# Patient Record
Sex: Female | Born: 1935 | Race: White | Hispanic: No | Marital: Married | Smoking: Never smoker
Health system: Southern US, Community
[De-identification: ages and names within clinical notes are randomized; demographics above are authoritative.]

## PROBLEM LIST (undated history)

## (undated) DIAGNOSIS — N183 Chronic kidney disease, stage 3 (moderate): Secondary | ICD-10-CM

## (undated) DIAGNOSIS — J189 Pneumonia, unspecified organism: Secondary | ICD-10-CM

## (undated) DIAGNOSIS — I10 Essential (primary) hypertension: Secondary | ICD-10-CM

## (undated) DIAGNOSIS — E039 Hypothyroidism, unspecified: Secondary | ICD-10-CM

## (undated) DIAGNOSIS — E079 Disorder of thyroid, unspecified: Secondary | ICD-10-CM

## (undated) DIAGNOSIS — D509 Iron deficiency anemia, unspecified: Secondary | ICD-10-CM

## (undated) DIAGNOSIS — I519 Heart disease, unspecified: Secondary | ICD-10-CM

## (undated) DIAGNOSIS — B3781 Candidal esophagitis: Secondary | ICD-10-CM

## (undated) DIAGNOSIS — I5042 Chronic combined systolic (congestive) and diastolic (congestive) heart failure: Secondary | ICD-10-CM

## (undated) DIAGNOSIS — E785 Hyperlipidemia, unspecified: Secondary | ICD-10-CM

## (undated) HISTORY — PX: TONSILLECTOMY: SUR1361

## (undated) HISTORY — PX: WASH SINUS: SUR1443

## (undated) HISTORY — DX: Iron deficiency anemia, unspecified: D50.9

## (undated) HISTORY — DX: Chronic combined systolic (congestive) and diastolic (congestive) heart failure: I50.42

## (undated) HISTORY — DX: Hypothyroidism, unspecified: E03.9

## (undated) HISTORY — DX: Pneumonia, unspecified organism: J18.9

## (undated) HISTORY — DX: Heart disease, unspecified: I51.9

---

## 1999-02-10 ENCOUNTER — Other Ambulatory Visit: Admission: RE | Admit: 1999-02-10 | Discharge: 1999-02-10 | Payer: Self-pay | Admitting: Radiology

## 2002-08-27 ENCOUNTER — Ambulatory Visit (HOSPITAL_COMMUNITY): Admission: RE | Admit: 2002-08-27 | Discharge: 2002-08-27 | Payer: Self-pay | Admitting: Family Medicine

## 2002-08-27 ENCOUNTER — Encounter: Payer: Self-pay | Admitting: Family Medicine

## 2004-02-25 ENCOUNTER — Ambulatory Visit (HOSPITAL_COMMUNITY): Admission: RE | Admit: 2004-02-25 | Discharge: 2004-02-25 | Payer: Self-pay | Admitting: Family Medicine

## 2004-09-18 ENCOUNTER — Emergency Department (HOSPITAL_COMMUNITY): Admission: EM | Admit: 2004-09-18 | Discharge: 2004-09-18 | Payer: Self-pay | Admitting: *Deleted

## 2005-05-17 ENCOUNTER — Ambulatory Visit (HOSPITAL_COMMUNITY): Admission: RE | Admit: 2005-05-17 | Discharge: 2005-05-17 | Payer: Self-pay | Admitting: Family Medicine

## 2005-05-25 ENCOUNTER — Ambulatory Visit: Payer: Self-pay | Admitting: Orthopedic Surgery

## 2005-10-05 ENCOUNTER — Ambulatory Visit (HOSPITAL_COMMUNITY): Admission: RE | Admit: 2005-10-05 | Discharge: 2005-10-05 | Payer: Self-pay | Admitting: Family Medicine

## 2007-04-22 ENCOUNTER — Ambulatory Visit (HOSPITAL_COMMUNITY): Admission: RE | Admit: 2007-04-22 | Discharge: 2007-04-22 | Payer: Self-pay | Admitting: Family Medicine

## 2008-04-22 ENCOUNTER — Ambulatory Visit (HOSPITAL_COMMUNITY): Admission: RE | Admit: 2008-04-22 | Discharge: 2008-04-22 | Payer: Self-pay | Admitting: Family Medicine

## 2009-04-26 ENCOUNTER — Ambulatory Visit (HOSPITAL_COMMUNITY): Admission: RE | Admit: 2009-04-26 | Discharge: 2009-04-26 | Payer: Self-pay | Admitting: Family Medicine

## 2010-05-19 ENCOUNTER — Ambulatory Visit (HOSPITAL_COMMUNITY)
Admission: RE | Admit: 2010-05-19 | Discharge: 2010-05-19 | Payer: Self-pay | Source: Home / Self Care | Attending: Family Medicine | Admitting: Family Medicine

## 2011-06-05 ENCOUNTER — Other Ambulatory Visit (HOSPITAL_COMMUNITY): Payer: Self-pay | Admitting: Family Medicine

## 2011-06-05 DIAGNOSIS — Z139 Encounter for screening, unspecified: Secondary | ICD-10-CM

## 2011-06-07 ENCOUNTER — Ambulatory Visit (HOSPITAL_COMMUNITY)
Admission: RE | Admit: 2011-06-07 | Discharge: 2011-06-07 | Disposition: A | Discharge status: Home / Self Care | Payer: Medicare Other | Source: Ambulatory Visit | Attending: Family Medicine | Admitting: Family Medicine

## 2011-06-07 DIAGNOSIS — Z1231 Encounter for screening mammogram for malignant neoplasm of breast: Secondary | ICD-10-CM | POA: Insufficient documentation

## 2011-06-07 DIAGNOSIS — Z139 Encounter for screening, unspecified: Secondary | ICD-10-CM

## 2011-09-18 ENCOUNTER — Ambulatory Visit (HOSPITAL_COMMUNITY)
Admission: RE | Admit: 2011-09-18 | Discharge: 2011-09-18 | Disposition: A | Discharge status: Home / Self Care | Payer: Medicare Other | Source: Ambulatory Visit | Attending: Family Medicine | Admitting: Family Medicine

## 2011-09-18 ENCOUNTER — Other Ambulatory Visit (HOSPITAL_COMMUNITY): Payer: Self-pay | Admitting: Family Medicine

## 2011-09-18 DIAGNOSIS — IMO0002 Reserved for concepts with insufficient information to code with codable children: Secondary | ICD-10-CM | POA: Insufficient documentation

## 2011-09-18 DIAGNOSIS — M25569 Pain in unspecified knee: Secondary | ICD-10-CM | POA: Insufficient documentation

## 2011-09-18 DIAGNOSIS — M1710 Unilateral primary osteoarthritis, unspecified knee: Secondary | ICD-10-CM | POA: Insufficient documentation

## 2013-05-20 ENCOUNTER — Ambulatory Visit (HOSPITAL_COMMUNITY)
Admission: RE | Admit: 2013-05-20 | Discharge: 2013-05-20 | Disposition: A | Discharge status: Home / Self Care | Payer: Medicare Other | Source: Ambulatory Visit | Attending: Family Medicine | Admitting: Family Medicine

## 2013-05-20 ENCOUNTER — Other Ambulatory Visit (HOSPITAL_COMMUNITY): Payer: Self-pay | Admitting: Family Medicine

## 2013-05-20 DIAGNOSIS — M25569 Pain in unspecified knee: Secondary | ICD-10-CM | POA: Insufficient documentation

## 2013-05-20 DIAGNOSIS — M1990 Unspecified osteoarthritis, unspecified site: Secondary | ICD-10-CM

## 2013-05-20 DIAGNOSIS — M1710 Unilateral primary osteoarthritis, unspecified knee: Secondary | ICD-10-CM | POA: Insufficient documentation

## 2014-02-10 ENCOUNTER — Other Ambulatory Visit: Payer: Self-pay | Admitting: Family Medicine

## 2014-02-10 DIAGNOSIS — M545 Low back pain: Secondary | ICD-10-CM

## 2014-02-10 DIAGNOSIS — M79605 Pain in left leg: Principal | ICD-10-CM

## 2014-02-19 ENCOUNTER — Ambulatory Visit
Admission: RE | Admit: 2014-02-19 | Discharge: 2014-02-19 | Disposition: A | Discharge status: Home / Self Care | Payer: Medicare Other | Source: Ambulatory Visit | Attending: Family Medicine | Admitting: Family Medicine

## 2014-02-19 DIAGNOSIS — M545 Low back pain, unspecified: Secondary | ICD-10-CM

## 2014-02-19 DIAGNOSIS — M79605 Pain in left leg: Principal | ICD-10-CM

## 2015-05-07 ENCOUNTER — Other Ambulatory Visit (HOSPITAL_COMMUNITY): Payer: Self-pay | Admitting: Family Medicine

## 2015-05-07 DIAGNOSIS — M542 Cervicalgia: Secondary | ICD-10-CM

## 2015-05-11 ENCOUNTER — Ambulatory Visit (HOSPITAL_COMMUNITY)
Admission: RE | Admit: 2015-05-11 | Discharge: 2015-05-11 | Disposition: A | Discharge status: Home / Self Care | Payer: Medicare Other | Source: Ambulatory Visit | Attending: Family Medicine | Admitting: Family Medicine

## 2015-05-11 DIAGNOSIS — M542 Cervicalgia: Secondary | ICD-10-CM | POA: Insufficient documentation

## 2015-05-11 DIAGNOSIS — M4802 Spinal stenosis, cervical region: Secondary | ICD-10-CM | POA: Insufficient documentation

## 2015-05-11 DIAGNOSIS — M4322 Fusion of spine, cervical region: Secondary | ICD-10-CM | POA: Diagnosis not present

## 2015-05-19 ENCOUNTER — Ambulatory Visit (HOSPITAL_COMMUNITY): Payer: Medicare Other

## 2015-07-07 ENCOUNTER — Ambulatory Visit (HOSPITAL_COMMUNITY)
Admission: RE | Admit: 2015-07-07 | Discharge: 2015-07-07 | Disposition: A | Discharge status: Home / Self Care | Payer: Medicare Other | Source: Ambulatory Visit | Attending: Family Medicine | Admitting: Family Medicine

## 2015-07-07 ENCOUNTER — Other Ambulatory Visit (HOSPITAL_COMMUNITY): Payer: Self-pay | Admitting: Family Medicine

## 2015-07-07 DIAGNOSIS — M719 Bursopathy, unspecified: Secondary | ICD-10-CM | POA: Insufficient documentation

## 2015-07-07 DIAGNOSIS — M19011 Primary osteoarthritis, right shoulder: Secondary | ICD-10-CM

## 2016-05-18 ENCOUNTER — Other Ambulatory Visit (HOSPITAL_COMMUNITY): Payer: Self-pay | Admitting: Preventative Medicine

## 2016-05-18 DIAGNOSIS — R9389 Abnormal findings on diagnostic imaging of other specified body structures: Secondary | ICD-10-CM

## 2016-05-25 ENCOUNTER — Ambulatory Visit (HOSPITAL_COMMUNITY)
Admission: RE | Admit: 2016-05-25 | Discharge: 2016-05-25 | Disposition: A | Discharge status: Home / Self Care | Payer: Medicare Other | Source: Ambulatory Visit | Attending: Preventative Medicine | Admitting: Preventative Medicine

## 2016-05-25 DIAGNOSIS — C3431 Malignant neoplasm of lower lobe, right bronchus or lung: Secondary | ICD-10-CM | POA: Diagnosis not present

## 2016-05-25 DIAGNOSIS — J479 Bronchiectasis, uncomplicated: Secondary | ICD-10-CM | POA: Insufficient documentation

## 2016-05-25 DIAGNOSIS — R9389 Abnormal findings on diagnostic imaging of other specified body structures: Secondary | ICD-10-CM

## 2016-05-25 DIAGNOSIS — R938 Abnormal findings on diagnostic imaging of other specified body structures: Secondary | ICD-10-CM | POA: Insufficient documentation

## 2016-06-12 ENCOUNTER — Emergency Department (HOSPITAL_COMMUNITY): Payer: Medicare Other

## 2016-06-12 ENCOUNTER — Inpatient Hospital Stay (HOSPITAL_COMMUNITY)
Admission: EM | Admit: 2016-06-12 | Discharge: 2016-06-15 | DRG: 812 | Disposition: A | Discharge status: Home / Self Care | Payer: Medicare Other | Attending: Family Medicine | Admitting: Family Medicine

## 2016-06-12 ENCOUNTER — Encounter (HOSPITAL_COMMUNITY): Payer: Self-pay | Admitting: Emergency Medicine

## 2016-06-12 DIAGNOSIS — D649 Anemia, unspecified: Secondary | ICD-10-CM | POA: Diagnosis present

## 2016-06-12 DIAGNOSIS — N179 Acute kidney failure, unspecified: Secondary | ICD-10-CM | POA: Diagnosis present

## 2016-06-12 DIAGNOSIS — M6281 Muscle weakness (generalized): Secondary | ICD-10-CM

## 2016-06-12 DIAGNOSIS — R531 Weakness: Secondary | ICD-10-CM

## 2016-06-12 DIAGNOSIS — E860 Dehydration: Secondary | ICD-10-CM | POA: Diagnosis present

## 2016-06-12 DIAGNOSIS — D509 Iron deficiency anemia, unspecified: Principal | ICD-10-CM

## 2016-06-12 DIAGNOSIS — Z79899 Other long term (current) drug therapy: Secondary | ICD-10-CM

## 2016-06-12 DIAGNOSIS — J069 Acute upper respiratory infection, unspecified: Secondary | ICD-10-CM | POA: Diagnosis present

## 2016-06-12 DIAGNOSIS — K208 Other esophagitis: Secondary | ICD-10-CM | POA: Diagnosis present

## 2016-06-12 DIAGNOSIS — D75839 Thrombocytosis, unspecified: Secondary | ICD-10-CM | POA: Diagnosis present

## 2016-06-12 DIAGNOSIS — K648 Other hemorrhoids: Secondary | ICD-10-CM | POA: Diagnosis present

## 2016-06-12 DIAGNOSIS — B3781 Candidal esophagitis: Secondary | ICD-10-CM

## 2016-06-12 DIAGNOSIS — K2940 Chronic atrophic gastritis without bleeding: Secondary | ICD-10-CM | POA: Diagnosis present

## 2016-06-12 DIAGNOSIS — E538 Deficiency of other specified B group vitamins: Secondary | ICD-10-CM | POA: Diagnosis present

## 2016-06-12 DIAGNOSIS — E785 Hyperlipidemia, unspecified: Secondary | ICD-10-CM | POA: Diagnosis present

## 2016-06-12 DIAGNOSIS — K222 Esophageal obstruction: Secondary | ICD-10-CM | POA: Diagnosis present

## 2016-06-12 DIAGNOSIS — I10 Essential (primary) hypertension: Secondary | ICD-10-CM | POA: Diagnosis present

## 2016-06-12 DIAGNOSIS — Q438 Other specified congenital malformations of intestine: Secondary | ICD-10-CM

## 2016-06-12 DIAGNOSIS — D473 Essential (hemorrhagic) thrombocythemia: Secondary | ICD-10-CM | POA: Diagnosis present

## 2016-06-12 DIAGNOSIS — E039 Hypothyroidism, unspecified: Secondary | ICD-10-CM | POA: Diagnosis present

## 2016-06-12 HISTORY — DX: Essential (primary) hypertension: I10

## 2016-06-12 HISTORY — DX: Disorder of thyroid, unspecified: E07.9

## 2016-06-12 HISTORY — DX: Hyperlipidemia, unspecified: E78.5

## 2016-06-12 LAB — CBC
HCT: 25.5 % — ABNORMAL LOW (ref 36.0–46.0)
Hemoglobin: 7.9 g/dL — ABNORMAL LOW (ref 12.0–15.0)
MCH: 24.2 pg — ABNORMAL LOW (ref 26.0–34.0)
MCHC: 31 g/dL (ref 30.0–36.0)
MCV: 78 fL (ref 78.0–100.0)
PLATELETS: 519 10*3/uL — AB (ref 150–400)
RBC: 3.27 MIL/uL — ABNORMAL LOW (ref 3.87–5.11)
RDW: 16.7 % — AB (ref 11.5–15.5)
WBC: 7.2 10*3/uL (ref 4.0–10.5)

## 2016-06-12 LAB — URINALYSIS, ROUTINE W REFLEX MICROSCOPIC
BILIRUBIN URINE: NEGATIVE
Glucose, UA: NEGATIVE mg/dL
Hgb urine dipstick: NEGATIVE
Ketones, ur: NEGATIVE mg/dL
Nitrite: NEGATIVE
PH: 6 (ref 5.0–8.0)
Protein, ur: NEGATIVE mg/dL
SPECIFIC GRAVITY, URINE: 1.017 (ref 1.005–1.030)

## 2016-06-12 LAB — IRON AND TIBC
Iron: 7 ug/dL — ABNORMAL LOW (ref 28–170)
SATURATION RATIOS: 2 % — AB (ref 10.4–31.8)
TIBC: 420 ug/dL (ref 250–450)
UIBC: 413 ug/dL

## 2016-06-12 LAB — COMPREHENSIVE METABOLIC PANEL
ALBUMIN: 3.4 g/dL — AB (ref 3.5–5.0)
ALK PHOS: 56 U/L (ref 38–126)
ALT: 13 U/L — AB (ref 14–54)
AST: 18 U/L (ref 15–41)
Anion gap: 8 (ref 5–15)
BILIRUBIN TOTAL: 0.4 mg/dL (ref 0.3–1.2)
BUN: 26 mg/dL — AB (ref 6–20)
CALCIUM: 9.1 mg/dL (ref 8.9–10.3)
CO2: 25 mmol/L (ref 22–32)
CREATININE: 1.3 mg/dL — AB (ref 0.44–1.00)
Chloride: 103 mmol/L (ref 101–111)
GFR calc Af Amer: 41 mL/min — ABNORMAL LOW (ref >60)
GFR calc non Af Amer: 35 mL/min — ABNORMAL LOW (ref >60)
GLUCOSE: 117 mg/dL — AB (ref 65–99)
Potassium: 4.1 mmol/L (ref 3.5–5.1)
SODIUM: 136 mmol/L (ref 135–145)
TOTAL PROTEIN: 6.2 g/dL — AB (ref 6.5–8.1)

## 2016-06-12 LAB — VITAMIN B12: Vitamin B-12: 155 pg/mL — ABNORMAL LOW (ref 180–914)

## 2016-06-12 LAB — FERRITIN: Ferritin: 6 ng/mL — ABNORMAL LOW (ref 11–307)

## 2016-06-12 LAB — PREPARE RBC (CROSSMATCH)

## 2016-06-12 LAB — FOLATE: FOLATE: 25.2 ng/mL (ref >5.9)

## 2016-06-12 LAB — ABO/RH: ABO/RH(D): O POS

## 2016-06-12 LAB — RETICULOCYTES
RBC.: 3.05 MIL/uL — AB (ref 3.87–5.11)
RETIC COUNT ABSOLUTE: 51.9 10*3/uL (ref 19.0–186.0)
Retic Ct Pct: 1.7 % (ref 0.4–3.1)

## 2016-06-12 LAB — TSH: TSH: 3.339 u[IU]/mL (ref 0.350–4.500)

## 2016-06-12 MED ORDER — GABAPENTIN 300 MG PO CAPS
300.0000 mg | ORAL_CAPSULE | Freq: Every day | ORAL | Status: DC
  Administered 2016-06-12 – 2016-06-14 (×3): 300 mg via ORAL
  Filled 2016-06-12 (×3): qty 1

## 2016-06-12 MED ORDER — BISACODYL 5 MG PO TBEC
5.0000 mg | DELAYED_RELEASE_TABLET | Freq: Every day | ORAL | Status: DC | PRN
  Filled 2016-06-12 (×2): qty 1

## 2016-06-12 MED ORDER — ACETAMINOPHEN 650 MG RE SUPP: 650.0000 mg | Freq: Four times a day (QID) | RECTAL | Status: DC | PRN

## 2016-06-12 MED ORDER — HEPARIN SODIUM (PORCINE) 5000 UNIT/ML IJ SOLN
5000.0000 [IU] | Freq: Three times a day (TID) | INTRAMUSCULAR | Status: AC
  Administered 2016-06-12 – 2016-06-13 (×4): 5000 [IU] via SUBCUTANEOUS
  Filled 2016-06-12 (×4): qty 1

## 2016-06-12 MED ORDER — SODIUM CHLORIDE 0.9 % IV SOLN
10.0000 mL/h | Freq: Once | INTRAVENOUS | Status: AC
  Administered 2016-06-12: 10 mL/h via INTRAVENOUS

## 2016-06-12 MED ORDER — ACETAMINOPHEN 325 MG PO TABS: 650.0000 mg | ORAL_TABLET | Freq: Four times a day (QID) | ORAL | Status: DC | PRN

## 2016-06-12 MED ORDER — TRAZODONE HCL 50 MG PO TABS
25.0000 mg | ORAL_TABLET | Freq: Every evening | ORAL | Status: DC | PRN
  Administered 2016-06-12 – 2016-06-13 (×2): 25 mg via ORAL
  Filled 2016-06-12 (×2): qty 1

## 2016-06-12 MED ORDER — PHENOL 1.4 % MT LIQD
1.0000 | OROMUCOSAL | Status: DC | PRN
  Administered 2016-06-12 – 2016-06-14 (×2): 1 via OROMUCOSAL
  Filled 2016-06-12 (×2): qty 177

## 2016-06-12 MED ORDER — PRAVASTATIN SODIUM 40 MG PO TABS
40.0000 mg | ORAL_TABLET | Freq: Every day | ORAL | Status: DC
  Administered 2016-06-12 – 2016-06-15 (×4): 40 mg via ORAL
  Filled 2016-06-12 (×4): qty 1

## 2016-06-12 MED ORDER — ONDANSETRON HCL 4 MG/2ML IJ SOLN: 4.0000 mg | Freq: Four times a day (QID) | INTRAMUSCULAR | Status: DC | PRN

## 2016-06-12 MED ORDER — ONDANSETRON HCL 4 MG PO TABS: 4.0000 mg | ORAL_TABLET | Freq: Four times a day (QID) | ORAL | Status: DC | PRN

## 2016-06-12 MED ORDER — SODIUM CHLORIDE 0.9 % IV SOLN
INTRAVENOUS | Status: AC
  Administered 2016-06-12: 18:00:00 via INTRAVENOUS

## 2016-06-12 MED ORDER — LEVOTHYROXINE SODIUM 50 MCG PO TABS
50.0000 ug | ORAL_TABLET | Freq: Every day | ORAL | Status: DC
Start: 2016-06-13 — End: 2016-06-15
  Administered 2016-06-13 – 2016-06-15 (×3): 50 ug via ORAL
  Filled 2016-06-12 (×3): qty 1

## 2016-06-12 NOTE — ED Triage Notes (Signed)
PT states she was on recent antibiotics for pneumonia around 3 weeks ago and since then has had increased generalized weakness and sore throat as well. PT stated low hemoglobin at her PCP office last week and she was supposed to return back to office today for more blood work but the office is closed today d/t frozen water pipes.

## 2016-06-12 NOTE — H&P (Signed)
History and Physical  Katelyn Daniel:[id number] DOB: Jan 12, 1924 DOA: 06/12/2016  Referring physician: Stark Jock, MD PCP: [name], MD   Chief Complaint: weakness  HPI: Katelyn Daniel is a 83 y.o. female who has been having symptoms of progressive weakness for the past several weeks.  She has been followed for this by her PCP and reports that she was told that she had anemia. She denies black stools.  She was Hemoccult negative. She says that she also has been having sore throat and reports that sensation that she feels like she is smothering when lying recumbent. She reports shortness of breath with ambulation and weakness. She reports that her symptoms have progressed over the past week. She had been scheduled to see her PCP in the office today however the office is closed.  She denies having chest pain. She was evaluated in the emergency department and found to have a hemoglobin of 7.9. She also was noted to have an elevated BUN and creatinine. Given the progressive nature of her symptoms, she will be admitted for observation and blood transfusion.  Review of Systems: All systems reviewed and apart from history of presenting illness, are negative.  Past Medical History:  Diagnosis Date  . Hyperlipemia   . Hypertension   . Thyroid disease    Past Surgical History:  Procedure Laterality Date  . TONSILLECTOMY    . [facility] SINUS     Social History:  reports that she has never smoked. She has never used smokeless tobacco. She reports that she does not drink alcohol or use drugs.  No Known Allergies  History reviewed. No pertinent family history.   Current Meds  Medication Sig  . gabapentin (NEURONTIN) 300 MG capsule Take 300 mg by mouth at bedtime.  Marland Kitchen levothyroxine (SYNTHROID, LEVOTHROID) 50 MCG tablet Take 50 mcg by mouth daily.  Marland Kitchen lisinopril-hydrochlorothiazide (PRINZIDE,ZESTORETIC) 20-12.5 MG tablet Take 1 tablet by mouth daily.  . Multiple Vitamins-Minerals (PRESERVISION  AREDS PO) Take 1 capsule by mouth daily.  . pravastatin (PRAVACHOL) 40 MG tablet Take 40 mg by mouth daily.    Physical Exam: Vitals:   06/12/16 1036 06/12/16 1037 06/12/16 1322  BP: (!) 122/49  137/74  Pulse: 102  119  Resp: 20  20  Temp: 97.8 F (36.6 C)  97.9 F (36.6 C)  TempSrc: Oral  Oral  SpO2: 100%  96%  Weight:  65.3 kg (144 lb)   Height:  5' 7.5" (1.715 m)      General exam: Moderately built and nourished patient, lying comfortably supine on the gurney in no obvious distress.  Head, eyes and ENT: Nontraumatic and normocephalic. Pupils equally reacting to light and accommodation. Oral mucosa dry.  Neck: Supple. No JVD, carotid bruit or thyromegaly.  Lymphatics: No lymphadenopathy.  Respiratory system: Clear to auscultation. No increased work of breathing.  Cardiovascular system: S1 and S2 heard. No JVD, murmurs, gallops, clicks or pedal edema.  Gastrointestinal system: Abdomen is nondistended, soft and nontender. Normal bowel sounds heard. No organomegaly or masses appreciated.  Central nervous system: Alert and oriented. No focal neurological deficits.  Extremities: Symmetric 5 x 5 power. Peripheral pulses symmetrically felt.   Skin: No rashes or acute findings.  Musculoskeletal system: Negative exam.  Psychiatry: Pleasant and cooperative.   Labs on Admission:  Basic Metabolic Panel:  Recent Labs Lab 06/12/16 1111  NA 136  K 4.1  CL 103  CO2 25  GLUCOSE 117*  BUN 26*  CREATININE 1.30*  CALCIUM  9.1   Liver Function Tests:  Recent Labs Lab 06/12/16 1111  AST 18  ALT 13*  ALKPHOS 56  BILITOT 0.4  PROT 6.2*  ALBUMIN 3.4*   No results for input(s): LIPASE, AMYLASE in the last 168 hours. No results for input(s): AMMONIA in the last 168 hours. CBC:  Recent Labs Lab 06/12/16 1111  WBC 7.2  HGB 7.9*  HCT 25.5*  MCV 78.0  PLT 519*   Cardiac Enzymes: No results for input(s): CKTOTAL, CKMB, CKMBINDEX, TROPONINI in the last 168  hours.  BNP (last 3 results) No results for input(s): PROBNP in the last 8760 hours. CBG: No results for input(s): GLUCAP in the last 168 hours.  Radiological Exams on Admission: Dg Chest 2 View  Result Date: 06/12/2016 CLINICAL DATA:  Cough, weakness EXAM: CHEST  2 VIEW COMPARISON:  CT chest 05/25/2016 FINDINGS: There is no focal parenchymal opacity. There is no pleural effusion or pneumothorax. The heart and mediastinal contours are unremarkable. There is thoracic aortic atherosclerosis.The osseous structures are unremarkable. IMPRESSION: No active cardiopulmonary disease. Electronically Signed   By: Kathreen Devoid   On: 06/12/2016 11:09   EKG: Independently reviewed. Assessment/Plan Active Problems:   Generalized weakness   Symptomatic anemia   AKI (acute kidney injury) ([place])   Mild dehydration  1. Generalized weakness-symptoms have progressed over the past several weeks. She does have a treatable anemia. She will be treated with 2 units of packed red blood cells. We will check anemia panel. Check TSH, vitamin D, B12 level. In addition, check EKG. Also, given her sensation of smothering will get an echocardiogram. I'm concerned about heart failure. Consult PT for evaluation. 2. Acute kidney injury-I do not have any old labs to compare, patient reports that she has no history of renal insufficiency. Holding lisinopril at this time. Gently hydrating with IV fluids. Repeat BMP in the morning. 3. Mild upper respiratory infection-given her symptoms suspect this is a viral URI and symptomatic treatment ordered. 4. Essential hypertension-holding lisinopril at this time given acute renal insufficiency, monitor blood pressure and treat as needed. 5. Symptomatic anemia-transfuse 2 units packed red blood cells, repeat CBC in the morning.  Dr. [name] will assume care of patient in the morning.    DVT Prophylaxis: heparin Code Status: full  Family Communication: bedside  Disposition Plan: home     Time spent: 46 mins  Irwin Brakeman, MD [facility] Pager [phone]  If 7PM-7AM, please contact night-coverage [email or web address] Password [email or web address] 06/12/2016, 3:12 PM

## 2016-06-12 NOTE — ED Provider Notes (Signed)
[name] DEPT Provider Note   CSN: [id number] Arrival date & time: 06/12/16  1015  By signing my name below, I, Sonum Patel, attest that this documentation has been prepared under the direction and in the presence of Veryl Speak, MD. Electronically Signed: Sonum Patel, Education administrator. 06/12/16. 1:29 PM.  History   Chief Complaint Chief Complaint  Patient presents with  . Weakness    The history is provided by the patient. No language interpreter was used.  Weakness  Primary symptoms comment: generalized. This is a new problem. The current episode started more than 1 week ago. The problem has not changed since onset.There was no focality noted. There has been no fever.     HPI Comments: Katelyn Daniel is a 83 y.o. female who presents to the Emergency Department complaining of unchanged generalized weakness for the past few weeks with associated sore throat and throat congestion for the last few days. She states her weakness is alleviated with rest and worsened with ambulation which causes her to get shaky and dizzy. She was recently on antibiotics for PNA about 3 weeks ago. She was seen by PCP last week with low hemoglobin and was to return today for more testing. She denies history of anemia or GI bleeds. She denies fever, melena, hematochezia, CP.    Past Medical History:  Diagnosis Date  . Hyperlipemia   . Hypertension   . Thyroid disease     There are no active problems to display for this patient.   Past Surgical History:  Procedure Laterality Date  . TONSILLECTOMY    . [place] SINUS      OB History    Gravida Para Term Preterm AB Living             2   SAB TAB Ectopic Multiple Live Births                   Home Medications    Prior to Admission medications   Not on File    Family History History reviewed. No pertinent family history.  Social History Social History  Substance Use Topics  . Smoking status: Never Smoker  . Smokeless tobacco: Never Used  .  Alcohol use No     Allergies   Patient has no known allergies.   Review of Systems Review of Systems  A complete 10 system review of systems was obtained and all systems are negative except as noted in the HPI and PMH.    Physical Exam Updated Vital Signs BP (!) 122/49 (BP Location: Right Arm)   Pulse 102   Temp 97.8 F (36.6 C) (Oral)   Resp 20   Ht 5' 7.5" (1.715 m)   Wt 144 lb (65.3 kg)   SpO2 100%   BMI 22.22 kg/m   Physical Exam  Constitutional: She is oriented to person, place, and time. She appears well-developed and well-nourished. No distress.  HENT:  Head: Normocephalic and atraumatic.  Eyes: EOM are normal.  Neck: Normal range of motion.  Cardiovascular: Normal rate, regular rhythm and normal heart sounds.   Pulmonary/Chest: Effort normal and breath sounds normal.  Abdominal: Soft. She exhibits no distension. There is no tenderness.  Genitourinary: Rectal exam shows no external hemorrhoid and no mass.  Genitourinary Comments: Brown stool. No masses, no hemorrhoids.   Musculoskeletal: Normal range of motion.  Neurological: She is alert and oriented to person, place, and time.  Skin: Skin is warm and dry.  Psychiatric: She has a normal  mood and affect. Judgment normal.  Nursing note and vitals reviewed.    ED Treatments / Results  DIAGNOSTIC STUDIES: Oxygen Saturation is 100% on RA, normal by my interpretation.    COORDINATION OF CARE: 1:31 PM Discussed treatment plan with pt at bedside and pt agreed to plan.   Labs (all labs ordered are listed, but only abnormal results are displayed) Labs Reviewed  CBC - Abnormal; Notable for the following:       Result Value   RBC 3.27 (*)    Hemoglobin 7.9 (*)    HCT 25.5 (*)    MCH 24.2 (*)    RDW 16.7 (*)    Platelets 519 (*)    All other components within normal limits  COMPREHENSIVE METABOLIC PANEL - Abnormal; Notable for the following:    Glucose, Bld 117 (*)    BUN 26 (*)    Creatinine, Ser 1.30  (*)    Total Protein 6.2 (*)    Albumin 3.4 (*)    ALT 13 (*)    GFR calc non Af Amer 35 (*)    GFR calc Af Amer 41 (*)    All other components within normal limits  URINALYSIS, ROUTINE W REFLEX MICROSCOPIC    EKG  EKG Interpretation None       Radiology Dg Chest 2 View  Result Date: 06/12/2016 CLINICAL DATA:  Cough, weakness EXAM: CHEST  2 VIEW COMPARISON:  CT chest 05/25/2016 FINDINGS: There is no focal parenchymal opacity. There is no pleural effusion or pneumothorax. The heart and mediastinal contours are unremarkable. There is thoracic aortic atherosclerosis.The osseous structures are unremarkable. IMPRESSION: No active cardiopulmonary disease. Electronically Signed   By: Kathreen Devoid   On: 06/12/2016 11:09    Procedures Procedures (including critical care time)  Medications Ordered in ED Medications - No data to display   Initial Impression / Assessment and Plan / ED Course  I have reviewed the triage vital signs and the nursing notes.  Pertinent labs & imaging results that were available during my care of the patient were reviewed by me and considered in my medical decision making (see chart for details).  Clinical Course     Patient presents with complaints of weakness that has worsened over the past 1-2 weeks. She reports that she was recently treated for pneumonia with antibiotics, and has felt weak since. Her workup today reveals a hemoglobin of 7.9 with heme-negative stools. Her indices do not favor iron deficiency or B12 deficiency. The etiology of the anemia I am uncertain, however she is symptomatic with this and states she cannot walk across the room without becoming lightheaded and short of breath.  I've discussed this case with Dr. Wynetta Emery from the hospitalist service who agrees to admit. She will be transfused and observed.  Final Clinical Impressions(s) / ED Diagnoses   Final diagnoses:  None    New Prescriptions New Prescriptions   No medications  on file   I personally performed the services described in this documentation, which was scribed in my presence. The recorded information has been reviewed and is accurate.        Veryl Speak, MD 06/12/16 [phone]

## 2016-06-13 ENCOUNTER — Encounter (HOSPITAL_COMMUNITY): Payer: Self-pay | Admitting: Gastroenterology

## 2016-06-13 ENCOUNTER — Observation Stay (HOSPITAL_BASED_OUTPATIENT_CLINIC_OR_DEPARTMENT_OTHER): Payer: Medicare Other

## 2016-06-13 DIAGNOSIS — R0600 Dyspnea, unspecified: Secondary | ICD-10-CM

## 2016-06-13 LAB — ECHOCARDIOGRAM COMPLETE
HEIGHTINCHES: 67.5 in
WEIGHTICAEL: 2304 [oz_av]

## 2016-06-13 LAB — BASIC METABOLIC PANEL
Anion gap: 6 (ref 5–15)
BUN: 21 mg/dL — AB (ref 6–20)
CALCIUM: 8.5 mg/dL — AB (ref 8.9–10.3)
CO2: 24 mmol/L (ref 22–32)
CREATININE: 1.1 mg/dL — AB (ref 0.44–1.00)
Chloride: 106 mmol/L (ref 101–111)
GFR calc Af Amer: 50 mL/min — ABNORMAL LOW (ref >60)
GFR, EST NON AFRICAN AMERICAN: 43 mL/min — AB (ref >60)
GLUCOSE: 91 mg/dL (ref 65–99)
Potassium: 3.7 mmol/L (ref 3.5–5.1)
Sodium: 136 mmol/L (ref 135–145)

## 2016-06-13 LAB — CBC
HCT: 28.6 % — ABNORMAL LOW (ref 36.0–46.0)
HEMOGLOBIN: 9.3 g/dL — AB (ref 12.0–15.0)
MCH: 25.5 pg — AB (ref 26.0–34.0)
MCHC: 32.5 g/dL (ref 30.0–36.0)
MCV: 78.4 fL (ref 78.0–100.0)
Platelets: 428 10*3/uL — ABNORMAL HIGH (ref 150–400)
RBC: 3.65 MIL/uL — ABNORMAL LOW (ref 3.87–5.11)
RDW: 16.3 % — AB (ref 11.5–15.5)
WBC: 6.4 10*3/uL (ref 4.0–10.5)

## 2016-06-13 LAB — TYPE AND SCREEN
ABO/RH(D): O POS
Antibody Screen: NEGATIVE
Unit division: 0
Unit division: 0

## 2016-06-13 LAB — VITAMIN D 25 HYDROXY (VIT D DEFICIENCY, FRACTURES): Vit D, 25-Hydroxy: 77.1 ng/mL (ref 30.0–100.0)

## 2016-06-13 MED ORDER — BISACODYL 5 MG PO TBEC: 10.0000 mg | DELAYED_RELEASE_TABLET | Freq: Once | ORAL | Status: DC

## 2016-06-13 MED ORDER — CYANOCOBALAMIN 1000 MCG/ML IJ SOLN
1000.0000 ug | Freq: Every day | INTRAMUSCULAR | Status: AC
  Administered 2016-06-13 – 2016-06-15 (×3): 1000 ug via INTRAMUSCULAR
  Filled 2016-06-13 (×3): qty 1

## 2016-06-13 MED ORDER — POLYETHYLENE GLYCOL 3350 17 G PO PACK
17.0000 g | PACK | ORAL | Status: AC
  Administered 2016-06-13 (×5): 17 g via ORAL
  Filled 2016-06-13 (×5): qty 1

## 2016-06-13 MED ORDER — SODIUM CHLORIDE 0.9 % IV SOLN: INTRAVENOUS | Status: DC

## 2016-06-13 MED ORDER — SODIUM CHLORIDE 0.9 % IV SOLN
INTRAVENOUS | Status: DC
  Administered 2016-06-13 – 2016-06-14 (×2): via INTRAVENOUS

## 2016-06-13 MED ORDER — BISACODYL 5 MG PO TBEC
10.0000 mg | DELAYED_RELEASE_TABLET | Freq: Once | ORAL | Status: AC
  Administered 2016-06-14: 10 mg via ORAL
  Filled 2016-06-13: qty 2

## 2016-06-13 MED ORDER — BISACODYL 5 MG PO TBEC
10.0000 mg | DELAYED_RELEASE_TABLET | Freq: Once | ORAL | Status: AC
  Administered 2016-06-13: 10 mg via ORAL
  Filled 2016-06-13: qty 2

## 2016-06-13 NOTE — Care Management Note (Signed)
Case Management Note  Patient Details  Name: Katelyn Daniel MRN: [id number] Date of Birth: [birth year]  Subjective/Objective:                  Pt is from home, lives with her husband and is ind with ADL's. She has PCP, transportation to appointments and no difficulty affording or managing medications. She uses to DME with ambulation. She plans to return home with self care at DC. She states she has a long term care policy if she needs any help at home.   Action/Plan: No CM needs anticipated.   Expected Discharge Date:    06/17/2015              Expected Discharge Plan:  Home/Self Care  In-House Referral:  NA  Discharge planning Services  CM Consult  Post Acute Care Choice:  NA Choice offered to:  NA  Status of Service:  Completed, signed off  Sherald Barge, RN 06/13/2016, 8:49 AM

## 2016-06-13 NOTE — Consult Note (Signed)
Referring Provider: Dr. [name] Primary Care Physician:  [name], MD Primary Gastroenterologist:  Dr. Oneida Alar   Date of Admission: 06/12/16 Date of Consultation: 06/13/16  Reason for Consultation:   IDA  HPI:  Katelyn Daniel is a very pleasant 83 y.o. year old female who presented with worsening fatigue, shortness of breath, and admitted due to symptomatic anemia.   States she had pneumonia over the Christmas holidays and felt like she was slow to recover. Went to her PCP three days last week and was told last Friday that she was anemic. Unknown Hgb from Friday. Yesterday morning felt weak, fatigued. Didn't feel like she had the energy to put her dishes away in the dishwasher. States the worst thing about it was feeling like she was "smothering" and had no breath. Would get very shaky when walking from her head to her toes. Admitting Hgb 7.9.  Received 2 units PRBCs, with Hgb today 9.3. Heme negative per ED.   No abdominal pain, melena, hematochezia.  No constipation, diarrhea, changes in bowel habits. She does note that she has solid food and liquid dysphagia that gets "right here" and points to mid chest. Present a "good while". No weight loss. Has a wonderful appetite. Has been taking naproxen approximately once a day (but this is not on the med list). No anticoagulation.   Last colonoscopy by Dr. Gala Romney per patient at least 20 years ago. States she was told to have another one in 10 years.   Past Medical History:  Diagnosis Date  . Hyperlipemia   . Hypertension   . Thyroid disease     Past Surgical History:  Procedure Laterality Date  . TONSILLECTOMY    . [facility] SINUS      Prior to Admission medications   Medication Sig Start Date End Date Taking? Authorizing Provider  gabapentin (NEURONTIN) 300 MG capsule Take 300 mg by mouth at bedtime. 06/06/16  Yes Historical Provider, MD  levothyroxine (SYNTHROID, LEVOTHROID) 50 MCG tablet Take 50 mcg by mouth daily. 06/02/16  Yes  Historical Provider, MD  lisinopril-hydrochlorothiazide (PRINZIDE,ZESTORETIC) 20-12.5 MG tablet Take 1 tablet by mouth daily. 04/02/16  Yes Historical Provider, MD  Multiple Vitamins-Minerals (PRESERVISION AREDS PO) Take 1 capsule by mouth daily.   Yes Historical Provider, MD  pravastatin (PRAVACHOL) 40 MG tablet Take 40 mg by mouth daily. 03/16/16  Yes Historical Provider, MD    Current Facility-Administered Medications  Medication Dose Route Frequency Provider Last Rate Last Dose  . acetaminophen (TYLENOL) tablet 650 mg  650 mg Oral Q6H PRN Clanford Marisa Hua, MD       Or  . acetaminophen (TYLENOL) suppository 650 mg  650 mg Rectal Q6H PRN Clanford Marisa Hua, MD      . bisacodyl (DULCOLAX) EC tablet 5 mg  5 mg Oral Daily PRN Clanford Marisa Hua, MD      . cyanocobalamin ((VITAMIN B-12)) injection 1,000 mcg  1,000 mcg Intramuscular Daily Lucia Gaskins, MD   1,000 mcg at 06/13/16 1401  . gabapentin (NEURONTIN) capsule 300 mg  300 mg Oral QHS Clanford Marisa Hua, MD   300 mg at 06/12/16 2208  . heparin injection 5,000 Units  5,000 Units Subcutaneous Q8H Clanford Marisa Hua, MD   5,000 Units at 06/13/16 1401  . levothyroxine (SYNTHROID, LEVOTHROID) tablet 50 mcg  50 mcg Oral QAC breakfast Clanford Marisa Hua, MD   50 mcg at 06/13/16 0915  . ondansetron (ZOFRAN) tablet 4 mg  4 mg Oral Q6H PRN Clanford Marisa Hua, MD  Or  . ondansetron (ZOFRAN) injection 4 mg  4 mg Intravenous Q6H PRN Clanford L Johnson, MD      . phenol (CHLORASEPTIC) mouth spray 1 spray  1 spray Mouth/Throat PRN Clanford Marisa Hua, MD   1 spray at 06/12/16 2208  . pravastatin (PRAVACHOL) tablet 40 mg  40 mg Oral Daily Clanford Marisa Hua, MD   40 mg at 06/13/16 0915  . traZODone (DESYREL) tablet 25 mg  25 mg Oral QHS PRN Clanford Marisa Hua, MD   25 mg at 06/12/16 2208    Allergies as of 06/12/2016  . (No Known Allergies)    Family History  Problem Relation Age of Onset  . Colon cancer Neg Hx     Social History    Social History  . Marital status: Married    Spouse name: N/A  . Number of children: N/A  . Years of education: N/A   Occupational History  . Not on file.   Social History Main Topics  . Smoking status: Never Smoker  . Smokeless tobacco: Never Used  . Alcohol use No  . Drug use: No  . Sexual activity: Not on file   Other Topics Concern  . Not on file   Social History Narrative  . No narrative on file    Review of Systems: As mentioned in HPI   Physical Exam: Vital signs in last 24 hours: Temp:  [97.7 F (36.5 C)-99.2 F (37.3 C)] 98.3 F (36.8 C) (01/09 1409) Pulse Rate:  [76-87] 79 (01/09 1409) Resp:  [16-24] 18 (01/09 1409) BP: (128-158)/(71-85) 128/73 (01/09 1409) SpO2:  [95 %-100 %] 97 % (01/09 1409) Last BM Date: 06/12/16 General:   Alert,  Well-developed, well-nourished, pleasant and cooperative in NAD Head:  Normocephalic and atraumatic. Eyes:  Sclera clear, no icterus.   Conjunctiva pink. Ears:  Normal auditory acuity. Nose:  No deformity, discharge,  or lesions. Mouth:  No deformity or lesions, dentition normal. Lungs:  Clear throughout to auscultation.   No wheezes, crackles, or rhonchi. No acute distress. Heart:  Regular rate and rhythm; no murmurs, clicks, rubs,  or gallops. Abdomen:  Soft, nontender and nondistended. Normal bowel sounds. Liver margin palpable below right costal margin, smooth.  Rectal:  Deferred until time of colonoscopy.   Msk:  Symmetrical without gross deformities. Normal posture. Extremities:  Without  edema. Neurologic:  Alert and  oriented x4;  grossly normal neurologically. Psych:  Alert and cooperative. Normal mood and affect.  Intake/Output from previous day: 01/08 0701 - 01/09 0700 In: 901 [I.V.:100; Blood:801] Out: -  Intake/Output this shift: Total I/O In: 480 [P.O.:480] Out: -   Lab Results:  Recent Labs  06/12/16 1111 06/13/16 0606  WBC 7.2 6.4  HGB 7.9* 9.3*  HCT 25.5* 28.6*  PLT 519* 428*    BMET  Recent Labs  06/12/16 1111 06/13/16 0606  NA 136 136  K 4.1 3.7  CL 103 106  CO2 25 24  GLUCOSE 117* 91  BUN 26* 21*  CREATININE 1.30* 1.10*  CALCIUM 9.1 8.5*   LFT  Recent Labs  06/12/16 1111  PROT 6.2*  ALBUMIN 3.4*  AST 18  ALT 13*  ALKPHOS 56  BILITOT 0.4   Lab Results  Component Value Date   IRON 7 (L) 06/12/2016   TIBC 420 06/12/2016   FERRITIN 6 (L) 06/12/2016      Studies/Results: Dg Chest 2 View  Result Date: 06/12/2016 CLINICAL DATA:  Cough, weakness EXAM: CHEST  2 VIEW COMPARISON:  CT chest 05/25/2016 FINDINGS: There is no focal parenchymal opacity. There is no pleural effusion or pneumothorax. The heart and mediastinal contours are unremarkable. There is thoracic aortic atherosclerosis.The osseous structures are unremarkable. IMPRESSION: No active cardiopulmonary disease. Electronically Signed   By: Kathreen Devoid   On: 06/12/2016 11:09    Impression: 83 year old female admitted with symptomatic anemia, evidence of significant iron deficiency (ferritin 6, iron 7), heme negative, and without overt GI bleeding. 2 units PRBCs completed yesterday with improvement in Hgb to the 9 range. Symptomatically feels much better. Last colonoscopy at least 20 years ago by Dr. Gala Romney. Only reports vague solid food and liquid dysphagia that has been present for quite some time. Also reports taking Naproxen once daily. Discussed diagnostic evaluation to include colonoscopy/EGD/and dilation as appropriate. She is agreeable to this while inpatient and desires to proceed.   Plan: Colonoscopy/EGD/dilation with Dr. Oneida Alar on 06/14/16 NPO after midnight Hold heparin dosing tomorrow after this evening's dose Miralax prep along with dulcolax ordered Tap water enema X 2 tomorrow Nursing staff aware of colon prep   Annitta Needs, ANP-BC [facility]      LOS: 0 days    06/13/2016, 2:50 PM

## 2016-06-13 NOTE — Evaluation (Signed)
Occupational Therapy Evaluation Patient Details Name: Katelyn Daniel MRN: [id number] DOB: Jul 11, 1923 Today's Date: 06/13/2016    History of Present Illness Katelyn Daniel is a 83 y.o. female who has been having symptoms of progressive weakness for the past several weeks.  She has been followed for this by her PCP and reports that she was told that she had anemia. She denies black stools.  She was Hemoccult negative. She says that she also has been having sore throat and reports that sensation that she feels like she is smothering when lying recumbent. She reports shortness of breath with ambulation and weakness. She reports that her symptoms have progressed over the past week. She had been scheduled to see her PCP in the office today however the office is closed.  She denies having chest pain. She was evaluated in the emergency department and found to have a hemoglobin of 7.9. She also was noted to have an elevated BUN and creatinine. Blood transfusion 06/13/15.    Clinical Impression   Pt awake, alert, oriented x4 this am, husband and son present for evaluation. Pt reports she is feeling much improved after blood transfusion, noting she feels well enough to go home if MD will allow it. Pt demonstrates mod independence with ADL completion this am, supervision/min guard during functional mobility for managing IV pole and occasional unsteadiness. At this time pt is functioning at baseline with ADL completion, no further OT services required on discharge.     Follow Up Recommendations  No OT follow up    Equipment Recommendations  None recommended by OT       Precautions / Restrictions Precautions Precautions: None Restrictions Weight Bearing Restrictions: No      Mobility Bed Mobility Overal bed mobility: Modified Independent                Transfers Overall transfer level: Modified independent                         ADL Overall ADL's : Modified  independent Eating/Feeding: Modified independent   Grooming: Wash/dry hands;Modified independent;Standing                   Toilet Transfer: Modified Building services engineer and Hygiene: Modified independent;Sit to/from stand       Functional mobility during ADLs: Min guard       Vision Vision Assessment?: No apparent visual deficits          Pertinent Vitals/Pain Pain Assessment: No/denies pain     Hand Dominance Right   Extremity/Trunk Assessment Upper Extremity Assessment Upper Extremity Assessment: Overall WFL for tasks assessed   Lower Extremity Assessment Lower Extremity Assessment: Defer to PT evaluation   Cervical / Trunk Assessment Cervical / Trunk Assessment: Normal   Communication Communication Communication: No difficulties   Cognition Arousal/Alertness: Awake/alert Behavior During Therapy: WFL for tasks assessed/performed Overall Cognitive Status: Within Functional Limits for tasks assessed                                Home Living Family/patient expects to be discharged to:: Private residence Living Arrangements: Spouse/significant other Available Help at Discharge: Family;Available 24 hours/day Type of Home: House Home Access: Stairs to enter [employer] of Steps: 1 Entrance Stairs-Rails: None Home Layout: Two level;Able to live on main level with bedroom/bathroom (pt prefers to use shower in basement)  Bathroom Shower/Tub: Tub/shower unit;Walk-in shower   Bathroom Toilet: Standard     Home Equipment: Cane - single point;Shower seat (rollator)   Additional Comments: Pt reports they have all necessities on main level, however she does prefer to use the shower located in the basement      Prior Functioning/Environment Level of Independence: Independent                 OT Problem List: Decreased activity tolerance    End of Session Equipment Utilized  During Treatment: Gait belt  Activity Tolerance: Patient tolerated treatment well Patient left: in bed;with call bell/phone within reach;with nursing/sitter in room;with family/visitor present   Time: 4712-5271 OT Time Calculation (min): 16 min Charges:  OT General Charges $OT Visit: 1 Procedure OT Evaluation $OT Eval Low Complexity: 1 Procedure G-Codes: OT G-codes **NOT FOR INPATIENT CLASS** Functional Assessment Tool Used: clinical judgement Functional Limitation: Self care Self Care Current Status (S9290): At least 1 percent but less than 20 percent impaired, limited or restricted Self Care Goal Status (R0301): At least 1 percent but less than 20 percent impaired, limited or restricted Self Care Discharge Status [phone]): At least 1 percent but less than 20 percent impaired, limited or restricted   Guadelupe Sabin, OTR/L  [phone] 06/13/2016, 9:26 AM

## 2016-06-13 NOTE — Evaluation (Signed)
Physical Therapy Evaluation Patient Details Name: Katelyn Daniel MRN: [id number] DOB: 1924/03/25 Today's Date: 06/13/2016   History of Present Illness  Katelyn Daniel is a 83 y.o. female who has been having symptoms of progressive weakness for the past several weeks.  She has been followed for this by her PCP and reports that she was told that she had anemia. She denies black stools.  She was Hemoccult negative. She says that she also has been having sore throat and reports that sensation that she feels like she is smothering when lying recumbent. She reports shortness of breath with ambulation and weakness. She reports that her symptoms have progressed over the past week. She had been scheduled to see her PCP in the office today however the office is closed.  She denies having chest pain. She was evaluated in the emergency department and found to have a hemoglobin of 7.9. She also was noted to have an elevated BUN and creatinine. Given the progressive nature of her symptoms, she will be admitted for observation and blood transfusion.  Clinical Impression  Pt seen for evaluation she is in no need of skilled therapy at this time. Pt will benefit from walking with nursing staff while she is in the hospital.     Follow Up Recommendations No PT follow up    Equipment Recommendations  None recommended by PT    Recommendations for Other Services       Precautions / Restrictions Precautions Precautions: None Restrictions Weight Bearing Restrictions: No      Mobility  Bed Mobility Overal bed mobility: Modified Independent                Transfers Overall transfer level: Modified independent                  Ambulation/Gait Ambulation/Gait assistance: Modified independent (Device/Increase time) Ambulation Distance (Feet): 180 Feet Assistive device: None Gait Pattern/deviations: WFL(Within Functional Limits)   Gait velocity interpretation: Below normal speed for  age/gender    Stairs            Wheelchair Mobility    Modified Rankin (Stroke Patients Only)       Balance                                             Pertinent Vitals/Pain      Home Living Family/patient expects to be discharged to:: Private residence Living Arrangements: Spouse/significant other Available Help at Discharge: Family;Available 24 hours/day Type of Home: House Home Access: Stairs to enter Entrance Stairs-Rails: None Entrance Stairs-Number of Steps: 1 Home Layout: Two level;Able to live on main level with bedroom/bathroom (pt prefers to use shower in basement) Home Equipment: Cane - single point;Shower seat (rollator) Additional Comments: Pt reports they have all necessities on main level, however she does prefer to use the shower located in the basement    Prior Function Level of Independence: Independent               Hand Dominance   Dominant Hand: Right    Extremity/Trunk Assessment   Upper Extremity Assessment Upper Extremity Assessment: Defer to OT evaluation    Lower Extremity Assessment Lower Extremity Assessment: Overall WFL for tasks assessed    Cervical / Trunk Assessment Cervical / Trunk Assessment: Normal  Communication   Communication: No difficulties  Cognition  General Comments      Exercises     Assessment/Plan    PT Assessment Patent does not need any further PT services  PT Problem List            PT Treatment Interventions      PT Goals (Current goals can be found in the Care Plan section)  Acute Rehab PT Goals Patient Stated Goal: to go home PT Goal Formulation: With patient Time For Goal Achievement: 06/15/16 Potential to Achieve Goals: Good    Frequency     Barriers to discharge        Co-evaluation               End of Session Equipment Utilized During Treatment: Gait belt Activity Tolerance: Patient tolerated treatment  well Patient left: in bed Nurse Communication: Mobility status    Functional Limitation: Mobility: Walking and moving around Mobility: Walking and Moving Around Current Status (I0165): At least 1 percent but less than 20 percent impaired, limited or restricted Mobility: Walking and Moving Around Goal Status [phone]): At least 1 percent but less than 20 percent impaired, limited or restricted Mobility: Walking and Moving Around Discharge Status [phone]): At least 1 percent but less than 20 percent impaired, limited or restricted    Time: 6754-4920 PT Time Calculation (min) (ACUTE ONLY): 14 min   Charges:         PT G Codes:   PT G-Codes **NOT FOR INPATIENT CLASS** Functional Limitation: Mobility: Walking and moving around Mobility: Walking and Moving Around Current Status (F0071): At least 1 percent but less than 20 percent impaired, limited or restricted Mobility: Walking and Moving Around Goal Status [phone]): At least 1 percent but less than 20 percent impaired, limited or restricted Mobility: Walking and Moving Around Discharge Status [phone]): At least 1 percent but less than 20 percent impaired, limited or restricted   Rayetta Humphrey, PT CLT [phone] 06/13/2016, 1:05 PM

## 2016-06-13 NOTE — Progress Notes (Signed)
*  PRELIMINARY RESULTS* Echocardiogram 2D Echocardiogram has been performed.  Leavy Cella 06/13/2016, 2:06 PM

## 2016-06-13 NOTE — Progress Notes (Signed)
Patient has new anemia which was being worked up as an outpatient with stools and anemia panel she became symptomatic was admitted to receive 2 units of PRBCs anemia panel shows very low iron and ferritin and mildly diminished B-12 levels patient has not had a hysterectomy but denies vaginal bleeding she had a colonoscopy 20 years ago said it was okay I'm calling a gastroenterology consult stool samples are pending at present Katelyn Daniel DJT:[id number] DOB: Feb 12, 1924 DOA: 06/12/2016 PCP: [name], MD   Physical Exam: Blood pressure 134/85, pulse 87, temperature 98 F (36.7 C), temperature source Oral, resp. rate 18, height 5' 7.5" (1.715 m), weight 65.3 kg (144 lb), SpO2 98 %. Lungs clear to A&P no rales wheeze rhonchi heart regular rhythm no S3-S4 no heaves thrills rubs abdomen soft nontender bowel sounds normoactive   Investigations:  No results found for this or any previous visit (from the past 240 hour(s)).   Basic Metabolic Panel:  Recent Labs  06/12/16 1111 06/13/16 0606  NA 136 136  K 4.1 3.7  CL 103 106  CO2 25 24  GLUCOSE 117* 91  BUN 26* 21*  CREATININE 1.30* 1.10*  CALCIUM 9.1 8.5*   Liver Function Tests:  Recent Labs  06/12/16 1111  AST 18  ALT 13*  ALKPHOS 56  BILITOT 0.4  PROT 6.2*  ALBUMIN 3.4*     CBC:  Recent Labs  06/12/16 1111 06/13/16 0606  WBC 7.2 6.4  HGB 7.9* 9.3*  HCT 25.5* 28.6*  MCV 78.0 78.4  PLT 519* 428*    Dg Chest 2 View  Result Date: 06/12/2016 CLINICAL DATA:  Cough, weakness EXAM: CHEST  2 VIEW COMPARISON:  CT chest 05/25/2016 FINDINGS: There is no focal parenchymal opacity. There is no pleural effusion or pneumothorax. The heart and mediastinal contours are unremarkable. There is thoracic aortic atherosclerosis.The osseous structures are unremarkable. IMPRESSION: No active cardiopulmonary disease. Electronically Signed   By: Kathreen Devoid   On: 06/12/2016 11:09      Medications:   Impression:  Active  Problems:   Generalized weakness   Symptomatic anemia   AKI (acute kidney injury) ([place])   Mild dehydration   Thrombocytosis ([facility])   Hypothyroidism   Essential hypertension   Hyperlipidemia     Plan: B-12 injection 1000 g daily 3. Gastroenterology consult regarding presumed GI blood loss? Stool samples pending.   Consultants: Gastroenterology requested   Procedures   Antibiotics:           Time spent: 30 minutes   LOS: 0 days   Zaki Gertsch M   06/13/2016, 12:16 PM

## 2016-06-13 NOTE — Care Management Obs Status (Signed)
[place] NOTIFICATION   Patient Details  Name: Katelyn Daniel MRN: [id number] Date of Birth: 23-Jun-1923   Medicare Observation Status Notification Given:  Yes    Sherald Barge, RN 06/13/2016, 8:48 AM

## 2016-06-14 ENCOUNTER — Encounter (HOSPITAL_COMMUNITY)
Admission: EM | Disposition: A | Discharge status: Home / Self Care | Payer: Self-pay | Source: Home / Self Care | Attending: Family Medicine

## 2016-06-14 ENCOUNTER — Encounter (HOSPITAL_COMMUNITY): Payer: Self-pay | Admitting: *Deleted

## 2016-06-14 DIAGNOSIS — D649 Anemia, unspecified: Secondary | ICD-10-CM | POA: Diagnosis present

## 2016-06-14 DIAGNOSIS — J069 Acute upper respiratory infection, unspecified: Secondary | ICD-10-CM | POA: Diagnosis present

## 2016-06-14 DIAGNOSIS — I10 Essential (primary) hypertension: Secondary | ICD-10-CM | POA: Diagnosis present

## 2016-06-14 DIAGNOSIS — K2940 Chronic atrophic gastritis without bleeding: Secondary | ICD-10-CM | POA: Diagnosis present

## 2016-06-14 DIAGNOSIS — E860 Dehydration: Secondary | ICD-10-CM | POA: Diagnosis present

## 2016-06-14 DIAGNOSIS — K648 Other hemorrhoids: Secondary | ICD-10-CM | POA: Diagnosis present

## 2016-06-14 DIAGNOSIS — D509 Iron deficiency anemia, unspecified: Principal | ICD-10-CM

## 2016-06-14 DIAGNOSIS — K208 Other esophagitis: Secondary | ICD-10-CM | POA: Diagnosis present

## 2016-06-14 DIAGNOSIS — Q438 Other specified congenital malformations of intestine: Secondary | ICD-10-CM | POA: Diagnosis not present

## 2016-06-14 DIAGNOSIS — N179 Acute kidney failure, unspecified: Secondary | ICD-10-CM | POA: Diagnosis present

## 2016-06-14 DIAGNOSIS — R1310 Dysphagia, unspecified: Secondary | ICD-10-CM

## 2016-06-14 DIAGNOSIS — K222 Esophageal obstruction: Secondary | ICD-10-CM

## 2016-06-14 DIAGNOSIS — K209 Esophagitis, unspecified: Secondary | ICD-10-CM | POA: Diagnosis not present

## 2016-06-14 DIAGNOSIS — Z79899 Other long term (current) drug therapy: Secondary | ICD-10-CM | POA: Diagnosis not present

## 2016-06-14 DIAGNOSIS — B3781 Candidal esophagitis: Secondary | ICD-10-CM | POA: Diagnosis present

## 2016-06-14 DIAGNOSIS — E039 Hypothyroidism, unspecified: Secondary | ICD-10-CM | POA: Diagnosis present

## 2016-06-14 DIAGNOSIS — E538 Deficiency of other specified B group vitamins: Secondary | ICD-10-CM | POA: Diagnosis present

## 2016-06-14 DIAGNOSIS — E785 Hyperlipidemia, unspecified: Secondary | ICD-10-CM | POA: Diagnosis present

## 2016-06-14 DIAGNOSIS — D473 Essential (hemorrhagic) thrombocythemia: Secondary | ICD-10-CM | POA: Diagnosis present

## 2016-06-14 HISTORY — PX: COLONOSCOPY: SHX5424

## 2016-06-14 HISTORY — PX: ESOPHAGOGASTRODUODENOSCOPY: SHX5428

## 2016-06-14 LAB — MAGNESIUM: MAGNESIUM: 2.1 mg/dL (ref 1.7–2.4)

## 2016-06-14 LAB — BASIC METABOLIC PANEL
ANION GAP: 6 (ref 5–15)
BUN: 19 mg/dL (ref 6–20)
CALCIUM: 8.5 mg/dL — AB (ref 8.9–10.3)
CO2: 25 mmol/L (ref 22–32)
Chloride: 105 mmol/L (ref 101–111)
Creatinine, Ser: 1.08 mg/dL — ABNORMAL HIGH (ref 0.44–1.00)
GFR calc Af Amer: 51 mL/min — ABNORMAL LOW (ref >60)
GFR, EST NON AFRICAN AMERICAN: 44 mL/min — AB (ref >60)
GLUCOSE: 95 mg/dL (ref 65–99)
POTASSIUM: 3.2 mmol/L — AB (ref 3.5–5.1)
SODIUM: 136 mmol/L (ref 135–145)

## 2016-06-14 LAB — KOH PREP

## 2016-06-14 SURGERY — COLONOSCOPY
Anesthesia: Moderate Sedation

## 2016-06-14 MED ORDER — MIDAZOLAM HCL 5 MG/5ML IJ SOLN
INTRAMUSCULAR | Status: DC | PRN
  Administered 2016-06-14 (×3): 1 mg via INTRAVENOUS
  Administered 2016-06-14: 2 mg via INTRAVENOUS
  Administered 2016-06-14: 1 mg via INTRAVENOUS

## 2016-06-14 MED ORDER — LIDOCAINE VISCOUS 2 % MT SOLN
OROMUCOSAL | Status: DC | PRN
  Administered 2016-06-14: 1 via OROMUCOSAL

## 2016-06-14 MED ORDER — LIDOCAINE VISCOUS 2 % MT SOLN
OROMUCOSAL | Status: AC
  Filled 2016-06-14: qty 15

## 2016-06-14 MED ORDER — MEPERIDINE HCL 100 MG/ML IJ SOLN
INTRAMUSCULAR | Status: AC
  Filled 2016-06-14: qty 2

## 2016-06-14 MED ORDER — FENTANYL CITRATE (PF) 100 MCG/2ML IJ SOLN
INTRAMUSCULAR | Status: AC
  Filled 2016-06-14: qty 2

## 2016-06-14 MED ORDER — PANTOPRAZOLE SODIUM 40 MG PO TBEC
40.0000 mg | DELAYED_RELEASE_TABLET | Freq: Two times a day (BID) | ORAL | Status: DC
  Administered 2016-06-14 – 2016-06-15 (×2): 40 mg via ORAL
  Filled 2016-06-14 (×2): qty 1

## 2016-06-14 MED ORDER — POTASSIUM CHLORIDE 20 MEQ PO PACK
40.0000 meq | PACK | Freq: Two times a day (BID) | ORAL | Status: AC
  Administered 2016-06-14 (×2): 40 meq via ORAL
  Filled 2016-06-14 (×2): qty 2

## 2016-06-14 MED ORDER — STERILE WATER FOR IRRIGATION IR SOLN
Status: DC | PRN
  Administered 2016-06-14: 200 mL

## 2016-06-14 MED ORDER — FENTANYL CITRATE (PF) 100 MCG/2ML IJ SOLN
INTRAMUSCULAR | Status: DC | PRN
  Administered 2016-06-14 (×3): 25 ug via INTRAVENOUS

## 2016-06-14 MED ORDER — MINERAL OIL PO OIL
TOPICAL_OIL | ORAL | Status: AC
Start: 2016-06-14 — End: 2016-06-14
  Filled 2016-06-14: qty 30

## 2016-06-14 MED ORDER — MIDAZOLAM HCL 5 MG/5ML IJ SOLN
INTRAMUSCULAR | Status: AC
  Filled 2016-06-14: qty 10

## 2016-06-14 NOTE — Progress Notes (Signed)
Appreciate GI expertise for endoscopy today. NESTA SCATURRO BVQ:[id number] DOB: 1923-10-28 DOA: 06/12/2016 PCP: [name], MD   Physical Exam: Blood pressure 138/64, pulse 77, temperature 98.4 F (36.9 C), temperature source Oral, resp. rate 18, height 5' 7.5" (1.715 m), weight 65.3 kg (144 lb), SpO2 98 %. Lungs clear to A&P no rales wheeze rhonchi heart regular rhythm no S3 or S4 no heaves feels rubs abdomen soft nontender bowel sounds normoactive   Investigations:  No results found for this or any previous visit (from the past 240 hour(s)).   Basic Metabolic Panel:  Recent Labs  06/12/16 1111 06/13/16 0606  NA 136 136  K 4.1 3.7  CL 103 106  CO2 25 24  GLUCOSE 117* 91  BUN 26* 21*  CREATININE 1.30* 1.10*  CALCIUM 9.1 8.5*   Liver Function Tests:  Recent Labs  06/12/16 1111  AST 18  ALT 13*  ALKPHOS 56  BILITOT 0.4  PROT 6.2*  ALBUMIN 3.4*     CBC:  Recent Labs  06/12/16 1111 06/13/16 0606  WBC 7.2 6.4  HGB 7.9* 9.3*  HCT 25.5* 28.6*  MCV 78.0 78.4  PLT 519* 428*    Dg Chest 2 View  Result Date: 06/12/2016 CLINICAL DATA:  Cough, weakness EXAM: CHEST  2 VIEW COMPARISON:  CT chest 05/25/2016 FINDINGS: There is no focal parenchymal opacity. There is no pleural effusion or pneumothorax. The heart and mediastinal contours are unremarkable. There is thoracic aortic atherosclerosis.The osseous structures are unremarkable. IMPRESSION: No active cardiopulmonary disease. Electronically Signed   By: Kathreen Devoid   On: 06/12/2016 11:09      Medications:  Impression:  Active Problems:   Generalized weakness   Symptomatic anemia   AKI (acute kidney injury) ([place])   Mild dehydration   Thrombocytosis ([place])   Hypothyroidism   Essential hypertension   Hyperlipidemia     Plan: Endoscopy today  Consultants: Gastroenterology   Procedures EGD and colonoscopy today   Antibiotics:          Time spent: 30 minutes   LOS: 0 days    Chonte Ricke M   06/14/2016, 6:36 AM

## 2016-06-14 NOTE — H&P (Signed)
  Primary Care Physician:  [name], MD Primary Gastroenterologist:  Dr. Oneida Alar  Pre-Procedure History & Physical: HPI:  Katelyn Daniel is a 83 y.o. female here for Anemia/heme pos stools.  Past Medical History:  Diagnosis Date  . Hyperlipemia   . Hypertension   . Thyroid disease     Past Surgical History:  Procedure Laterality Date  . TONSILLECTOMY    . [facility] SINUS      Prior to Admission medications   Medication Sig Start Date End Date Taking? Authorizing Provider  gabapentin (NEURONTIN) 300 MG capsule Take 300 mg by mouth at bedtime. 06/06/16  Yes Historical Provider, MD  levothyroxine (SYNTHROID, LEVOTHROID) 50 MCG tablet Take 50 mcg by mouth daily. 06/02/16  Yes Historical Provider, MD  lisinopril-hydrochlorothiazide (PRINZIDE,ZESTORETIC) 20-12.5 MG tablet Take 1 tablet by mouth daily. 04/02/16  Yes Historical Provider, MD  Multiple Vitamins-Minerals (PRESERVISION AREDS PO) Take 1 capsule by mouth daily.   Yes Historical Provider, MD  pravastatin (PRAVACHOL) 40 MG tablet Take 40 mg by mouth daily. 03/16/16  Yes Historical Provider, MD    Allergies as of 06/12/2016  . (No Known Allergies)    Family History  Problem Relation Age of Onset  . Colon cancer Neg Hx     Social History   Social History  . Marital status: Married    Spouse name: N/A  . Number of children: N/A  . Years of education: N/A   Occupational History  . Not on file.   Social History Main Topics  . Smoking status: Never Smoker  . Smokeless tobacco: Never Used  . Alcohol use No  . Drug use: No  . Sexual activity: Not on file   Other Topics Concern  . Not on file   Social History Narrative  . No narrative on file    Review of Systems: See HPI, otherwise negative ROS   Physical Exam: BP (!) 147/96   Pulse 74   Temp 97.5 F (36.4 C) (Oral)   Resp 20   Ht 5' 7.5" (1.715 m)   Wt 144 lb (65.3 kg)   SpO2 100%   BMI 22.22 kg/m  General:   Alert,  pleasant and cooperative  in NAD Head:  Normocephalic and atraumatic. Neck:  Supple; Lungs:  Clear throughout to auscultation.    Heart:  Regular rate and rhythm. Abdomen:  Soft, nontender and nondistended. Normal bowel sounds, without guarding, and without rebound.   Neurologic:  Alert and  oriented x4;  grossly normal neurologically.  Impression/Plan:     Anemia/heme pos stools  PLAN:  1. TCS/EGD TODAY. DISCUSSED PROCEDURE, BENEFITS, & RISKS: < 1% chance of medication reaction, bleeding, perforation, or rupture of spleen/liver.

## 2016-06-14 NOTE — Op Note (Signed)
[facility] Patient Name: Katelyn Daniel Procedure Date: 06/14/2016 9:55 AM MRN: [id number] Date of Birth: 1924-01-23 Attending MD: Barney Drain , MD CSN: [id number] Age: 83 Admit Type: Inpatient Procedure:                Colonoscopy, DIAGNOSTIC Indications:              Iron deficiency anemia-Hb 7.9, FERRITIN 7 Providers:                Barney Drain, MD, Lurline Del, RN, Aram Candela,                            Randa Spike, Technician Referring MD:             Ralene Bathe. Dondiego, MD Medicines:                Fentanyl 50 micrograms IV, Midazolam 3 mg IV Complications:            No immediate complications. Estimated Blood Loss:     Estimated blood loss: none. Procedure:                Pre-Anesthesia Assessment:                           - Prior to the procedure, a History and Physical                            was performed, and patient medications and                            allergies were reviewed. The patient's tolerance of                            previous anesthesia was also reviewed. The risks                            and benefits of the procedure and the sedation                            options and risks were discussed with the patient.                            All questions were answered, and informed consent                            was obtained. Prior Anticoagulants: The patient has                            taken no previous anticoagulant or antiplatelet                            agents. ASA Grade Assessment: II - A patient with                            mild systemic disease. After reviewing the risks  and benefits, the patient was deemed in                            satisfactory condition to undergo the procedure.                            After obtaining informed consent, the colonoscope                            was passed under direct vision. Throughout the                            procedure, the patient's blood  pressure, pulse, and                            oxygen saturations were monitored continuously. The                            EC-[id number] ([id number]) scope was introduced through                            the anus and advanced to the 5 cm into the ileum.                            The terminal ileum, ileocecal valve, appendiceal                            orifice, and rectum were photographed. The                            colonoscopy was somewhat difficult due to a                            tortuous colon. Successful completion of the                            procedure was aided by increasing the dose of                            sedation medication and COLOWRAP. The patient                            tolerated the procedure well. The quality of the                            bowel preparation was good. Scope In: 10:25:17 AM Scope Out: 10:51:17 AM Scope Withdrawal Time: 0 hours 18 minutes 49 seconds  Total Procedure Duration: 0 hours 26 minutes 0 seconds  Findings:      The recto-sigmoid colon, sigmoid colon and descending colon were       moderately redundant.      The terminal ileum appeared normal.      Internal hemorrhoids were found. The hemorrhoids were small. Impression:               -  Redundant LEFT colon.                           - The examined portion of the ileum was normal.                           - Internal hemorrhoids.                           - NO SOURCE FOR IRON DEFICIENCY ANEMIA IDENTIFIED Moderate Sedation:      Moderate (conscious) sedation was administered by the endoscopy nurse       and supervised by the endoscopist. The following parameters were       monitored: oxygen saturation, heart rate, blood pressure, and response       to care. Total physician intraservice time was 62 minutes. Recommendation:           - Cardiac diet.                           - Continue present medications.                           - Await pathology results.                            - Return patient to hospital ward for ongoing care.                           - No repeat colonoscopy due to age. Procedure Code(s):        --- Professional ---                           [phone], Colonoscopy, flexible; diagnostic, including                            collection of specimen(s) by brushing or washing,                            when performed (separate procedure)                           99152, Moderate sedation services provided by the                            same physician or other qualified health care                            professional performing the diagnostic or                            therapeutic service that the sedation supports,                            requiring the presence of an independent trained  observer to assist in the monitoring of the                            patient's level of consciousness and physiological                            status; initial 15 minutes of intraservice time,                            patient age 50 years or older                           [phone], Moderate sedation services; each additional                            15 minutes intraservice time                           [phone], Moderate sedation services; each additional                            15 minutes intraservice time                           99153, Moderate sedation services; each additional                            15 minutes intraservice time Diagnosis Code(s):        --- Professional ---                           K64.8, Other hemorrhoids                           D50.9, Iron deficiency anemia, unspecified                           Q43.8, Other specified congenital malformations of                            intestine CPT copyright 2016 [employer]. All rights reserved. The codes documented in this report are preliminary and upon coder review may  be revised to meet current compliance  requirements. Barney Drain, MD Barney Drain, MD 06/14/2016 11:31:28 AM This report has been signed electronically. Number of Addenda: 0

## 2016-06-14 NOTE — Op Note (Signed)
[facility] Patient Name: Katelyn Daniel Procedure Date: 06/14/2016 10:53 AM MRN: [id number] Date of Birth: Aug 03, 1923 Attending MD: Barney Drain , MD CSN: [id number] Age: 83 Admit Type: Inpatient Procedure:                Upper GI endoscopy WITH COLD FORCEPS                            BIOPSY/ESOPHAGEAL DILATION Indications:              Iron deficiency anemia, Dysphagia Providers:                Barney Drain, MD, Lurline Del, RN, Aram Candela,                            Randa Spike, Technician Referring MD:             Ralene Bathe. Dondiego, MD Medicines:                TCS + Fentanyl 25 micrograms IV, Midazolam 3 mg IV Complications:            No immediate complications. Estimated Blood Loss:     Estimated blood loss was minimal. Procedure:                Pre-Anesthesia Assessment:                           - Prior to the procedure, a History and Physical                            was performed, and patient medications and                            allergies were reviewed. The patient's tolerance of                            previous anesthesia was also reviewed. The risks                            and benefits of the procedure and the sedation                            options and risks were discussed with the patient.                            All questions were answered, and informed consent                            was obtained. Prior Anticoagulants: The patient has                            taken no previous anticoagulant or antiplatelet                            agents. ASA Grade Assessment: II - A patient with  mild systemic disease. After reviewing the risks                            and benefits, the patient was deemed in                            satisfactory condition to undergo the procedure.                            After obtaining informed consent, the endoscope was                            passed under direct vision.  Throughout the                            procedure, the patient's blood pressure, pulse, and                            oxygen saturations were monitored continuously. The                            [id number] ([id number]) scope was introduced through the                            mouth, and advanced to the third part of duodenum.                            The upper GI endoscopy was accomplished without                            difficulty. The patient tolerated the procedure                            well. Scope In: 10:58:21 AM Scope Out: 11:14:24 AM Total Procedure Duration: 0 hours 16 minutes 3 seconds  Findings:      Esophagitis with no bleeding was found. This was biopsied with a BRUSH       for histology.      One moderate (circumferential scarring or stenosis; an endoscope may       pass) benign-appearing, intrinsic stenosis was found. And was traversed.       A guidewire was placed and the scope was withdrawn. Dilation was       performed with a Savary dilator with no resistance at 14 mm, mild       resistance at 12.8 mm and moderate resistance at 15 mm and 16 mm.      Diffuse moderate inflammation characterized by congestion (edema),       erosions, erythema and shallow ulcerations was found in the entire       examined stomach. Biopsies were taken with a cold forceps for       Helicobacter pylori testing.      The examined duodenum was normal. Biopsies for histology were taken with       a cold forceps for evaluation of celiac disease. Impression:               -  PROBABLE Candidiasis esophagitis.                           - Benign-appearing esophageal stenosis.                           - Atrophic gastritis. Moderate Sedation:      Moderate (conscious) sedation was administered by the endoscopy nurse       and supervised by the endoscopist. The following parameters were       monitored: oxygen saturation, heart rate, blood pressure, and response       to care. Total  physician intraservice time was 62 minutes. Recommendation:           - Await pathology results.                           - Resume previous diet.                           - Continue present medications.                           - Return to my office in 4 months.                           - Return patient to hospital ward. Procedure Code(s):        --- Professional ---                           [phone], Esophagogastroduodenoscopy, flexible,                            transoral; with insertion of guide wire followed by                            passage of dilator(s) through esophagus over guide                            wire                           43239, Esophagogastroduodenoscopy, flexible,                            transoral; with biopsy, single or multiple                           99152, Moderate sedation services provided by the                            same physician or other qualified health care                            professional performing the diagnostic or                            therapeutic service that the sedation supports,  requiring the presence of an independent trained                            observer to assist in the monitoring of the                            patient's level of consciousness and physiological                            status; initial 15 minutes of intraservice time,                            patient age 26 years or older                           [phone], Moderate sedation services; each additional                            15 minutes intraservice time                           [phone], Moderate sedation services; each additional                            15 minutes intraservice time                           99153, Moderate sedation services; each additional                            15 minutes intraservice time Diagnosis Code(s):        --- Professional ---                           B37.81, Candidal  esophagitis                           K22.2, Esophageal obstruction                           K29.40, Chronic atrophic gastritis without bleeding                           D50.9, Iron deficiency anemia, unspecified                           R13.10, Dysphagia, unspecified CPT copyright 2016 American Medical Association. All rights reserved. The codes documented in this report are preliminary and upon coder review may  be revised to meet current compliance requirements. Barney Drain, MD Barney Drain, MD 06/14/2016 11:37:10 AM This report has been signed electronically. Number of Addenda: 0

## 2016-06-15 DIAGNOSIS — B3781 Candidal esophagitis: Secondary | ICD-10-CM

## 2016-06-15 DIAGNOSIS — D509 Iron deficiency anemia, unspecified: Secondary | ICD-10-CM

## 2016-06-15 MED ORDER — FLUCONAZOLE 100 MG PO TABS: 100.0000 mg | ORAL_TABLET | Freq: Every day | ORAL | Status: DC

## 2016-06-15 MED ORDER — TRAZODONE HCL 50 MG PO TABS: 50.0000 mg | ORAL_TABLET | Freq: Every evening | ORAL | 3 refills | Status: AC | PRN

## 2016-06-15 MED ORDER — PANTOPRAZOLE SODIUM 40 MG PO TBEC: 40.0000 mg | DELAYED_RELEASE_TABLET | Freq: Two times a day (BID) | ORAL | 3 refills | Status: DC

## 2016-06-15 MED ORDER — FLUCONAZOLE 100 MG PO TABS
200.0000 mg | ORAL_TABLET | Freq: Once | ORAL | Status: AC
  Administered 2016-06-15: 200 mg via ORAL
  Filled 2016-06-15: qty 2

## 2016-06-15 MED ORDER — FLUCONAZOLE 100 MG PO TABS: 100.0000 mg | ORAL_TABLET | Freq: Every day | ORAL | 0 refills | Status: DC

## 2016-06-15 MED ORDER — FERROUS SULFATE 325 (65 FE) MG PO TABS: 325.0000 mg | ORAL_TABLET | Freq: Two times a day (BID) | ORAL | 3 refills | Status: DC

## 2016-06-15 NOTE — Discharge Summary (Signed)
Physician Discharge Summary  Katelyn Daniel JYN:[id number] DOB: 1924/06/01 DOA: 06/12/2016  PCP: [name], MD  Admit date: 06/12/2016 Discharge date: 06/15/2016   Recommendations for Outpatient Follow-up:  Patient is advised to take Protonix 40 mg by mouth twice a day indefinitely as well as ferrous sulfate 325 mg by mouth twice a day indefinitely until terminated by her physician she likewise is asked to take all preadmission hospital medications that she was on prior to the hospital with the exception of lisinopril which she is to stop for a two-week period while taking Diflucan 100 mg by mouth daily for 13 additional days Discharge Diagnoses:  Active Problems:   Generalized weakness   Symptomatic anemia   AKI (acute kidney injury) ([place])   Mild dehydration   Thrombocytosis (HCC)   Hypothyroidism   Essential hypertension   Hyperlipidemia   Iron deficiency anemia   Candida esophagitis (HCC)   Discharge Condition: Good  Filed Weights   06/12/16 1037  Weight: 65.3 kg (144 lb)    History of present illness:  Patient is a 83-year-old white female who presents with new-onset anemia she required 2 units transfusion as she was symptomatic analysis of anemia workup revealed mild B-12 deficiency as well as severe iron deficiency with a ferritin of 6 and an iron of 7 she had endoscopy of the EGD as well as colonoscopy colonoscopy was essentially unrevealing and EGD revealed some esophageal strictures some candidal candidiasis erosive esophagitis with no other significant findings she was placed additionally on Protonix 40 by mouth twice a day given Feosol 325 by mouth twice a day as well as Diflucan 100 mg per day for an additional 13 days post discharge started to follow-up with her primary care physician within one to 2 weeks to assess CBC and general clinical status  Hospital Course:  See history of present illness above  Procedures:  EGD and  colonoscopy  Consultations:  Gastroenterology  Discharge Instructions   Allergies as of 06/15/2016   No Known Allergies     Medication List    STOP taking these medications   lisinopril-hydrochlorothiazide 20-12.5 MG tablet Commonly known as:  PRINZIDE,ZESTORETIC     TAKE these medications   ferrous sulfate 325 (65 FE) MG tablet Commonly known as:  FERROUSUL Take 1 tablet (325 mg total) by mouth 2 (two) times daily with a meal.   fluconazole 100 MG tablet Commonly known as:  DIFLUCAN Take 1 tablet (100 mg total) by mouth daily. Start taking on:  06/16/2016   gabapentin 300 MG capsule Commonly known as:  NEURONTIN Take 300 mg by mouth at bedtime.   levothyroxine 50 MCG tablet Commonly known as:  SYNTHROID, LEVOTHROID Take 50 mcg by mouth daily.   pantoprazole 40 MG tablet Commonly known as:  PROTONIX Take 1 tablet (40 mg total) by mouth 2 (two) times daily before a meal.   pravastatin 40 MG tablet Commonly known as:  PRAVACHOL Take 40 mg by mouth daily.   PRESERVISION AREDS PO Take 1 capsule by mouth daily.   traZODone 50 MG tablet Commonly known as:  DESYREL Take 1 tablet (50 mg total) by mouth at bedtime as needed for sleep.      No Known Allergies    The results of significant diagnostics from this hospitalization (including imaging, microbiology, ancillary and laboratory) are listed below for reference.    Significant Diagnostic Studies: Dg Chest 2 View  Result Date: 06/12/2016 CLINICAL DATA:  Cough, weakness EXAM: CHEST  2  VIEW COMPARISON:  CT chest 05/25/2016 FINDINGS: There is no focal parenchymal opacity. There is no pleural effusion or pneumothorax. The heart and mediastinal contours are unremarkable. There is thoracic aortic atherosclerosis.The osseous structures are unremarkable. IMPRESSION: No active cardiopulmonary disease. Electronically Signed   By: Kathreen Devoid   On: 06/12/2016 11:09   Ct Chest Wo Contrast  Result Date:  05/25/2016 CLINICAL DATA:  Weakness, shortness of breath for 1 week EXAM: CT CHEST WITHOUT CONTRAST TECHNIQUE: Multidetector CT imaging of the chest was performed following the standard protocol without IV contrast. COMPARISON:  None. FINDINGS: Cardiovascular: Atherosclerotic calcifications of thoracic aorta. Mild atherosclerotic calcifications of coronary arteries. Heart size within normal limits. No pericardial effusion. Mediastinum/Nodes: No mediastinal hematoma or adenopathy. No hilar adenopathy. Central airways are patent. Lungs/Pleura: Images of the lung parenchyma shows no segmental infiltrate or pulmonary edema. Bilateral apical pleuroparenchymal scarring is noted. Mild bronchiectasis is noted bilaterally. Axial image 83 there is 6 mm nodule in right lower lobe laterally. Second nodule in right lower lobe medially measures 5 mm. Some linear scarring or atelectasis noted right base posteriorly and left base anteromedially. No pleural plaques or pleural thickening. Upper Abdomen: The visualized unenhanced upper abdomen shows no adrenal gland mass. Visualized unenhanced liver and kidneys are unremarkable. Unenhanced spleen is unremarkable. Musculoskeletal: No destructive bony lesions are noted. Sagittal images of the spine shows mild degenerative changes mid thoracic spine. Mild dextroscoliosis mid thoracic spine. IMPRESSION: 1. No segmental infiltrate or pulmonary edema. Bilateral apical pleuroparenchymal scarring. 2. There are 2 nodules are noted in right lower lobe the largest measures 6 mm. Non-contrast chest CT at 3-6 months is recommended. If the nodules are stable at time of repeat CT, then future CT at 18-24 months (from today's scan) is considered optional for low-risk patients, but is recommended for high-risk patients. This recommendation follows the consensus statement: Guidelines for Management of Incidental Pulmonary Nodules Detected on CT Images: From the Fleischner Society 2017; Radiology 2017;  284:228-243. 3. Mild bronchiectasis bilaterally. No pleural thickening or pleural plaques. 4. Mild dextroscoliosis and degenerative changes mid thoracic spine. Electronically Signed   By: Lahoma Crocker M.D.   On: 05/25/2016 15:13    Microbiology: Recent Results (from the past 240 hour(s))  KOH prep     Status: None   Collection Time: 06/14/16 11:06 AM  Result Value Ref Range Status   Specimen Description ESOPHAGUS  Final   Special Requests NONE  Final   KOH Prep MODERATE HYPHAE YEAST  Final   Report Status 06/14/2016 FINAL  Final     Labs: Basic Metabolic Panel:  Recent Labs Lab 06/12/16 1111 06/13/16 0606 06/14/16 0933  NA 136 136 136  K 4.1 3.7 3.2*  CL 103 106 105  CO2 '25 24 25  '$ GLUCOSE 117* 91 95  BUN 26* 21* 19  CREATININE 1.30* 1.10* 1.08*  CALCIUM 9.1 8.5* 8.5*  MG  --   --  2.1   Liver Function Tests:  Recent Labs Lab 06/12/16 1111  AST 18  ALT 13*  ALKPHOS 56  BILITOT 0.4  PROT 6.2*  ALBUMIN 3.4*   No results for input(s): LIPASE, AMYLASE in the last 168 hours. No results for input(s): AMMONIA in the last 168 hours. CBC:  Recent Labs Lab 06/12/16 1111 06/13/16 0606  WBC 7.2 6.4  HGB 7.9* 9.3*  HCT 25.5* 28.6*  MCV 78.0 78.4  PLT 519* 428*   Cardiac Enzymes: No results for input(s): CKTOTAL, CKMB, CKMBINDEX, TROPONINI in the last 168  hours. BNP: BNP (last 3 results) No results for input(s): BNP in the last 8760 hours.  ProBNP (last 3 results) No results for input(s): PROBNP in the last 8760 hours.  CBG: No results for input(s): GLUCAP in the last 168 hours.     Signed:  Danetta Prom Jerilynn Mages  [facility] Pager: [phone] 06/15/2016, 12:07 PM

## 2016-06-15 NOTE — Progress Notes (Signed)
REVIEWED-NO ADDITIONAL RECOMMENDATIONS.-SLF  Subjective:  Wants to go home. No complaints. No overt GI bleeding.   Objective: Vital signs in last 24 hours: Temp:  [97.5 F (36.4 C)-99 F (37.2 C)] 99 F (37.2 C) (01/11 0532) Pulse Rate:  [65-116] 86 (01/11 0532) Resp:  [14-22] 20 (01/11 0532) BP: (124-167)/(71-119) 149/85 (01/11 0532) SpO2:  [86 %-100 %] 98 % (01/11 0532) Last BM Date: 06/14/16 General:   Alert,  Well-developed, well-nourished, pleasant and cooperative in NAD Head:  Normocephalic and atraumatic. Eyes:  Sclera clear, no icterus.  Abdomen:  Soft, nontender and nondistended.  No HSM.  Extremities:  Without clubbing, deformity or edema. Neurologic:  Alert and  oriented x4;  grossly normal neurologically. Skin:  Intact without significant lesions or rashes. Psych:  Alert and cooperative. Normal mood and affect.  Intake/Output from previous day: 01/10 0701 - 01/11 0700 In: 481.3 [P.O.:120; I.V.:361.3] Out: -  Intake/Output this shift: No intake/output data recorded.  Lab Results: CBC  Recent Labs  06/12/16 1111 06/13/16 0606  WBC 7.2 6.4  HGB 7.9* 9.3*  HCT 25.5* 28.6*  MCV 78.0 78.4  PLT 519* 428*   BMET  Recent Labs  06/12/16 1111 06/13/16 0606 06/14/16 0933  NA 136 136 136  K 4.1 3.7 3.2*  CL 103 106 105  CO2 '25 24 25  '$ GLUCOSE 117* 91 95  BUN 26* 21* 19  CREATININE 1.30* 1.10* 1.08*  CALCIUM 9.1 8.5* 8.5*   LFTs  Recent Labs  06/12/16 1111  BILITOT 0.4  ALKPHOS 56  AST 18  ALT 13*  PROT 6.2*  ALBUMIN 3.4*   No results for input(s): LIPASE in the last 72 hours. PT/INR No results for input(s): LABPROT, INR in the last 72 hours.  Lab Results  Component Value Date   VITAMINB12 155 (L) 06/12/2016     Lab Results  Component Value Date   IRON 7 (L) 06/12/2016   TIBC 420 06/12/2016   FERRITIN 6 (L) 06/12/2016     Imaging Studies: Dg Chest 2 View  Result Date: 06/12/2016 CLINICAL DATA:  Cough, weakness EXAM: CHEST  2  VIEW COMPARISON:  CT chest 05/25/2016 FINDINGS: There is no focal parenchymal opacity. There is no pleural effusion or pneumothorax. The heart and mediastinal contours are unremarkable. There is thoracic aortic atherosclerosis.The osseous structures are unremarkable. IMPRESSION: No active cardiopulmonary disease. Electronically Signed   By: Kathreen Devoid   On: 06/12/2016 11:09   Ct Chest Wo Contrast  Result Date: 05/25/2016 CLINICAL DATA:  Weakness, shortness of breath for 1 week EXAM: CT CHEST WITHOUT CONTRAST TECHNIQUE: Multidetector CT imaging of the chest was performed following the standard protocol without IV contrast. COMPARISON:  None. FINDINGS: Cardiovascular: Atherosclerotic calcifications of thoracic aorta. Mild atherosclerotic calcifications of coronary arteries. Heart size within normal limits. No pericardial effusion. Mediastinum/Nodes: No mediastinal hematoma or adenopathy. No hilar adenopathy. Central airways are patent. Lungs/Pleura: Images of the lung parenchyma shows no segmental infiltrate or pulmonary edema. Bilateral apical pleuroparenchymal scarring is noted. Mild bronchiectasis is noted bilaterally. Axial image 83 there is 6 mm nodule in right lower lobe laterally. Second nodule in right lower lobe medially measures 5 mm. Some linear scarring or atelectasis noted right base posteriorly and left base anteromedially. No pleural plaques or pleural thickening. Upper Abdomen: The visualized unenhanced upper abdomen shows no adrenal gland mass. Visualized unenhanced liver and kidneys are unremarkable. Unenhanced spleen is unremarkable. Musculoskeletal: No destructive bony lesions are noted. Sagittal images of the spine shows mild degenerative  changes mid thoracic spine. Mild dextroscoliosis mid thoracic spine. IMPRESSION: 1. No segmental infiltrate or pulmonary edema. Bilateral apical pleuroparenchymal scarring. 2. There are 2 nodules are noted in right lower lobe the largest measures 6 mm.  Non-contrast chest CT at 3-6 months is recommended. If the nodules are stable at time of repeat CT, then future CT at 18-24 months (from today's scan) is considered optional for low-risk patients, but is recommended for high-risk patients. This recommendation follows the consensus statement: Guidelines for Management of Incidental Pulmonary Nodules Detected on CT Images: From the Fleischner Society 2017; Radiology 2017; 284:228-243. 3. Mild bronchiectasis bilaterally. No pleural thickening or pleural plaques. 4. Mild dextroscoliosis and degenerative changes mid thoracic spine. Electronically Signed   By: Lahoma Crocker M.D.   On: 05/25/2016 15:13  [2 weeks]   Assessment: 83 year old female admitted with symptomatic anemia, evidence of significant iron deficiency (ferritin 6, iron 7) and B12 deficiency, heme negative, and without overt GI bleeding. 2 units PRBCs completed yesterday with improvement in Hgb to the 9 range. Symptomatically feels much better. Only reports vague solid food and liquid dysphagia that has been present for quite some time. Also reports taking Naproxen once daily.    EGD/TCS yesterday with no significant findings on colonoscopy. She had candidiasis esophagitis (confirmed with +KOH), benign-appearing esophageal stenosis a/p dilation, atrophic gastritis s/p bx.    Plan: 1. Diflucan '200mg'$  today, then '100mg'$  daily for 13 days (50% usual dose given GFR<50, discussed with pharmacist, Fulton Reek). Would hold hydrochlorothiazide while on treatment due to risk of QT prolongation, electrolyte abnormalities, as outpatient patient was on lisinopril/HCTZ combo medication. Discussed with patient, spouse, son.  2. F/U pending path. 3. Consider oral iron therapy as outpatient. Continue B12 treatments per attending. 4. Would monitor H/H closely as outpatient, if unable to maintain hemoglobin would consider small bowel capsule study.   Laureen Ochs. Bernarda Caffey [facility] [phone] 1/11/20189:18 AM     LOS: 1 day

## 2016-06-15 NOTE — Progress Notes (Signed)
Pt discharging today going home, vitals stable, no c/o pain, nurse went over discharge paperwork, no questions for the nurse.

## 2016-06-16 ENCOUNTER — Encounter (HOSPITAL_COMMUNITY): Payer: Self-pay | Admitting: Gastroenterology

## 2016-06-28 ENCOUNTER — Other Ambulatory Visit: Payer: Self-pay

## 2016-06-28 ENCOUNTER — Telehealth: Payer: Self-pay | Admitting: Gastroenterology

## 2016-06-28 DIAGNOSIS — D509 Iron deficiency anemia, unspecified: Secondary | ICD-10-CM

## 2016-06-28 DIAGNOSIS — K2940 Chronic atrophic gastritis without bleeding: Secondary | ICD-10-CM

## 2016-06-28 NOTE — Telephone Encounter (Signed)
Pt is aware. OK to refer to Hematology.

## 2016-06-28 NOTE — Telephone Encounter (Signed)
PLEASE CALL PT. Please call pt. HER stomach Bx shows ATROPHIC gastritis. ATROPHIC GASTRITIS MEANS THE ACID CELLS IN HER STOMACH ARE NOT PRODUCING ENOUGH ACID. CONTINUE PROTONIX ONCE DAILY IF NEEDED FOR HEARTBURN OR INDIGESTION. SHE SHOULD SEE HEMATOLOGY TO DISCUSS THE NEED FOR PERIODIC IV IRON INFUSIONS, Dx-ATROPHIC GASTRITIS/FEDA. OPV W/ SLF IN MAY 2018 E30 FEDA/VITAMIN B12 DEFICIENCY/ATROPHIC GASTRITIS.

## 2016-06-28 NOTE — Telephone Encounter (Signed)
ON RECALL  °

## 2016-06-28 NOTE — Telephone Encounter (Signed)
Referral sent to Hematology via Epic.

## 2016-07-12 ENCOUNTER — Ambulatory Visit (HOSPITAL_COMMUNITY): Payer: Medicare Other | Admitting: Oncology

## 2016-07-26 ENCOUNTER — Encounter (HOSPITAL_COMMUNITY): Payer: Self-pay | Admitting: Oncology

## 2016-07-26 ENCOUNTER — Encounter (HOSPITAL_COMMUNITY): Payer: Medicare Other | Attending: Oncology | Admitting: Oncology

## 2016-07-26 ENCOUNTER — Encounter (HOSPITAL_COMMUNITY): Payer: Medicare Other

## 2016-07-26 VITALS — BP 155/94 | HR 102 | Temp 97.6°F | Resp 18 | Ht 67.5 in | Wt 142.0 lb

## 2016-07-26 DIAGNOSIS — D649 Anemia, unspecified: Secondary | ICD-10-CM | POA: Diagnosis present

## 2016-07-26 DIAGNOSIS — B3781 Candidal esophagitis: Secondary | ICD-10-CM | POA: Diagnosis not present

## 2016-07-26 DIAGNOSIS — D500 Iron deficiency anemia secondary to blood loss (chronic): Secondary | ICD-10-CM

## 2016-07-26 DIAGNOSIS — K259 Gastric ulcer, unspecified as acute or chronic, without hemorrhage or perforation: Secondary | ICD-10-CM | POA: Insufficient documentation

## 2016-07-26 LAB — CBC WITH DIFFERENTIAL/PLATELET
BASOS ABS: 0.1 10*3/uL (ref 0.0–0.1)
Basophils Relative: 1 %
Eosinophils Absolute: 0.1 10*3/uL (ref 0.0–0.7)
Eosinophils Relative: 2 %
HEMATOCRIT: 36.4 % (ref 36.0–46.0)
HEMOGLOBIN: 12.1 g/dL (ref 12.0–15.0)
LYMPHS ABS: 1.6 10*3/uL (ref 0.7–4.0)
Lymphocytes Relative: 26 %
MCH: 29.4 pg (ref 26.0–34.0)
MCHC: 33.2 g/dL (ref 30.0–36.0)
MCV: 88.3 fL (ref 78.0–100.0)
MONOS PCT: 9 %
Monocytes Absolute: 0.5 10*3/uL (ref 0.1–1.0)
NEUTROS ABS: 3.8 10*3/uL (ref 1.7–7.7)
Neutrophils Relative %: 62 %
Platelets: 328 10*3/uL (ref 150–400)
RBC: 4.12 MIL/uL (ref 3.87–5.11)
RDW: 23.4 % — ABNORMAL HIGH (ref 11.5–15.5)
WBC: 6.1 10*3/uL (ref 4.0–10.5)

## 2016-07-26 LAB — BASIC METABOLIC PANEL
Anion gap: 7 (ref 5–15)
BUN: 26 mg/dL — AB (ref 6–20)
CHLORIDE: 102 mmol/L (ref 101–111)
CO2: 28 mmol/L (ref 22–32)
Calcium: 9.2 mg/dL (ref 8.9–10.3)
Creatinine, Ser: 1.22 mg/dL — ABNORMAL HIGH (ref 0.44–1.00)
GFR calc Af Amer: 44 mL/min — ABNORMAL LOW (ref >60)
GFR calc non Af Amer: 38 mL/min — ABNORMAL LOW (ref >60)
GLUCOSE: 91 mg/dL (ref 65–99)
Potassium: 4.2 mmol/L (ref 3.5–5.1)
SODIUM: 137 mmol/L (ref 135–145)

## 2016-07-26 LAB — LACTATE DEHYDROGENASE: LDH: 170 U/L (ref 98–192)

## 2016-07-26 LAB — IRON AND TIBC
Iron: 73 ug/dL (ref 28–170)
SATURATION RATIOS: 20 % (ref 10.4–31.8)
TIBC: 365 ug/dL (ref 250–450)
UIBC: 292 ug/dL

## 2016-07-26 LAB — RETICULOCYTES
RBC.: 4.12 MIL/uL (ref 3.87–5.11)
Retic Count, Absolute: 45.3 10*3/uL (ref 19.0–186.0)
Retic Ct Pct: 1.1 % (ref 0.4–3.1)

## 2016-07-26 LAB — FERRITIN: FERRITIN: 27 ng/mL (ref 11–307)

## 2016-07-26 LAB — C-REACTIVE PROTEIN: CRP: <0.8 mg/dL (ref <1.0)

## 2016-07-26 LAB — FOLATE: Folate: 22.8 ng/mL (ref >5.9)

## 2016-07-26 LAB — SEDIMENTATION RATE: Sed Rate: 23 mm/hr — ABNORMAL HIGH (ref 0–22)

## 2016-07-26 NOTE — Patient Instructions (Signed)
[place] at [facility] Discharge Instructions  RECOMMENDATIONS MADE BY THE CONSULTANT AND ANY TEST RESULTS WILL BE SENT TO YOUR REFERRING PHYSICIAN.  You were seen today by Kirby Crigler PA-C and Dr. Talbert Cage. Labs today, we will call you with results. Return in 6 weeks for labs and follow up.   Thank you for choosing [place] at [facility] to provide your oncology and hematology care.  To afford each patient quality time with our provider, please arrive at least 15 minutes before your scheduled appointment time.    If you have a lab appointment with the [facility] please come in thru the  Main Entrance and check in at the main information desk  You need to re-schedule your appointment should you arrive 10 or more minutes late.  We strive to give you quality time with our providers, and arriving late affects you and other patients whose appointments are after yours.  Also, if you no show three or more times for appointments you may be dismissed from the clinic at the providers discretion.     Again, thank you for choosing [facility].  Our hope is that these requests will decrease the amount of time that you wait before being seen by our physicians.       _____________________________________________________________  Should you have questions after your visit to [facility] please contact our office at [phone] between the hours of 8:30 a.m. and 4:30 p.m.  Voicemails left after 4:30 p.m. will not be returned until the following business day.  For prescription refill requests, have your pharmacy contact our office.       Resources For Cancer Patients and their Caregivers ? American Cancer Society: Can assist with transportation, wigs, general needs, runs Look Good Feel Better.        509-303-8354 ? Cancer Care: Provides financial assistance, online support groups, medication/co-pay assistance.   1-800-813-HOPE 585-057-7285) ? [facility] Assists [place] cancer patients and their families through emotional , educational and financial support.  470-196-4130 ? [place] DSS Where to apply for food stamps, Medicaid and utility assistance. 782-385-2725 ? [facility]: Transportation to medical appointments. 548 199 1963 ? Social Security Administration: May apply for disability if have a Stage IV cancer. 636-065-6311 714 132 8829 ? [employer], Disability and Transit Services: Assists with nutrition, care and transit needs. [place] Support Programs: '@10RELATIVEDAYS'$ @ > Cancer Support Group  2nd Tuesday of the month 1pm-2pm, Journey Room  > Creative Journey  3rd Tuesday of the month 1130am-1pm, Journey Room  > Look Good Feel Better  1st Wednesday of the month 10am-12 noon, Journey Room (Call [place] to register [phone])

## 2016-07-26 NOTE — Progress Notes (Signed)
[facility] Hematology/Oncology Consultation   Name: Katelyn Daniel      MRN: [id number]    Location: Room/bed info not found  Date: 07/26/2016 Time:5:40 PM   REFERRING PHYSICIAN:  Barney Drain, MD (GI)  REASON FOR CONSULT:  Anemia   DIAGNOSIS:  Normocytic (low-normal), normochromic (low-normal) anemia with low ferritin with low serum iron and low saturation and normal TIBC (high-normal) suspicious for iron deficiency anemia.  EDG/Colonoscopy in Jan 2018 demonstrates esophageal candidiasis and gastric erosions.  HISTORY OF PRESENT ILLNESS:   Katelyn Daniel is a 83 y.o. female with a medical history significant for AKI, candida esophagitis, HTN, hyperlipidemia, hypothyroidism who is referred to the [facility] for anemia.  The patient reports feeling fatigued and tired since Christmas of 2017.  In January 2018, the patient was admitted to the hospital on 06/12/2016 and discharged on 06/15/2016 for symptomatic anemia.  During her hospitalization, she underwent EGD/colonoscopy by Dr. Oneida Alar.  EGD demonstrated esophageal candidiasis and gastric erosions without any indication of recent bleeding and colonoscopy was unimpressive.  She was subsequently referred to hematology for management and evaluation of her anemia.  Other than fatigue and tiredness, the patient really denies any complaints.  She does report dark stools secondary to oral iron replacement therapy.  She is tolerating oral iron replacement without any difficulties at this point in time.  She denies any in the more common side effects of ferrous sulfate including nausea, vomiting, constipation, and abdominal pain.  She will therefore continue this medication at this time.  She denies any blood in her stools.  She denies any hemoptysis, hematuria.  She denies any pica and pagophagia.  She provided education regarding anemia and more specifically vitamin deficiency anemia.  Review of Systems    Constitutional: Positive for malaise/fatigue (fatigue). Negative for chills, fever and weight loss.  HENT: Negative.  Negative for nosebleeds.   Eyes: Negative.   Respiratory: Negative.  Negative for cough and hemoptysis.   Cardiovascular: Negative.  Negative for chest pain.  Gastrointestinal: Negative.  Negative for blood in stool, constipation, diarrhea, melena, nausea and vomiting.  Genitourinary: Negative.  Negative for hematuria.  Musculoskeletal: Negative.  Negative for falls.  Skin: Negative.   Neurological: Negative.  Negative for weakness.  Endo/Heme/Allergies: Negative.  Does not bruise/bleed easily.  Psychiatric/Behavioral: Negative.      PAST MEDICAL HISTORY:   Past Medical History:  Diagnosis Date  . Hyperlipemia   . Hypertension   . Iron deficiency anemia   . Thyroid disease     ALLERGIES: No Known Allergies    MEDICATIONS: I have reviewed the patient's current medications.    Current Outpatient Prescriptions on File Prior to Visit  Medication Sig Dispense Refill  . ferrous sulfate (FERROUSUL) 325 (65 FE) MG tablet Take 1 tablet (325 mg total) by mouth 2 (two) times daily with a meal. 60 tablet 3  . gabapentin (NEURONTIN) 300 MG capsule Take 300 mg by mouth at bedtime.    Marland Kitchen levothyroxine (SYNTHROID, LEVOTHROID) 50 MCG tablet Take 50 mcg by mouth daily.    . Multiple Vitamins-Minerals (PRESERVISION AREDS PO) Take 1 capsule by mouth daily.    . pantoprazole (PROTONIX) 40 MG tablet Take 1 tablet (40 mg total) by mouth 2 (two) times daily before a meal. 60 tablet 3  . pravastatin (PRAVACHOL) 40 MG tablet Take 40 mg by mouth daily.  3  . traZODone (DESYREL) 50 MG tablet Take 1 tablet (50  mg total) by mouth at bedtime as needed for sleep. 30 tablet 3   No current facility-administered medications on file prior to visit.      PAST SURGICAL HISTORY Past Surgical History:  Procedure Laterality Date  . COLONOSCOPY N/A 06/14/2016   Procedure: COLONOSCOPY;  Surgeon:  [name], MD;  Location: [facility];  Service: Endoscopy;  Laterality: N/A;  . ESOPHAGOGASTRODUODENOSCOPY N/A 06/14/2016   Procedure: ESOPHAGOGASTRODUODENOSCOPY (EGD);  Surgeon: [name], MD;  Location: [facility];  Service: Endoscopy;  Laterality: N/A;  WITH DILATION   . TONSILLECTOMY    . [facility] SINUS      FAMILY HISTORY: Family History  Problem Relation Age of Onset  . Asthma Mother   . Congestive Heart Failure Mother   . Emphysema Father   . Heart Problems Brother   . Colon cancer Neg Hx    She has two children, both sons, ages 80 and 70.  Her 53 year old son has many medical issues, but her 4 year old son is healthy.  SOCIAL HISTORY:  reports that she has never smoked. She has never used smokeless tobacco. She reports that she does not drink alcohol or use drugs.  She is retired from [employer].  She is Psychologist, forensic in religion.  She is married x 35 years following a divorce from her previous marriage.  Social History   Social History  . Marital status: Married    Spouse name: N/A  . Number of children: N/A  . Years of education: N/A   Social History Main Topics  . Smoking status: Never Smoker  . Smokeless tobacco: Never Used  . Alcohol use No  . Drug use: No  . Sexual activity: Not Currently   Other Topics Concern  . None   Social History Narrative  . None    PERFORMANCE STATUS: The patient's performance status is 1 - Symptomatic but completely ambulatory  PHYSICAL EXAM: Most Recent Vital Signs: Blood pressure (!) 155/94, pulse (!) 102, temperature 97.6 F (36.4 C), temperature source Oral, resp. rate 18, height 5' 7.5" (1.715 m), weight 142 lb (64.4 kg), SpO2 98 %. General appearance: alert, cooperative, appears stated age, no distress and Unaccompanied in exam room, but husband did come with her to appointment and remained in waiting room Head: Normocephalic, without obvious abnormality, atraumatic Eyes: negative findings: lids and lashes  normal, conjunctivae and sclerae normal and pupils equal, round, reactive to light and accomodation Throat: normal findings: lips normal without lesions and oropharynx pink & moist without lesions or evidence of thrush Neck: no adenopathy, supple, symmetrical, trachea midline and thyroid not enlarged, symmetric, no tenderness/mass/nodules Lungs: clear to auscultation bilaterally and normal percussion bilaterally Heart: regular rate and rhythm, S1, S2 normal, no murmur, click, rub or gallop Abdomen: soft, non-tender; bowel sounds normal; no masses,  no organomegaly Extremities: extremities normal, atraumatic, no cyanosis or edema and Valgus malformation of knees bilaterally. Skin: Skin color, texture, turgor normal. No rashes or lesions Lymph nodes: Cervical, supraclavicular, and axillary nodes normal. Neurologic: Grossly normal  LABORATORY DATA:  Results for orders placed or performed in visit on 07/26/16 (from the past 48 hour(s))  CBC with Differential     Status: Abnormal   Collection Time: 07/26/16 12:18 PM  Result Value Ref Range   WBC 6.1 4.0 - 10.5 K/uL   RBC 4.12 3.87 - 5.11 MIL/uL   Hemoglobin 12.1 12.0 - 15.0 g/dL   HCT 36.4 36.0 - 46.0 %  MCV 88.3 78.0 - 100.0 fL   MCH 29.4 26.0 - 34.0 pg   MCHC 33.2 30.0 - 36.0 g/dL   RDW 23.4 (H) 11.5 - 15.5 %   Platelets 328 150 - 400 K/uL   Neutrophils Relative % 62 %   Lymphocytes Relative 26 %   Monocytes Relative 9 %   Eosinophils Relative 2 %   Basophils Relative 1 %   Neutro Abs 3.8 1.7 - 7.7 K/uL   Lymphs Abs 1.6 0.7 - 4.0 K/uL   Monocytes Absolute 0.5 0.1 - 1.0 K/uL   Eosinophils Absolute 0.1 0.0 - 0.7 K/uL   Basophils Absolute 0.1 0.0 - 0.1 K/uL   RBC Morphology ANISOCYTES    Smear Review SPECIMEN CHECKED FOR CLOTS     Comment: PLATELET COUNT CONFIRMED BY SMEAR LARGE PLATELETS PRESENT GIANT PLATELETS SEEN   Basic metabolic panel     Status: Abnormal   Collection Time: 07/26/16 12:18 PM  Result Value Ref Range    Sodium 137 135 - 145 mmol/L   Potassium 4.2 3.5 - 5.1 mmol/L   Chloride 102 101 - 111 mmol/L   CO2 28 22 - 32 mmol/L   Glucose, Bld 91 65 - 99 mg/dL   BUN 26 (H) 6 - 20 mg/dL   Creatinine, Ser 1.22 (H) 0.44 - 1.00 mg/dL   Calcium 9.2 8.9 - 10.3 mg/dL   GFR calc non Af Amer 38 (L) >60 mL/min   GFR calc Af Amer 44 (L) >60 mL/min    Comment: (NOTE) The eGFR has been calculated using the CKD EPI equation. This calculation has not been validated in all clinical situations. eGFR's persistently <60 mL/min signify possible Chronic Kidney Disease.    Anion gap 7 5 - 15  Lactate dehydrogenase     Status: None   Collection Time: 07/26/16 12:18 PM  Result Value Ref Range   LDH 170 98 - 192 U/L  Sedimentation rate     Status: Abnormal   Collection Time: 07/26/16 12:18 PM  Result Value Ref Range   Sed Rate 23 (H) 0 - 22 mm/hr  Reticulocytes     Status: None   Collection Time: 07/26/16 12:18 PM  Result Value Ref Range   Retic Ct Pct 1.1 0.4 - 3.1 %   RBC. 4.12 3.87 - 5.11 MIL/uL   Retic Count, Manual 45.3 19.0 - 186.0 K/uL      RADIOGRAPHY: No results found.     PATHOLOGY:  N/A   ASSESSMENT/PLAN:   Iron deficiency anemia Normocytic (low-normal), normochromic (low-normal) anemia with low ferritin with low serum iron and low saturation and normal TIBC (high-normal) suspicious for iron deficiency anemia.  EDG/Colonoscopy in Jan 2018 demonstrates esophageal candidiasis and gastric erosions.  Labs today: CBC diff, BMET, anemia panel, retic count, LDH, ESR, CRP, haptoglobin, pathology smear review, SPEP + IFE, light chain assay.    Suspect iron deficiency anemia and therefore, based upon labs today, iron deficit calculation will guide dose of IV iron therapy in the near future.  Once replaced, we can discuss maintenance oral iron therapy.  I have discussed the risks, benefits, alternatives, and side effects of IV iron replacement therapy including, but not limited to, reaction at  venipuncture site, pruritis, rash, anaphylaxis, back pain, and death.  She is advised that risk of reaction to IV iron is very low.  She is provided patient education regarding iron deficiency anemia.  Iron deficiency anemia is the most common anemia.  Beside playing a critical role  as an oxygen carrier in the heme group of hemoglobin, iron is found in many key proteins in the cells, such as cytochromes and myoglobin, so it is not unexpected that a lack of iron has effects other than anemia.  Three studies have focused on nonanemic iron deficiency leading to fatigue.  Two studies showed that oral iron supplementation reduces fatigue, with no significant change in hemoglobin levels, in women with a ferritin level of less than 50 ng/mL, and a third study showed a lessening of fatigue with parental iron administration in women with a ferritin level of 15 ng/mL or less or an iron saturation of 20% or less.   Owing to obligate iron loss through menses, women are at greater risk for iron deficiency than men.  Iron loss in all women averages 1-3 ng per day, and dietary intake is often inadequate to maintain a positive iron balance.  A 1967 study showed that 25% of healthy, college-age women had no bone marrow iron stores and that another 33% had low stores.  Pregnancy adds to demands for iron, with requirements increasing to 6 ng per day by the end of pregnancy.  Athletes are another group at risk for iron deficiency.  Gastrointestinal tract blood is the source of iron loss, and exercise-induced hemolysis leads to urinary iron losses.  Decreased absorption of iron has also been implicated as a cause of iron deficiency, because of levels of hepcidin are often elevated in athletes owing to training-induced inflammation.    Obesity and its surgical treatment are also at risk factors for iron deficiency.  Obese patients are often iron-deficient, with increased hepcidin level being implicated in decreased absorption.   After bariatric surgery, the incidence of iron deficiency can be as high as 50%.  Because the main site of iron absorption is the duodenum, surgeries that involve bypassing this part of the bowel are associated with an increased incidence of iron deficiency.  However, iron deficiency is seen as a sequela of most types of bariatric surgery.    -NEJM Volume 371, No 14, pg 1325-1326   Return in 6 weeks for follow-up with repeat labs: CBC diff, BMET, iron/TIBC, ferritin.   ORDERS PLACED FOR THIS ENCOUNTER: Orders Placed This Encounter  Procedures  . CBC with Differential  . Basic metabolic panel  . Lactate dehydrogenase  . Sedimentation rate  . Pathologist smear review  . Kappa/lambda light chains  . IgG, IgA, IgM  . Immunofixation electrophoresis  . Protein electrophoresis, serum  . Vitamin B12  . Folate  . Iron and TIBC  . Ferritin  . Haptoglobin  . Reticulocytes  . C-reactive protein  . CBC with Differential  . Basic metabolic panel  . Iron and TIBC  . Ferritin  . Vitamin D 25 hydroxy    MEDICATIONS PRESCRIBED THIS ENCOUNTER: Meds ordered this encounter  Medications  . naproxen (NAPROSYN) 500 MG tablet  . Calcium Citrate-Vitamin D (CALCIUM CITRATE + D PO)    Sig: Take 1 tablet by mouth daily.    All questions were answered. The patient knows to call the clinic with any problems, questions or concerns. We can certainly see the patient much sooner if necessary.  Patient discussed with Dr. Talbert Cage and together we ascertained an up-to-date interval history, and examined the patient.  Dr. Talbert Cage developed the patient's assessment and plan.  This was a shared visit-consultation.  Her attestation will follow below.  This note is electronically signed by: Doy Mince 07/26/2016 5:40 PM

## 2016-07-26 NOTE — Assessment & Plan Note (Addendum)
Normocytic (low-normal), normochromic (low-normal) anemia with low ferritin with low serum iron and low saturation and normal TIBC (high-normal) suspicious for iron deficiency anemia.  EDG/Colonoscopy in Jan 2018 demonstrates esophageal candidiasis and gastric erosions.  Labs today: CBC diff, BMET, anemia panel, retic count, LDH, ESR, CRP, haptoglobin, pathology smear review, SPEP + IFE, light chain assay.    Suspect iron deficiency anemia and therefore, based upon labs today, iron deficit calculation will guide dose of IV iron therapy in the near future.  Once replaced, we can discuss maintenance oral iron therapy.  I have discussed the risks, benefits, alternatives, and side effects of IV iron replacement therapy including, but not limited to, reaction at venipuncture site, pruritis, rash, anaphylaxis, back pain, and death.  She is advised that risk of reaction to IV iron is very low.  She is provided patient education regarding iron deficiency anemia.  Iron deficiency anemia is the most common anemia.  Beside playing a critical role as an oxygen carrier in the heme group of hemoglobin, iron is found in many key proteins in the cells, such as cytochromes and myoglobin, so it is not unexpected that a lack of iron has effects other than anemia.  Three studies have focused on nonanemic iron deficiency leading to fatigue.  Two studies showed that oral iron supplementation reduces fatigue, with no significant change in hemoglobin levels, in women with a ferritin level of less than 50 ng/mL, and a third study showed a lessening of fatigue with parental iron administration in women with a ferritin level of 15 ng/mL or less or an iron saturation of 20% or less.   Owing to obligate iron loss through menses, women are at greater risk for iron deficiency than men.  Iron loss in all women averages 1-3 ng per day, and dietary intake is often inadequate to maintain a positive iron balance.  A 1967 study showed that  25% of healthy, college-age women had no bone marrow iron stores and that another 33% had low stores.  Pregnancy adds to demands for iron, with requirements increasing to 6 ng per day by the end of pregnancy.  Athletes are another group at risk for iron deficiency.  Gastrointestinal tract blood is the source of iron loss, and exercise-induced hemolysis leads to urinary iron losses.  Decreased absorption of iron has also been implicated as a cause of iron deficiency, because of levels of hepcidin are often elevated in athletes owing to training-induced inflammation.    Obesity and its surgical treatment are also at risk factors for iron deficiency.  Obese patients are often iron-deficient, with increased hepcidin level being implicated in decreased absorption.  After bariatric surgery, the incidence of iron deficiency can be as high as 50%.  Because the main site of iron absorption is the duodenum, surgeries that involve bypassing this part of the bowel are associated with an increased incidence of iron deficiency.  However, iron deficiency is seen as a sequela of most types of bariatric surgery.    -NEJM Volume 371, No 14, pg 1325-1326   Return in 6 weeks for follow-up with repeat labs: CBC diff, BMET, iron/TIBC, ferritin.

## 2016-07-27 ENCOUNTER — Other Ambulatory Visit (HOSPITAL_COMMUNITY): Payer: Self-pay | Admitting: Oncology

## 2016-07-27 LAB — HAPTOGLOBIN: HAPTOGLOBIN: 157 mg/dL (ref 34–200)

## 2016-07-27 LAB — PROTEIN ELECTROPHORESIS, SERUM
A/G RATIO SPE: 1.5 (ref 0.7–1.7)
Albumin ELP: 3.7 g/dL (ref 2.9–4.4)
Alpha-1-Globulin: 0.2 g/dL (ref 0.0–0.4)
Alpha-2-Globulin: 0.7 g/dL (ref 0.4–1.0)
Beta Globulin: 0.8 g/dL (ref 0.7–1.3)
GLOBULIN, TOTAL: 2.5 (ref 2.2–3.9)
Gamma Globulin: 0.7 g/dL (ref 0.4–1.8)
TOTAL PROTEIN ELP: 6.2 g/dL (ref 6.0–8.5)

## 2016-07-27 LAB — IGG, IGA, IGM
IGA: 106 mg/dL (ref 64–422)
IGM, SERUM: 45 mg/dL (ref 26–217)
IgG (Immunoglobin G), Serum: 763 mg/dL (ref 700–1600)

## 2016-07-27 LAB — VITAMIN B12: Vitamin B-12: >7500 pg/mL — ABNORMAL HIGH (ref 180–914)

## 2016-07-27 LAB — VITAMIN D 25 HYDROXY (VIT D DEFICIENCY, FRACTURES): VIT D 25 HYDROXY: 69.5 ng/mL (ref 30.0–100.0)

## 2016-07-27 LAB — KAPPA/LAMBDA LIGHT CHAINS
Kappa free light chain: 19.8 mg/L — ABNORMAL HIGH (ref 3.3–19.4)
Kappa, lambda light chain ratio: 0.86 (ref 0.26–1.65)
Lambda free light chains: 22.9 mg/L (ref 5.7–26.3)

## 2016-07-28 LAB — IMMUNOFIXATION ELECTROPHORESIS
IGM, SERUM: 42 mg/dL (ref 26–217)
IgA: 104 mg/dL (ref 64–422)
IgG (Immunoglobin G), Serum: 740 mg/dL (ref 700–1600)
Total Protein ELP: 6.5 g/dL (ref 6.0–8.5)

## 2016-07-31 ENCOUNTER — Encounter (HOSPITAL_BASED_OUTPATIENT_CLINIC_OR_DEPARTMENT_OTHER): Payer: Medicare Other

## 2016-07-31 ENCOUNTER — Encounter (HOSPITAL_COMMUNITY): Payer: Self-pay

## 2016-07-31 VITALS — BP 155/68 | HR 66 | Resp 16

## 2016-07-31 DIAGNOSIS — D649 Anemia, unspecified: Secondary | ICD-10-CM | POA: Diagnosis present

## 2016-07-31 DIAGNOSIS — D500 Iron deficiency anemia secondary to blood loss (chronic): Secondary | ICD-10-CM

## 2016-07-31 MED ORDER — SODIUM CHLORIDE 0.9 % IV SOLN
Freq: Once | INTRAVENOUS | Status: AC
  Administered 2016-07-31: 14:00:00 via INTRAVENOUS

## 2016-07-31 MED ORDER — FERUMOXYTOL INJECTION 510 MG/17 ML
510.0000 mg | Freq: Once | INTRAVENOUS | Status: AC
  Administered 2016-07-31: 510 mg via INTRAVENOUS
  Filled 2016-07-31: qty 17

## 2016-07-31 NOTE — Progress Notes (Signed)
feraheme given per orders. Patient tolerated it well, no issues. Vitals stable and discharged home ambulatory with husband.follow up as scheduled.

## 2016-07-31 NOTE — Patient Instructions (Signed)
[place] at [facility] Discharge Instructions  RECOMMENDATIONS MADE BY THE CONSULTANT AND ANY TEST RESULTS WILL BE SENT TO YOUR REFERRING PHYSICIAN.  Feraheme given  Follow up as scheduled.  Thank you for choosing [place] at [facility] to provide your oncology and hematology care.  To afford each patient quality time with our provider, please arrive at least 15 minutes before your scheduled appointment time.    If you have a lab appointment with the [facility] please come in thru the  Main Entrance and check in at the main information desk  You need to re-schedule your appointment should you arrive 10 or more minutes late.  We strive to give you quality time with our providers, and arriving late affects you and other patients whose appointments are after yours.  Also, if you no show three or more times for appointments you may be dismissed from the clinic at the providers discretion.     Again, thank you for choosing [facility].  Our hope is that these requests will decrease the amount of time that you wait before being seen by our physicians.       _____________________________________________________________  Should you have questions after your visit to [facility], please contact our office at [phone] between the hours of 8:30 a.m. and 4:30 p.m.  Voicemails left after 4:30 p.m. will not be returned until the following business day.  For prescription refill requests, have your pharmacy contact our office.       Resources For Cancer Patients and their Caregivers ? American Cancer Society: Can assist with transportation, wigs, general needs, runs Look Good Feel Better.        770-163-7018 ? Cancer Care: Provides financial assistance, online support groups, medication/co-pay assistance.  1-800-813-HOPE 806-043-9773) ? [place] Assists [place] cancer patients and their  families through emotional , educational and financial support.  (347)613-4816 ? [place] DSS Where to apply for food stamps, Medicaid and utility assistance. 903 635 0094 ? [facility]: Transportation to medical appointments. (725) 127-6572 ? Social Security Administration: May apply for disability if have a Stage IV cancer. 212-212-8438 6234584949 ? [employer], Disability and Transit Services: Assists with nutrition, care and transit needs. [place] Support Programs: '@10RELATIVEDAYS'$ @ > Cancer Support Group  2nd Tuesday of the month 1pm-2pm, Journey Room  > Creative Journey  3rd Tuesday of the month 1130am-1pm, Journey Room  > Look Good Feel Better  1st Wednesday of the month 10am-12 noon, Journey Room (Call [place] to register [phone])

## 2016-08-14 ENCOUNTER — Telehealth (HOSPITAL_COMMUNITY): Payer: Self-pay

## 2016-08-14 ENCOUNTER — Encounter (HOSPITAL_COMMUNITY): Payer: Medicare Other | Attending: Oncology

## 2016-08-14 ENCOUNTER — Other Ambulatory Visit (HOSPITAL_COMMUNITY): Payer: Self-pay

## 2016-08-14 DIAGNOSIS — D500 Iron deficiency anemia secondary to blood loss (chronic): Secondary | ICD-10-CM | POA: Diagnosis present

## 2016-08-14 DIAGNOSIS — D649 Anemia, unspecified: Secondary | ICD-10-CM | POA: Diagnosis not present

## 2016-08-14 DIAGNOSIS — R531 Weakness: Secondary | ICD-10-CM | POA: Insufficient documentation

## 2016-08-14 LAB — BASIC METABOLIC PANEL
ANION GAP: 8 (ref 5–15)
BUN: 21 mg/dL — ABNORMAL HIGH (ref 6–20)
CHLORIDE: 103 mmol/L (ref 101–111)
CO2: 27 mmol/L (ref 22–32)
Calcium: 9.2 mg/dL (ref 8.9–10.3)
Creatinine, Ser: 1.19 mg/dL — ABNORMAL HIGH (ref 0.44–1.00)
GFR calc non Af Amer: 39 mL/min — ABNORMAL LOW (ref >60)
GFR, EST AFRICAN AMERICAN: 45 mL/min — AB (ref >60)
Glucose, Bld: 94 mg/dL (ref 65–99)
Potassium: 3.7 mmol/L (ref 3.5–5.1)
Sodium: 138 mmol/L (ref 135–145)

## 2016-08-14 LAB — CBC WITH DIFFERENTIAL/PLATELET
BASOS ABS: 0 10*3/uL (ref 0.0–0.1)
Basophils Relative: 0 %
EOS PCT: 1 %
Eosinophils Absolute: 0.1 10*3/uL (ref 0.0–0.7)
HCT: 37.5 % (ref 36.0–46.0)
HEMOGLOBIN: 13.1 g/dL (ref 12.0–15.0)
LYMPHS PCT: 21 %
Lymphs Abs: 1.4 10*3/uL (ref 0.7–4.0)
MCH: 31.8 pg (ref 26.0–34.0)
MCHC: 34.9 g/dL (ref 30.0–36.0)
MCV: 91 fL (ref 78.0–100.0)
MONOS PCT: 10 %
Monocytes Absolute: 0.7 10*3/uL (ref 0.1–1.0)
NEUTROS ABS: 4.7 10*3/uL (ref 1.7–7.7)
Neutrophils Relative %: 68 %
Platelets: 319 10*3/uL (ref 150–400)
RBC: 4.12 MIL/uL (ref 3.87–5.11)
RDW: 21.7 % — ABNORMAL HIGH (ref 11.5–15.5)
WBC: 6.9 10*3/uL (ref 4.0–10.5)

## 2016-08-14 LAB — IRON AND TIBC
Iron: 89 ug/dL (ref 28–170)
SATURATION RATIOS: 30 % (ref 10.4–31.8)
TIBC: 294 ug/dL (ref 250–450)
UIBC: 205 ug/dL

## 2016-08-14 LAB — FERRITIN: FERRITIN: 182 ng/mL (ref 11–307)

## 2016-08-14 NOTE — Telephone Encounter (Signed)
Patient called stating she was SOB, had swollen legs, having trouble getting around, and fatigued. She recently had 2 units PRBC's, Iron infusion and takes oral Iron daily. Reviewed with PA-C, who ordered labs. If labs are okay , she needs to follow up with her PCP. Labs appt made for later this am. Patient and daughter -in-law verbalized understanding.

## 2016-08-22 ENCOUNTER — Ambulatory Visit (HOSPITAL_COMMUNITY)
Admission: RE | Admit: 2016-08-22 | Discharge: 2016-08-22 | Disposition: A | Discharge status: Home / Self Care | Payer: Medicare Other | Source: Ambulatory Visit | Attending: Family Medicine | Admitting: Family Medicine

## 2016-08-22 ENCOUNTER — Other Ambulatory Visit (HOSPITAL_COMMUNITY): Payer: Self-pay | Admitting: Family Medicine

## 2016-08-22 DIAGNOSIS — I083 Combined rheumatic disorders of mitral, aortic and tricuspid valves: Secondary | ICD-10-CM | POA: Diagnosis not present

## 2016-08-22 DIAGNOSIS — I700 Atherosclerosis of aorta: Secondary | ICD-10-CM | POA: Insufficient documentation

## 2016-08-22 DIAGNOSIS — I351 Nonrheumatic aortic (valve) insufficiency: Secondary | ICD-10-CM

## 2016-08-22 DIAGNOSIS — I313 Pericardial effusion (noninflammatory): Secondary | ICD-10-CM | POA: Insufficient documentation

## 2016-08-22 LAB — ECHOCARDIOGRAM COMPLETE
AO mean calculated velocity dopler: 100 cm/s
AOPV: 0.53 m/s
AOVTI: 32.1 cm
AV Area mean vel: 1.46 cm2
AV Mean grad: 5 mmHg
AV Peak grad: 9 mmHg
AV VEL mean LVOT/AV: 0.52
AV area mean vel ind: 0.83 cm2/m2
AV peak Index: 0.85
AV pk vel: 149 cm/s
AVAREAVTI: 1.49 cm2
AVAREAVTIIND: 0.86 cm2/m2
AVLVOTPG: 2 mmHg
AVPHT: 478 ms
CHL CUP AV VEL: 1.5
CHL CUP MV DEC (S): 211
CHL CUP STROKE VOLUME: 51 mL
E decel time: 211 msec
E/e' ratio: 13.34
FS: 24 % — AB (ref 28–44)
IVS/LV PW RATIO, ED: 0.88
LA ID, A-P, ES: 31 mm
LA diam end sys: 31 mm
LA diam index: 1.77 cm/m2
LA vol index: 35.1 mL/m2
LAVOL: 61.5 mL
LAVOLA4C: 51 mL
LV E/e'average: 13.34
LV SIMPSON'S DISK: 52
LV TDI E'LATERAL: 6.2
LV TDI E'MEDIAL: 5.11
LV dias vol: 98 mL (ref 46–106)
LVDIAVOLIN: 56 mL/m2
LVEEMED: 13.34
LVELAT: 6.2 cm/s
LVOT VTI: 17 cm
LVOT area: 2.84 cm2
LVOT diameter: 19 mm
LVOT peak VTI: 0.53 cm
LVOT peak vel: 78.3 cm/s
LVOTSV: 48 mL
LVSYSVOL: 47 mL — AB (ref 14–42)
LVSYSVOLIN: 27 mL/m2
Lateral S' vel: 9.46 cm/s
MRPISAEROA: 0.06 cm2
MV VTI: 189 cm
MV pk E vel: 82.7 m/s
MVPG: 3 mmHg
MVPKAVEL: 109 m/s
PW: 13.6 mm — AB (ref 0.6–1.1)
RV sys press: 34 mmHg
Reg peak vel: 277 cm/s
TAPSE: 18.8 mm
TR max vel: 277 cm/s
Valve area index: 0.86
Valve area: 1.5 cm2

## 2016-08-22 NOTE — Progress Notes (Signed)
*  PRELIMINARY RESULTS* Echocardiogram 2D Echocardiogram has been performed.  Samuel Germany 08/22/2016, 2:45 PM

## 2016-09-04 NOTE — Progress Notes (Signed)
Cardiology Office Note   Date:  09/05/2016   ID:  Katelyn Daniel, DOB 11-20-1923, MRN [id number]  PCP:  [name], MD  Cardiologist:   [name], MD   No chief complaint on file.     History of Present Illness: Katelyn Daniel is a 83 y.o. female who presents for evaluation/consultation for cardiac valve disease Referred by Dr [name] Reviewed her echo from 08/22/16 done for aortic insufficiency. Interestingly EF reported as normal on echo 06/13/16 with AV sclerosis and mild AR then   EF 45-50%  Mild AR and mean gradient only 5 mmHg Mild MR Estimated PA pressure 34 mmHg   CRF;s age , elevated lipids and HTN  She has had an anemia that contributes to dyspnea Improved with iron and to see Dr Whitney Muse this week Chronic LE edema not on diuretic. No PND/orhtopnea. Denies chest pain  She has support stockings To wear for edema but they are hard for her to get on  Two sons with her in office today and seem attentive   Past Medical History:  Diagnosis Date  . Hyperlipemia   . Hypertension   . Iron deficiency anemia   . Thyroid disease     Past Surgical History:  Procedure Laterality Date  . COLONOSCOPY N/A 06/14/2016   Procedure: COLONOSCOPY;  Surgeon: [name], MD;  [facility];  Service: Endoscopy;  Laterality: N/A;  . ESOPHAGOGASTRODUODENOSCOPY N/A 06/14/2016   Procedure: ESOPHAGOGASTRODUODENOSCOPY (EGD);  Surgeon: [name], MD;  Location[facility];  Service: Endoscopy;  Laterality: N/A;  WITH DILATION   . TONSILLECTOMY    . [facility] SINUS       Current Outpatient Prescriptions  Medication Sig Dispense Refill  . Calcium Citrate-Vitamin D (CALCIUM CITRATE + D PO) Take 1 tablet by mouth daily.    . ferrous sulfate (FERROUSUL) 325 (65 FE) MG tablet Take 1 tablet (325 mg total) by mouth 2 (two) times daily with a meal. 60 tablet 3  . gabapentin (NEURONTIN) 300 MG capsule Take 300 mg by mouth at bedtime.    Marland Kitchen levothyroxine (SYNTHROID,  LEVOTHROID) 50 MCG tablet Take 50 mcg by mouth daily.    . Multiple Vitamins-Minerals (PRESERVISION AREDS PO) Take 1 capsule by mouth daily.    . naproxen (NAPROSYN) 500 MG tablet     . pantoprazole (PROTONIX) 40 MG tablet Take 1 tablet (40 mg total) by mouth 2 (two) times daily before a meal. 60 tablet 3  . pravastatin (PRAVACHOL) 40 MG tablet Take 40 mg by mouth daily.  3  . traZODone (DESYREL) 50 MG tablet Take 1 tablet (50 mg total) by mouth at bedtime as needed for sleep. 30 tablet 3  . hydrochlorothiazide (MICROZIDE) 12.5 MG capsule Take 1 capsule (12.5 mg total) by mouth daily. 90 capsule 3   No current facility-administered medications for this visit.     Allergies:   Patient has no known allergies.    Social History:  The patient  reports that she has never smoked. She has never used smokeless tobacco. She reports that she does not drink alcohol or use drugs.   Family History:  The patient's family history includes Asthma in her mother; Congestive Heart Failure in her mother; Emphysema in her father; Heart Problems in her brother.    ROS:  Please see the history of present illness.   Otherwise, review of systems are positive for none.   All other systems are reviewed and negative.  PHYSICAL EXAM: VS:  BP 114/60 (BP Location: Right Arm)   Pulse 92   Ht 5' 7.5" (1.715 m)   Wt 140 lb (63.5 kg)   SpO2 97%   BMI 21.60 kg/m  , BMI Body mass index is 21.6 kg/m. Affect appropriate Healthy:  appears stated age 25: normal Neck supple with no adenopathy JVP normal no bruits no thyromegaly Lungs clear with no wheezing and good diaphragmatic motion Heart:  S1/S2 SEM murmur, no rub, gallop or click PMI normal Abdomen: benighn, BS positve, no tenderness, no AAA no bruit.  No HSM or HJR Distal pulses intact with no bruits Plus one bilateral edema Neuro non-focal Skin warm and dry No muscular weakness    EKG:  NSR no acute changes    Recent Labs: 06/12/2016: ALT 13;  TSH 3.339 06/14/2016: Magnesium 2.1 08/14/2016: BUN 21; Creatinine, Ser 1.19; Hemoglobin 13.1; Platelets 319; Potassium 3.7; Sodium 138    Lipid Panel No results found for: CHOL, TRIG, HDL, CHOLHDL, VLDL, LDLCALC, LDLDIRECT    Wt Readings from Last 3 Encounters:  09/05/16 140 lb (63.5 kg)  07/26/16 142 lb (64.4 kg)  06/12/16 144 lb (65.3 kg)      Other studies Reviewed: Additional studies/ records that were reviewed today include: Notes Dr Frances Furbish labs in [place] and echo done [facility] .08/22/16 and 06/13/16     ASSESSMENT AND PLAN:  1. Aortic Valve Disease: mild should not be clinically significant cannot hear AR on exam 2. Decreased EF not clear why noted change in such short period of time  Will not add ACE at this time 3. HTN: BP on low side and needs diuretic  4. Elevated lipids  On statin labs with primary LFTls normal 06/12/16  5 Thyroid:  On replacement labs with primary TSH 3.3 06/12/16 6. Edema:  Start HCTZ 12.5 daily check BMET/BNP in 2 weeks f/u with NP/AP doc in 4-6 weeks  7. Anemia improved with iron has f/u with hematology this week  Lab Results  Component Value Date   HCT 37.5 08/14/2016     Current medicines are reviewed at length with the patient today.  The patient does not have concerns regarding medicines.  The following changes have been made:  HCTZ 12.5 daily   Labs/ tests ordered today include: BMET BNP 2 weeks   Orders Placed This Encounter  Procedures  . B Nat Peptide  . Basic Metabolic Panel (BMET)     Disposition:   FU with NP/AP doc 4-6 weeks      Signed, [name], MD  09/05/2016 9:43 AM    [facility] [address], [place], [place]  [address] Phone: [phone]; Fax: [phone]

## 2016-09-05 ENCOUNTER — Encounter: Payer: Self-pay | Admitting: Gastroenterology

## 2016-09-05 ENCOUNTER — Encounter: Payer: Self-pay | Admitting: Cardiovascular Disease

## 2016-09-05 ENCOUNTER — Ambulatory Visit (INDEPENDENT_AMBULATORY_CARE_PROVIDER_SITE_OTHER): Payer: Medicare Other | Admitting: Cardiovascular Disease

## 2016-09-05 ENCOUNTER — Encounter (HOSPITAL_COMMUNITY): Payer: Medicare Other

## 2016-09-05 ENCOUNTER — Encounter (HOSPITAL_COMMUNITY): Payer: Medicare Other | Attending: Oncology | Admitting: Oncology

## 2016-09-05 ENCOUNTER — Encounter (HOSPITAL_COMMUNITY): Payer: Self-pay | Admitting: Oncology

## 2016-09-05 VITALS — BP 114/60 | HR 92 | Ht 67.5 in | Wt 140.0 lb

## 2016-09-05 DIAGNOSIS — D500 Iron deficiency anemia secondary to blood loss (chronic): Secondary | ICD-10-CM

## 2016-09-05 DIAGNOSIS — B3781 Candidal esophagitis: Secondary | ICD-10-CM | POA: Diagnosis not present

## 2016-09-05 DIAGNOSIS — Z79899 Other long term (current) drug therapy: Secondary | ICD-10-CM | POA: Diagnosis not present

## 2016-09-05 DIAGNOSIS — K259 Gastric ulcer, unspecified as acute or chronic, without hemorrhage or perforation: Secondary | ICD-10-CM

## 2016-09-05 LAB — BASIC METABOLIC PANEL
Anion gap: 7 (ref 5–15)
BUN: 23 mg/dL — ABNORMAL HIGH (ref 6–20)
CO2: 27 mmol/L (ref 22–32)
Calcium: 9 mg/dL (ref 8.9–10.3)
Chloride: 104 mmol/L (ref 101–111)
Creatinine, Ser: 1.21 mg/dL — ABNORMAL HIGH (ref 0.44–1.00)
GFR, EST AFRICAN AMERICAN: 44 mL/min — AB (ref >60)
GFR, EST NON AFRICAN AMERICAN: 38 mL/min — AB (ref >60)
GLUCOSE: 98 mg/dL (ref 65–99)
Potassium: 3.8 mmol/L (ref 3.5–5.1)
Sodium: 138 mmol/L (ref 135–145)

## 2016-09-05 LAB — CBC WITH DIFFERENTIAL/PLATELET
BASOS ABS: 0 10*3/uL (ref 0.0–0.1)
BASOS PCT: 0 %
Eosinophils Absolute: 0.1 10*3/uL (ref 0.0–0.7)
Eosinophils Relative: 1 %
HEMATOCRIT: 38.6 % (ref 36.0–46.0)
HEMOGLOBIN: 12.8 g/dL (ref 12.0–15.0)
LYMPHS PCT: 19 %
Lymphs Abs: 1.3 10*3/uL (ref 0.7–4.0)
MCH: 31.5 pg (ref 26.0–34.0)
MCHC: 33.2 g/dL (ref 30.0–36.0)
MCV: 95.1 fL (ref 78.0–100.0)
Monocytes Absolute: 0.7 10*3/uL (ref 0.1–1.0)
Monocytes Relative: 11 %
NEUTROS ABS: 4.9 10*3/uL (ref 1.7–7.7)
NEUTROS PCT: 69 %
Platelets: 313 10*3/uL (ref 150–400)
RBC: 4.06 MIL/uL (ref 3.87–5.11)
RDW: 18.4 % — ABNORMAL HIGH (ref 11.5–15.5)
WBC: 7.1 10*3/uL (ref 4.0–10.5)

## 2016-09-05 LAB — IRON AND TIBC
Iron: 45 ug/dL (ref 28–170)
SATURATION RATIOS: 16 % (ref 10.4–31.8)
TIBC: 286 ug/dL (ref 250–450)
UIBC: 241 ug/dL

## 2016-09-05 LAB — FERRITIN: Ferritin: 68 ng/mL (ref 11–307)

## 2016-09-05 MED ORDER — HYDROCHLOROTHIAZIDE 12.5 MG PO CAPS: 12.5000 mg | ORAL_CAPSULE | Freq: Every day | ORAL | 3 refills | Status: DC

## 2016-09-05 MED ORDER — POLYSACCHAR IRON-FA-B12 150-1-25 MG-MG-MCG PO CAPS: 1.0000 | ORAL_CAPSULE | Freq: Every day | ORAL | 11 refills | Status: DC

## 2016-09-05 NOTE — Patient Instructions (Signed)
Medication Instructions:  START HCTZ 12.5 MG DAILY   Labwork: Your physician recommends that you return for lab work in: 2 WEEKS  BNP BMET   Testing/Procedures: NONE  Follow-Up: Your physician recommends that you schedule a follow-up appointment in: 2 WEEKS    Any Other Special Instructions Will Be Listed Below (If Applicable).     If you need a refill on your cardiac medications before your next appointment, please call your pharmacy.

## 2016-09-05 NOTE — Progress Notes (Signed)
[name], MD [place] [address]  Iron deficiency anemia due to chronic blood loss - Plan: CBC with Differential, Basic metabolic panel, Ferritin, Iron and TIBC, Polysacchar Iron-FA-B12 (FERREX 150 FORTE) 150-1-25 MG-MG-MCG CAPS  CURRENT THERAPY:  IV iron replacement therapy  INTERVAL HISTORY: Katelyn Daniel 83 y.o. female returns for followup of iron deficiency anemia requiring IV iron replacement.   She denies any significant improvement in her overall well-being since receiving IV iron replacement therapy.  She denies any blood in her stools or black stools.  She denies any blood loss that she can identify.  Her appetite is a 75%.  She reports fatigue shortness of breath, bilateral leg swelling, easy bruising, and issues with insomnia.  She seen by cardiology earlier this morning.  She was started on HCTZ for her lower extremity edema.  Aortic valve disease is mild and clinically insignificant.  She is educated  that with correction of her iron and normalization of her hemoglobin, I cannot explain her fatigue and other complaints that she expresses today.  I have advised her to follow-up with her primary care physician accordingly.  She is upset today about her long wait time from check-in to being seen in the office.  He was checked into the clinic at 1004 hours for a 1150 hours appointment because she had a cardiology appointment earlier this morning.  Patient was seen  for her follow-up appointment at 1200 hrs.  Patient is reminded of her appointment time and her frustration is unfounded.  Review of Systems  Constitutional: Positive for malaise/fatigue. Negative for chills, fever and weight loss.  HENT: Negative.   Eyes: Negative.   Respiratory: Positive for shortness of breath. Negative for cough.   Cardiovascular: Negative.  Negative for chest pain.  Gastrointestinal: Negative.  Negative for blood in stool, constipation, diarrhea,  melena, nausea and vomiting.  Genitourinary: Negative.   Musculoskeletal: Negative.   Skin: Negative.   Neurological: Negative.  Negative for weakness.  Endo/Heme/Allergies: Bruises/bleeds easily.  Psychiatric/Behavioral: The patient has insomnia.     Past Medical History:  Diagnosis Date  . Hyperlipemia   . Hypertension   . Iron deficiency anemia   . Thyroid disease     Past Surgical History:  Procedure Laterality Date  . COLONOSCOPY N/A 06/14/2016   Procedure: COLONOSCOPY;  Surgeon: [name], MD;  [facility];  Service: Endoscopy;  Laterality: N/A;  . ESOPHAGOGASTRODUODENOSCOPY N/A 06/14/2016   Procedure: ESOPHAGOGASTRODUODENOSCOPY (EGD);  Surgeon: [name], MD;  [facility];  Service: Endoscopy;  Laterality: N/A;  WITH DILATION   . TONSILLECTOMY    . [facility] SINUS      Family History  Problem Relation Age of Onset  . Asthma Mother   . Congestive Heart Failure Mother   . Emphysema Father   . Heart Problems Brother   . Colon cancer Neg Hx     Social History   Social History  . Marital status: Married    Spouse name: N/A  . Number of children: N/A  . Years of education: N/A   Social History Main Topics  . Smoking status: Never Smoker  . Smokeless tobacco: Never Used  . Alcohol use No  . Drug use: No  . Sexual activity: Not Currently   Other Topics Concern  . None   Social History Narrative  . None     PHYSICAL EXAMINATION  ECOG PERFORMANCE STATUS: 1 - Symptomatic  but completely ambulatory  Vitals:   09/05/16 1053  BP: 122/84  Pulse: 94  Resp: 18  Temp: 97.5 F (36.4 C)    GENERAL:alert, no distress, well nourished, well developed, comfortable, cooperative and accompanied by 2 family members, frustrated due to her long wait (she was checked into the clinic at 1004 hours for a 1150 hour appt). SKIN: skin color, texture, turgor are normal, no rashes or significant lesions HEAD: Normocephalic, No masses, lesions,  tenderness or abnormalities EYES: normal, EOMI, Conjunctiva are pink and non-injected EARS: External ears normal OROPHARYNX:lips, buccal mucosa, and tongue normal and mucous membranes are moist  NECK: supple, trachea midline LYMPH:  no palpable lymphadenopathy BREAST:not examined LUNGS: clear to auscultation  HEART: regular rate & rhythm, no murmurs, no gallops, S1 normal and S2 normal ABDOMEN:abdomen soft, non-tender and normal bowel sounds BACK: Back symmetric, no curvature., No CVA tenderness EXTREMITIES:less then 2 second capillary refill, no joint deformities, effusion, or inflammation, no skin discoloration, no cyanosis  NEURO: alert & oriented x 3 with fluent speech, no focal motor/sensory deficits, gait normal   LABORATORY DATA: CBC    Component Value Date/Time   WBC 7.1 09/05/2016 0950   RBC 4.06 09/05/2016 0950   HGB 12.8 09/05/2016 0950   HCT 38.6 09/05/2016 0950   PLT 313 09/05/2016 0950   MCV 95.1 09/05/2016 0950   MCH 31.5 09/05/2016 0950   MCHC 33.2 09/05/2016 0950   RDW 18.4 (H) 09/05/2016 0950   LYMPHSABS 1.3 09/05/2016 0950   MONOABS 0.7 09/05/2016 0950   EOSABS 0.1 09/05/2016 0950   BASOSABS 0.0 09/05/2016 0950      Chemistry      Component Value Date/Time   NA 138 09/05/2016 0950   K 3.8 09/05/2016 0950   CL 104 09/05/2016 0950   CO2 27 09/05/2016 0950   BUN 23 (H) 09/05/2016 0950   CREATININE 1.21 (H) 09/05/2016 0950      Component Value Date/Time   CALCIUM 9.0 09/05/2016 0950   ALKPHOS 56 06/12/2016 1111   AST 18 06/12/2016 1111   ALT 13 (L) 06/12/2016 1111   BILITOT 0.4 06/12/2016 1111        PENDING LABS:   RADIOGRAPHIC STUDIES:  No results found.   PATHOLOGY:    ASSESSMENT AND PLAN:  Iron deficiency anemia Iron deficiency anemia with EDG/Colonoscopy in Jan 2018 demonstrating esophageal candidiasis and gastric erosions.  IV iron therapy was required given her deficit.  Labs today: CBC diff, BMET, iron/TIBC, ferritin.  I  personally reviewed and went over laboratory results with the patient.  The results are noted within this dictation.  Anemia has been corrected.  Thrombocytosis is resolved.  Iron studies demonstrate a ferritin of 68 and normal iron sat, TIBC, and serum iron.  Labs from 08/14/2016 are reviewed as well demonstrating resolution of iron deficiency.  I have recommended PO iron therapy to prevent the need for IV iron therapy in the future (or decrease the frequency of).  Rx is printed for ferrex forte.  I have reviewed the risks, benefits, alternatives, and side effects of oral iron including, but not limited to, rash, constipation, diarrhea, abdominal pain, nausea, vomiting, darkening of stool.   Labs in 3 months: CBC diff, BMET, iron/TIBC, ferritin.  Problem list reviewed with patient and edited accordingly.  Medications are reviewed with the patient and edited accordingly.  Return in 3 months for follow-up.   More than 50% of the time spent with the patient was utilized for counseling and coordination  of care.   ORDERS PLACED FOR THIS ENCOUNTER: Orders Placed This Encounter  Procedures  . CBC with Differential  . Basic metabolic panel  . Ferritin  . Iron and TIBC    MEDICATIONS PRESCRIBED THIS ENCOUNTER: Meds ordered this encounter  Medications  . Polysacchar Iron-FA-B12 (FERREX 150 FORTE) 150-1-25 MG-MG-MCG CAPS    Sig: Take 1 capsule by mouth daily.    Dispense:  30 capsule    Refill:  11    Order Specific Question:   Supervising Provider    Answer:   Brunetta Genera [[id number]]    THERAPY PLAN:  She will start PO iron, ferrex forte, and we will monitor her iron studies moving forward.  Will provide IV iron replacement when indicated.  All questions were answered. The patient knows to call the clinic with any problems, questions or concerns. We can certainly see the patient much sooner if necessary.  Patient and plan discussed with Dr. Twana First and she is in agreement  with the aforementioned.   This note is electronically signed by: Doy Mince 09/05/2016 5:12 PM

## 2016-09-05 NOTE — Patient Instructions (Signed)
[place] at [facility] Discharge Instructions  RECOMMENDATIONS MADE BY THE CONSULTANT AND ANY TEST RESULTS WILL BE SENT TO YOUR REFERRING PHYSICIAN.  You were seen today by Kirby Crigler PA-C. Stop taking over the counter iron. Rx given for Ferrex forte. Return in 3 months for labs and follow up.   Thank you for choosing [place] at [facility] to provide your oncology and hematology care.  To afford each patient quality time with our provider, please arrive at least 15 minutes before your scheduled appointment time.    If you have a lab appointment with the [facility] please come in thru the  Main Entrance and check in at the main information desk  You need to re-schedule your appointment should you arrive 10 or more minutes late.  We strive to give you quality time with our providers, and arriving late affects you and other patients whose appointments are after yours.  Also, if you no show three or more times for appointments you may be dismissed from the clinic at the providers discretion.     Again, thank you for choosing [facility].  Our hope is that these requests will decrease the amount of time that you wait before being seen by our physicians.       _____________________________________________________________  Should you have questions after your visit to [facility], please contact our office at [phone] between the hours of 8:30 a.m. and 4:30 p.m.  Voicemails left after 4:30 p.m. will not be returned until the following business day.  For prescription refill requests, have your pharmacy contact our office.       Resources For Cancer Patients and their Caregivers ? American Cancer Society: Can assist with transportation, wigs, general needs, runs Look Good Feel Better.        430 039 3222 ? Cancer Care: Provides financial assistance, online support groups, medication/co-pay assistance.   1-800-813-HOPE 951 207 2448) ? [place] Assists [place] cancer patients and their families through emotional , educational and financial support.  817-370-0112 ? [place] DSS Where to apply for food stamps, Medicaid and utility assistance. 7626576662 ? [facility]: Transportation to medical appointments. 616 009 7796 ? Social Security Administration: May apply for disability if have a Stage IV cancer. 769-271-8435 904-311-0847 ? [employer], Disability and Transit Services: Assists with nutrition, care and transit needs. [place] Support Programs: '@10RELATIVEDAYS'$ @ > Cancer Support Group  2nd Tuesday of the month 1pm-2pm, Journey Room  > Creative Journey  3rd Tuesday of the month 1130am-1pm, Journey Room  > Look Good Feel Better  1st Wednesday of the month 10am-12 noon, Journey Room (Call [facility] to register [phone])

## 2016-09-05 NOTE — Assessment & Plan Note (Addendum)
Iron deficiency anemia with EDG/Colonoscopy in Jan 2018 demonstrating esophageal candidiasis and gastric erosions.  IV iron therapy was required given her deficit.  Labs today: CBC diff, BMET, iron/TIBC, ferritin.  I personally reviewed and went over laboratory results with the patient.  The results are noted within this dictation.  Anemia has been corrected.  Thrombocytosis is resolved.  Iron studies demonstrate a ferritin of 68 and normal iron sat, TIBC, and serum iron.  Labs from 08/14/2016 are reviewed as well demonstrating resolution of iron deficiency.  I have recommended PO iron therapy to prevent the need for IV iron therapy in the future (or decrease the frequency of).  Rx is printed for ferrex forte.  I have reviewed the risks, benefits, alternatives, and side effects of oral iron including, but not limited to, rash, constipation, diarrhea, abdominal pain, nausea, vomiting, darkening of stool.   Labs in 3 months: CBC diff, BMET, iron/TIBC, ferritin.  Problem list reviewed with patient and edited accordingly.  Medications are reviewed with the patient and edited accordingly.  Return in 3 months for follow-up.   More than 50% of the time spent with the patient was utilized for counseling and coordination of care.

## 2016-09-19 ENCOUNTER — Ambulatory Visit (HOSPITAL_COMMUNITY)
Admission: RE | Admit: 2016-09-19 | Discharge: 2016-09-19 | Disposition: A | Discharge status: Home / Self Care | Payer: Medicare Other | Source: Ambulatory Visit | Attending: Adult Health | Admitting: Adult Health

## 2016-09-19 ENCOUNTER — Ambulatory Visit (INDEPENDENT_AMBULATORY_CARE_PROVIDER_SITE_OTHER): Payer: Medicare Other | Admitting: Adult Health

## 2016-09-19 ENCOUNTER — Encounter: Payer: Self-pay | Admitting: Adult Health

## 2016-09-19 VITALS — BP 132/80 | HR 71 | Ht 67.0 in | Wt 139.0 lb

## 2016-09-19 DIAGNOSIS — I358 Other nonrheumatic aortic valve disorders: Secondary | ICD-10-CM | POA: Diagnosis not present

## 2016-09-19 DIAGNOSIS — R918 Other nonspecific abnormal finding of lung field: Secondary | ICD-10-CM | POA: Insufficient documentation

## 2016-09-19 DIAGNOSIS — R0989 Other specified symptoms and signs involving the circulatory and respiratory systems: Secondary | ICD-10-CM

## 2016-09-19 DIAGNOSIS — J90 Pleural effusion, not elsewhere classified: Secondary | ICD-10-CM | POA: Diagnosis not present

## 2016-09-19 NOTE — Patient Instructions (Addendum)
Your physician recommends that you schedule a follow-up appointment in: 3 Months  Your physician recommends that you continue on your current medications as directed. Please refer to the Current Medication list given to you today.  Your provider recommends that you take your Symbicort as prescribed   Your physician recommends that you schedule a follow-up appointment with Dr. [name] regarding possible pulmonary consult.   If you need a refill on your cardiac medications before your next appointment, please call your pharmacy.  Thank you for choosing [facility]!

## 2016-09-19 NOTE — Progress Notes (Deleted)
Name: Katelyn Daniel    DOB: [birth year]  Age: 83 y.o.  MR#: [id number]       PCP:  [name], MD      Insurance: Payor: MEDICARE / Plan: MEDICARE PART A AND B / Product Type: *No Product type* /   CC:   No chief complaint on file.   VS Vitals:   09/19/16 1442  BP: 132/80  Pulse: 71  SpO2: 97%  Weight: 139 lb (63 kg)  Height: '5\' 7"'$  (1.702 m)    Weights Current Weight  09/19/16 139 lb (63 kg)  09/05/16 141 lb (64 kg)  09/05/16 140 lb (63.5 kg)    Blood Pressure  BP Readings from Last 3 Encounters:  09/19/16 132/80  09/05/16 122/84  09/05/16 114/60     Admit date:  (Not on file) Last encounter with RMR:  Visit date not found   Allergy Patient has no known allergies.  Current Outpatient Prescriptions  Medication Sig Dispense Refill  . Calcium Citrate-Vitamin D (CALCIUM CITRATE + D PO) Take 1 tablet by mouth daily.    Marland Kitchen gabapentin (NEURONTIN) 300 MG capsule Take 300 mg by mouth at bedtime.    . hydrochlorothiazide (MICROZIDE) 12.5 MG capsule Take 1 capsule (12.5 mg total) by mouth daily. 90 capsule 3  . levothyroxine (SYNTHROID, LEVOTHROID) 50 MCG tablet Take 50 mcg by mouth daily.    . Multiple Vitamins-Minerals (PRESERVISION AREDS PO) Take 1 capsule by mouth daily.    . naproxen (NAPROSYN) 500 MG tablet     . pantoprazole (PROTONIX) 40 MG tablet Take 1 tablet (40 mg total) by mouth 2 (two) times daily before a meal. 60 tablet 3  . Polysacchar Iron-FA-B12 (FERREX 150 FORTE) 150-1-25 MG-MG-MCG CAPS Take 1 capsule by mouth daily. 30 capsule 11  . pravastatin (PRAVACHOL) 40 MG tablet Take 40 mg by mouth daily.  3  . traZODone (DESYREL) 50 MG tablet Take 1 tablet (50 mg total) by mouth at bedtime as needed for sleep. 30 tablet 3   No current facility-administered medications for this visit.     Discontinued Meds:   There are no discontinued medications.  Patient Active Problem List   Diagnosis Date Noted  . Iron deficiency anemia   . Candida esophagitis ([place])    . Generalized weakness 06/12/2016  . Symptomatic anemia 06/12/2016  . AKI (acute kidney injury) ([place]) 06/12/2016  . Mild dehydration 06/12/2016  . Thrombocytosis ([place]) 06/12/2016  . Hypothyroidism 06/12/2016  . Essential hypertension 06/12/2016  . Hyperlipidemia 06/12/2016    LABS    Component Value Date/Time   NA 138 09/05/2016 0950   NA 138 08/14/2016 1119   NA 137 07/26/2016 1218   K 3.8 09/05/2016 0950   K 3.7 08/14/2016 1119   K 4.2 07/26/2016 1218   CL 104 09/05/2016 0950   CL 103 08/14/2016 1119   CL 102 07/26/2016 1218   CO2 27 09/05/2016 0950   CO2 27 08/14/2016 1119   CO2 28 07/26/2016 1218   GLUCOSE 98 09/05/2016 0950   GLUCOSE 94 08/14/2016 1119   GLUCOSE 91 07/26/2016 1218   BUN 23 (H) 09/05/2016 0950   BUN 21 (H) 08/14/2016 1119   BUN 26 (H) 07/26/2016 1218   CREATININE 1.21 (H) 09/05/2016 0950   CREATININE 1.19 (H) 08/14/2016 1119   CREATININE 1.22 (H) 07/26/2016 1218   CALCIUM 9.0 09/05/2016 0950   CALCIUM 9.2 08/14/2016 1119   CALCIUM 9.2 07/26/2016 1218   GFRNONAA 38 (L) 09/05/2016 0950  GFRNONAA 39 (L) 08/14/2016 1119   GFRNONAA 38 (L) 07/26/2016 1218   GFRAA 44 (L) 09/05/2016 0950   GFRAA 45 (L) 08/14/2016 1119   GFRAA 44 (L) 07/26/2016 1218   CMP     Component Value Date/Time   NA 138 09/05/2016 0950   K 3.8 09/05/2016 0950   CL 104 09/05/2016 0950   CO2 27 09/05/2016 0950   GLUCOSE 98 09/05/2016 0950   BUN 23 (H) 09/05/2016 0950   CREATININE 1.21 (H) 09/05/2016 0950   CALCIUM 9.0 09/05/2016 0950   PROT 6.2 (L) 06/12/2016 1111   ALBUMIN 3.4 (L) 06/12/2016 1111   AST 18 06/12/2016 1111   ALT 13 (L) 06/12/2016 1111   ALKPHOS 56 06/12/2016 1111   BILITOT 0.4 06/12/2016 1111   GFRNONAA 38 (L) 09/05/2016 0950   GFRAA 44 (L) 09/05/2016 0950       Component Value Date/Time   WBC 7.1 09/05/2016 0950   WBC 6.9 08/14/2016 1119   WBC 6.1 07/26/2016 1218   HGB 12.8 09/05/2016 0950   HGB 13.1 08/14/2016 1119   HGB 12.1 07/26/2016 1218    HCT 38.6 09/05/2016 0950   HCT 37.5 08/14/2016 1119   HCT 36.4 07/26/2016 1218   MCV 95.1 09/05/2016 0950   MCV 91.0 08/14/2016 1119   MCV 88.3 07/26/2016 1218    Lipid Panel  No results found for: CHOL, TRIG, HDL, CHOLHDL, VLDL, LDLCALC, LDLDIRECT  ABG No results found for: PHART, PCO2ART, PO2ART, HCO3, TCO2, ACIDBASEDEF, O2SAT   Lab Results  Component Value Date   TSH 3.339 06/12/2016   BNP (last 3 results) No results for input(s): BNP in the last 8760 hours.  ProBNP (last 3 results) No results for input(s): PROBNP in the last 8760 hours.  Cardiac Panel (last 3 results) No results for input(s): CKTOTAL, CKMB, TROPONINI, RELINDX in the last 72 hours.  Iron/TIBC/Ferritin/ %Sat    Component Value Date/Time   IRON 45 09/05/2016 0950   TIBC 286 09/05/2016 0950   FERRITIN 68 09/05/2016 0950   IRONPCTSAT 16 09/05/2016 0950     EKG Orders placed or performed during the hospital encounter of 06/12/16  . ED EKG  . ED EKG  . EKG 12-Lead  . EKG 12-Lead     Prior Assessment and Plan Problem List as of 09/19/2016 Reviewed: 09/05/2016  5:12 PM by Robynn Pane, PA-C     Cardiovascular and Mediastinum   Essential hypertension     Digestive   Candida esophagitis ([place])     Endocrine   Hypothyroidism     Genitourinary   AKI (acute kidney injury) ([place])     Hematopoietic and Hemostatic   Thrombocytosis ([place])     Other   Hyperlipidemia   Generalized weakness   Symptomatic anemia   Mild dehydration   Iron deficiency anemia   Last Assessment & Plan 09/05/2016 Office Visit Edited 09/05/2016  5:12 PM by Baird Cancer, PA-C    Iron deficiency anemia with EDG/Colonoscopy in Jan 2018 demonstrating esophageal candidiasis and gastric erosions.  IV iron therapy was required given her deficit.  Labs today: CBC diff, BMET, iron/TIBC, ferritin.  I personally reviewed and went over laboratory results with the patient.  The results are noted within this dictation.  Anemia has been  corrected.  Thrombocytosis is resolved.  Iron studies demonstrate a ferritin of 68 and normal iron sat, TIBC, and serum iron.  Labs from 08/14/2016 are reviewed as well demonstrating resolution of iron deficiency.  I have recommended PO  iron therapy to prevent the need for IV iron therapy in the future (or decrease the frequency of).  Rx is printed for ferrex forte.  I have reviewed the risks, benefits, alternatives, and side effects of oral iron including, but not limited to, rash, constipation, diarrhea, abdominal pain, nausea, vomiting, darkening of stool.   Labs in 3 months: CBC diff, BMET, iron/TIBC, ferritin.  Problem list reviewed with patient and edited accordingly.  Medications are reviewed with the patient and edited accordingly.  Return in 3 months for follow-up.   More than 50% of the time spent with the patient was utilized for counseling and coordination of care.          Imaging: No results found.

## 2016-09-19 NOTE — Progress Notes (Signed)
Cardiology Office Note   Date:  09/19/2016   ID:  Katelyn Daniel, DOB [birth year], MRN [id number]  PCP:  [name], MD  Cardiologist: Lamar Sprinkles, NP   Chief Complaint  Patient presents with  . Shortness of Breath      History of Present Illness: Katelyn Daniel is a 83 y.o. female who presents for ongoing assessment and management of aortic valve disease, hypertension, hyperlipidemia, with other history to include iron deficiency anemia and thyroid disease. The patient was last seen in the office on 09/05/2016 by Dr. [name]. Aortic valve disease was not found to be clinically significant, she was started on HCTZ 12.5 mg daily with a follow-up BMET and BNP in 2 weeks.  09/06/2006 labs: Sodium 138 potassium 3.8 chloride 104 CO2 27 glucose 98 BUN 23 creatinine 1.21. Anemia profile revealed iron level 45; UIBC 241; TIBC 286; BNP was not completed.  She comes today with complaints of weakness, fatigue, and continued lung congestion with coughing. Her son states that she is not taking Symbicort as directed. She is having worsening deconditioning and often uses wheel chair for ambulation.    Echocardiogram 08/22/2016 Left ventricle: The cavity size was normal. Wall thickness was   increased in a pattern of mild LVH. Systolic function was mildly   reduced. The estimated ejection fraction was in the range of 45%   to 50%. Diffuse hypokinesis. There is moderate hypokinesis of the   basal-midinferolateral myocardium. Doppler parameters are   consistent with abnormal left ventricular relaxation (grade 1   diastolic dysfunction). - Aortic valve: Mildly calcified annulus. Trileaflet; mildly   calcified leaflets. There was mild regurgitation. Mean gradient   (S): 5 mm Hg. - Mitral valve: Calcified annulus. There was mild regurgitation. - Left atrium: The atrium was mildly dilated. - Right atrium: The atrium was at the upper limits of normal in   size. - Atrial septum: No  defect or patent foramen ovale was identified. - Tricuspid valve: There was mild regurgitation. - Pulmonary arteries: PA peak pressure: 34 mm Hg (S). - Pericardium, extracardiac: A trivial pericardial effusion was   identified posterior to the heart.  Past Medical History:  Diagnosis Date  . Hyperlipemia   . Hypertension   . Iron deficiency anemia   . Thyroid disease     Past Surgical History:  Procedure Laterality Date  . COLONOSCOPY N/A 06/14/2016   Procedure: COLONOSCOPY;  Surgeon: [name], MD;  [facility];  Service: Endoscopy;  Laterality: N/A;  . ESOPHAGOGASTRODUODENOSCOPY N/A 06/14/2016   Procedure: ESOPHAGOGASTRODUODENOSCOPY (EGD);  Surgeon: [name], MD;  Location: [facility];  Service: Endoscopy;  Laterality: N/A;  WITH DILATION   . TONSILLECTOMY    . [facility] SINUS       Current Outpatient Prescriptions  Medication Sig Dispense Refill  . Calcium Citrate-Vitamin D (CALCIUM CITRATE + D PO) Take 1 tablet by mouth daily.    Marland Kitchen gabapentin (NEURONTIN) 300 MG capsule Take 300 mg by mouth at bedtime.    . hydrochlorothiazide (MICROZIDE) 12.5 MG capsule Take 1 capsule (12.5 mg total) by mouth daily. 90 capsule 3  . levothyroxine (SYNTHROID, LEVOTHROID) 50 MCG tablet Take 50 mcg by mouth daily.    . Multiple Vitamins-Minerals (PRESERVISION AREDS PO) Take 1 capsule by mouth daily.    . naproxen (NAPROSYN) 500 MG tablet     . pantoprazole (PROTONIX) 40 MG tablet Take 1 tablet (40 mg total) by mouth 2 (two) times daily before  a meal. 60 tablet 3  . Polysacchar Iron-FA-B12 (FERREX 150 FORTE) 150-1-25 MG-MG-MCG CAPS Take 1 capsule by mouth daily. 30 capsule 11  . pravastatin (PRAVACHOL) 40 MG tablet Take 40 mg by mouth daily.  3  . traZODone (DESYREL) 50 MG tablet Take 1 tablet (50 mg total) by mouth at bedtime as needed for sleep. 30 tablet 3   No current facility-administered medications for this visit.     Allergies:   Patient has no known allergies.     Social History:  The patient  reports that she has never smoked. She has never used smokeless tobacco. She reports that she does not drink alcohol or use drugs.   Family History:  The patient's family history includes Asthma in her mother; Congestive Heart Failure in her mother; Emphysema in her father; Heart Problems in her brother.    ROS: All other systems are reviewed and negative. Unless otherwise mentioned in H&P    PHYSICAL EXAM: VS:  BP 132/80   Pulse 71   Ht '5\' 7"'$  (1.702 m)   Wt 139 lb (63 kg)   SpO2 97%   BMI 21.77 kg/m  , BMI Body mass index is 21.77 kg/m. GEN: Well nourished, well developed, in no acute distress  HEENT: normal  Neck: no JVD, carotid bruits, or masses Cardiac: RRR; 1/6 systolic murmur, rubs, or gallops,no edema  Respiratory: Bilateral rales and crackles, especially in the bases. No wheezes.  GI: soft, nontender, nondistended, + BS MS: no deformity or atrophy  Skin: warm and dry, no rash Neuro:  Strength and sensation are intact Psych: euthymic mood, full affect  Recent Labs: 06/12/2016: ALT 13; TSH 3.339 06/14/2016: Magnesium 2.1 09/05/2016: BUN 23; Creatinine, Ser 1.21; Hemoglobin 12.8; Platelets 313; Potassium 3.8; Sodium 138    Lipid Panel No results found for: CHOL, TRIG, HDL, CHOLHDL, VLDL, LDLCALC, LDLDIRECT    Wt Readings from Last 3 Encounters:  09/19/16 139 lb (63 kg)  09/05/16 141 lb (64 kg)  09/05/16 140 lb (63.5 kg)      Other studies Reviewed: Echocardiogram 12-Sep-2016 Left ventricle: The cavity size was normal. Wall thickness was   increased in a pattern of mild LVH. Systolic function was mildly   reduced. The estimated ejection fraction was in the range of 45%   to 50%. Diffuse hypokinesis. There is moderate hypokinesis of the   basal-midinferolateral myocardium. Doppler parameters are   consistent with abnormal left ventricular relaxation (grade 1   diastolic dysfunction). - Aortic valve: Mildly calcified annulus.  Trileaflet; mildly   calcified leaflets. There was mild regurgitation. Mean gradient   (S): 5 mm Hg. - Mitral valve: Calcified annulus. There was mild regurgitation. - Left atrium: The atrium was mildly dilated. - Right atrium: The atrium was at the upper limits of normal in   size. - Atrial septum: No defect or patent foramen ovale was identified. - Tricuspid valve: There was mild regurgitation. - Pulmonary arteries: PA peak pressure: 34 mm Hg (S). - Pericardium, extracardiac: A trivial pericardial effusion was   identified posterior to the heart.  ASSESSMENT AND PLAN:  1. Dyspnea: Significant lung congestion on exam. Will have CXR completed today. She will restart Symbicort. She will need to have follow up appointment with PCP, and possible referral to pulmonology for management. Will not do any more intensive testing such as CT chest at this time, deferring to PCP for further work up.  2. Aortic Valve Disease: Not significant enough to be causing her symptoms of  dyspnea per Dr. Kyla Balzarine note. Echo has been reviewed.  No further treatment at this time. HR is well controlled.   3. Anemia: Review of last H/H shows improvement in status. Defer to hematology for management.    Current medicines are reviewed at length with the patient today.    Labs/ tests ordered today include:   Orders Placed This Encounter  Procedures  . DG Chest 2 View     Disposition:   FU with cardiology in 3 months   Signed, Jory Sims, NP  09/19/2016 4:45 PM    [facility] [address] [place], [place] [address] Phone: [phone]; Fax: [phone]

## 2016-09-21 ENCOUNTER — Telehealth: Payer: Self-pay | Admitting: *Deleted

## 2016-09-21 MED ORDER — FUROSEMIDE 20 MG PO TABS: 20.0000 mg | ORAL_TABLET | Freq: Every day | ORAL | 3 refills | Status: DC

## 2016-09-21 MED ORDER — POTASSIUM CHLORIDE CRYS ER 20 MEQ PO TBCR: 20.0000 meq | EXTENDED_RELEASE_TABLET | Freq: Every day | ORAL | 3 refills | Status: DC

## 2016-09-21 NOTE — Telephone Encounter (Signed)
-----   Message from Lendon Colonel, NP sent at 09/21/2016  6:29 AM EDT ----- Please call her to make sure she is going to see PCP this week. She may have pneumonia. Please start her on lasix, 20 mg daily with potassium 20 mEq daily. She will need close follow up by PCP.

## 2016-09-21 NOTE — Telephone Encounter (Signed)
Orders placed.

## 2016-09-23 ENCOUNTER — Other Ambulatory Visit: Payer: Self-pay

## 2016-09-23 ENCOUNTER — Inpatient Hospital Stay (HOSPITAL_COMMUNITY): Payer: Medicare Other

## 2016-09-23 ENCOUNTER — Encounter (HOSPITAL_COMMUNITY): Payer: Self-pay | Admitting: Emergency Medicine

## 2016-09-23 ENCOUNTER — Inpatient Hospital Stay (HOSPITAL_COMMUNITY)
Admission: EM | Admit: 2016-09-23 | Discharge: 2016-10-03 | DRG: 177 | Disposition: A | Discharge status: Home / Self Care | Payer: Medicare Other | Attending: Family Medicine | Admitting: Family Medicine

## 2016-09-23 ENCOUNTER — Emergency Department (HOSPITAL_COMMUNITY): Payer: Medicare Other

## 2016-09-23 DIAGNOSIS — R0602 Shortness of breath: Secondary | ICD-10-CM | POA: Diagnosis present

## 2016-09-23 DIAGNOSIS — I5021 Acute systolic (congestive) heart failure: Secondary | ICD-10-CM

## 2016-09-23 DIAGNOSIS — E7800 Pure hypercholesterolemia, unspecified: Secondary | ICD-10-CM | POA: Diagnosis not present

## 2016-09-23 DIAGNOSIS — I4510 Unspecified right bundle-branch block: Secondary | ICD-10-CM | POA: Diagnosis present

## 2016-09-23 DIAGNOSIS — I428 Other cardiomyopathies: Secondary | ICD-10-CM | POA: Diagnosis not present

## 2016-09-23 DIAGNOSIS — E785 Hyperlipidemia, unspecified: Secondary | ICD-10-CM | POA: Diagnosis present

## 2016-09-23 DIAGNOSIS — Z79899 Other long term (current) drug therapy: Secondary | ICD-10-CM | POA: Diagnosis not present

## 2016-09-23 DIAGNOSIS — I509 Heart failure, unspecified: Secondary | ICD-10-CM

## 2016-09-23 DIAGNOSIS — J15212 Pneumonia due to Methicillin resistant Staphylococcus aureus: Secondary | ICD-10-CM | POA: Diagnosis not present

## 2016-09-23 DIAGNOSIS — Z8249 Family history of ischemic heart disease and other diseases of the circulatory system: Secondary | ICD-10-CM | POA: Diagnosis not present

## 2016-09-23 DIAGNOSIS — E030 Congenital hypothyroidism with diffuse goiter: Secondary | ICD-10-CM

## 2016-09-23 DIAGNOSIS — I110 Hypertensive heart disease with heart failure: Secondary | ICD-10-CM | POA: Diagnosis present

## 2016-09-23 DIAGNOSIS — I452 Bifascicular block: Secondary | ICD-10-CM | POA: Diagnosis present

## 2016-09-23 DIAGNOSIS — I429 Cardiomyopathy, unspecified: Secondary | ICD-10-CM | POA: Diagnosis present

## 2016-09-23 DIAGNOSIS — I351 Nonrheumatic aortic (valve) insufficiency: Secondary | ICD-10-CM | POA: Diagnosis present

## 2016-09-23 DIAGNOSIS — E871 Hypo-osmolality and hyponatremia: Secondary | ICD-10-CM | POA: Diagnosis not present

## 2016-09-23 DIAGNOSIS — J690 Pneumonitis due to inhalation of food and vomit: Secondary | ICD-10-CM | POA: Diagnosis not present

## 2016-09-23 DIAGNOSIS — I10 Essential (primary) hypertension: Secondary | ICD-10-CM | POA: Diagnosis not present

## 2016-09-23 DIAGNOSIS — E039 Hypothyroidism, unspecified: Secondary | ICD-10-CM | POA: Diagnosis present

## 2016-09-23 DIAGNOSIS — F419 Anxiety disorder, unspecified: Secondary | ICD-10-CM | POA: Diagnosis not present

## 2016-09-23 DIAGNOSIS — E860 Dehydration: Secondary | ICD-10-CM

## 2016-09-23 DIAGNOSIS — R531 Weakness: Secondary | ICD-10-CM

## 2016-09-23 DIAGNOSIS — I5043 Acute on chronic combined systolic (congestive) and diastolic (congestive) heart failure: Secondary | ICD-10-CM | POA: Diagnosis present

## 2016-09-23 DIAGNOSIS — E876 Hypokalemia: Secondary | ICD-10-CM | POA: Diagnosis present

## 2016-09-23 DIAGNOSIS — J181 Lobar pneumonia, unspecified organism: Secondary | ICD-10-CM

## 2016-09-23 DIAGNOSIS — D500 Iron deficiency anemia secondary to blood loss (chronic): Secondary | ICD-10-CM

## 2016-09-23 DIAGNOSIS — D509 Iron deficiency anemia, unspecified: Secondary | ICD-10-CM | POA: Diagnosis present

## 2016-09-23 DIAGNOSIS — E7801 Familial hypercholesterolemia: Secondary | ICD-10-CM

## 2016-09-23 DIAGNOSIS — J189 Pneumonia, unspecified organism: Secondary | ICD-10-CM

## 2016-09-23 LAB — BRAIN NATRIURETIC PEPTIDE: B NATRIURETIC PEPTIDE 5: 1211 pg/mL — AB (ref 0.0–100.0)

## 2016-09-23 LAB — BASIC METABOLIC PANEL
ANION GAP: 12 (ref 5–15)
BUN: 31 mg/dL — ABNORMAL HIGH (ref 6–20)
CHLORIDE: 97 mmol/L — AB (ref 101–111)
CO2: 23 mmol/L (ref 22–32)
Calcium: 8.9 mg/dL (ref 8.9–10.3)
Creatinine, Ser: 1.2 mg/dL — ABNORMAL HIGH (ref 0.44–1.00)
GFR, EST AFRICAN AMERICAN: 44 mL/min — AB (ref >60)
GFR, EST NON AFRICAN AMERICAN: 38 mL/min — AB (ref >60)
Glucose, Bld: 116 mg/dL — ABNORMAL HIGH (ref 65–99)
POTASSIUM: 4 mmol/L (ref 3.5–5.1)
SODIUM: 132 mmol/L — AB (ref 135–145)

## 2016-09-23 LAB — CBC WITH DIFFERENTIAL/PLATELET
BASOS ABS: 0 10*3/uL (ref 0.0–0.1)
Basophils Relative: 0 %
Eosinophils Absolute: 0 10*3/uL (ref 0.0–0.7)
Eosinophils Relative: 0 %
HCT: 39 % (ref 36.0–46.0)
HEMOGLOBIN: 13.9 g/dL (ref 12.0–15.0)
LYMPHS ABS: 2 10*3/uL (ref 0.7–4.0)
LYMPHS PCT: 19 %
MCH: 32.2 pg (ref 26.0–34.0)
MCHC: 35.6 g/dL (ref 30.0–36.0)
MCV: 90.3 fL (ref 78.0–100.0)
Monocytes Absolute: 1.2 10*3/uL — ABNORMAL HIGH (ref 0.1–1.0)
Monocytes Relative: 12 %
NEUTROS PCT: 69 %
Neutro Abs: 7.1 10*3/uL (ref 1.7–7.7)
Platelets: 402 10*3/uL — ABNORMAL HIGH (ref 150–400)
RBC: 4.32 MIL/uL (ref 3.87–5.11)
RDW: 15 % (ref 11.5–15.5)
WBC: 10.4 10*3/uL (ref 4.0–10.5)

## 2016-09-23 LAB — TROPONIN I: Troponin I: 0.08 ng/mL (ref <0.03)

## 2016-09-23 MED ORDER — SODIUM CHLORIDE 0.9 % IV SOLN
3.0000 g | Freq: Two times a day (BID) | INTRAVENOUS | Status: DC
  Administered 2016-09-23 – 2016-09-25 (×4): 3 g via INTRAVENOUS
  Filled 2016-09-23 (×6): qty 3

## 2016-09-23 MED ORDER — PRAVASTATIN SODIUM 40 MG PO TABS
40.0000 mg | ORAL_TABLET | Freq: Every day | ORAL | Status: DC
  Administered 2016-09-23 – 2016-10-03 (×11): 40 mg via ORAL
  Filled 2016-09-23 (×11): qty 1

## 2016-09-23 MED ORDER — GABAPENTIN 300 MG PO CAPS
300.0000 mg | ORAL_CAPSULE | Freq: Every day | ORAL | Status: DC
  Administered 2016-09-23 – 2016-10-02 (×10): 300 mg via ORAL
  Filled 2016-09-23 (×10): qty 1

## 2016-09-23 MED ORDER — POTASSIUM CHLORIDE CRYS ER 20 MEQ PO TBCR
20.0000 meq | EXTENDED_RELEASE_TABLET | Freq: Every day | ORAL | Status: DC
  Administered 2016-09-23 – 2016-09-24 (×2): 20 meq via ORAL
  Filled 2016-09-23 (×2): qty 1

## 2016-09-23 MED ORDER — FUROSEMIDE 10 MG/ML IJ SOLN
40.0000 mg | Freq: Once | INTRAMUSCULAR | Status: AC
  Administered 2016-09-23: 40 mg via INTRAVENOUS
  Filled 2016-09-23: qty 4

## 2016-09-23 MED ORDER — LEVOFLOXACIN IN D5W 500 MG/100ML IV SOLN: 500.0000 mg | INTRAVENOUS | Status: DC

## 2016-09-23 MED ORDER — PANTOPRAZOLE SODIUM 40 MG PO TBEC
40.0000 mg | DELAYED_RELEASE_TABLET | Freq: Two times a day (BID) | ORAL | Status: DC
  Administered 2016-09-23 – 2016-10-03 (×20): 40 mg via ORAL
  Filled 2016-09-23 (×20): qty 1

## 2016-09-23 MED ORDER — ENOXAPARIN SODIUM 30 MG/0.3ML ~~LOC~~ SOLN
30.0000 mg | SUBCUTANEOUS | Status: DC
  Administered 2016-09-23 – 2016-09-28 (×6): 30 mg via SUBCUTANEOUS
  Filled 2016-09-23 (×6): qty 0.3

## 2016-09-23 MED ORDER — NITROGLYCERIN 2 % TD OINT: 0.5000 [in_us] | TOPICAL_OINTMENT | Freq: Once | TRANSDERMAL | Status: DC

## 2016-09-23 MED ORDER — ACETAMINOPHEN 325 MG PO TABS: 650.0000 mg | ORAL_TABLET | ORAL | Status: DC | PRN

## 2016-09-23 MED ORDER — LEVOTHYROXINE SODIUM 50 MCG PO TABS
50.0000 ug | ORAL_TABLET | Freq: Every day | ORAL | Status: DC
  Administered 2016-09-23 – 2016-10-03 (×11): 50 ug via ORAL
  Filled 2016-09-23 (×11): qty 1

## 2016-09-23 MED ORDER — SODIUM CHLORIDE 0.9 % IV SOLN
3.0000 g | Freq: Once | INTRAVENOUS | Status: AC
  Administered 2016-09-23: 3 g via INTRAVENOUS
  Filled 2016-09-23: qty 3

## 2016-09-23 MED ORDER — ONDANSETRON HCL 4 MG/2ML IJ SOLN: 4.0000 mg | Freq: Four times a day (QID) | INTRAMUSCULAR | Status: DC | PRN

## 2016-09-23 MED ORDER — ALBUTEROL SULFATE (2.5 MG/3ML) 0.083% IN NEBU
5.0000 mg | INHALATION_SOLUTION | Freq: Once | RESPIRATORY_TRACT | Status: AC
  Administered 2016-09-23: 5 mg via RESPIRATORY_TRACT
  Filled 2016-09-23: qty 6

## 2016-09-23 MED ORDER — SODIUM CHLORIDE 0.9% FLUSH
3.0000 mL | Freq: Two times a day (BID) | INTRAVENOUS | Status: DC
  Administered 2016-09-23 – 2016-10-03 (×18): 3 mL via INTRAVENOUS

## 2016-09-23 MED ORDER — FUROSEMIDE 10 MG/ML IJ SOLN
20.0000 mg | Freq: Two times a day (BID) | INTRAMUSCULAR | Status: DC
  Administered 2016-09-23 – 2016-09-26 (×6): 20 mg via INTRAVENOUS
  Filled 2016-09-23 (×6): qty 2

## 2016-09-23 MED ORDER — POLYSACCHARIDE IRON COMPLEX 150 MG PO CAPS
150.0000 mg | ORAL_CAPSULE | Freq: Every day | ORAL | Status: DC
  Administered 2016-09-23 – 2016-10-03 (×11): 150 mg via ORAL
  Filled 2016-09-23 (×11): qty 1

## 2016-09-23 MED ORDER — SODIUM CHLORIDE 0.9% FLUSH: 3.0000 mL | INTRAVENOUS | Status: DC | PRN

## 2016-09-23 MED ORDER — TRAZODONE HCL 50 MG PO TABS
50.0000 mg | ORAL_TABLET | Freq: Every evening | ORAL | Status: DC | PRN
  Administered 2016-09-23 – 2016-10-02 (×10): 50 mg via ORAL
  Filled 2016-09-23 (×10): qty 1

## 2016-09-23 MED ORDER — SODIUM CHLORIDE 0.9 % IV SOLN: 250.0000 mL | INTRAVENOUS | Status: DC | PRN

## 2016-09-23 NOTE — ED Provider Notes (Signed)
[facility] DEPT Provider Note   CSN: [id number] Arrival date & time: 09/23/16  0608     History   Chief Complaint Chief Complaint  Patient presents with  . Shortness of Breath    HPI Katelyn Daniel is a 83 y.o. female.  The history is provided by the patient and a relative.  Shortness of Breath  This is a new problem. The average episode lasts 2 weeks. The problem occurs frequently.The current episode started more than 1 week ago. The problem has been gradually worsening. Associated symptoms include cough and orthopnea. Pertinent negatives include no fever, no sputum production, no hemoptysis, no chest pain, no syncope, no abdominal pain and no leg swelling. She has tried beta-agonist inhalers for the symptoms. The treatment provided no relief. Associated medical issues do not include asthma or COPD.  Patient with h/o hyperlipidema, HTN presents with shortness of breath for past two weeks that is worsening No fever/vomiting No CP  She has been seen by urgent care (received [facility]) PCP as well as cardiology She has been given albuterol and steroids previously Cardiology has given her lasix She reports orthopnea Tonight, family reports her dyspnea has worsened   Past Medical History:  Diagnosis Date  . Hyperlipemia   . Hypertension   . Iron deficiency anemia   . Thyroid disease     Patient Active Problem List   Diagnosis Date Noted  . Iron deficiency anemia   . Candida esophagitis ([place])   . Generalized weakness 06/12/2016  . Symptomatic anemia 06/12/2016  . AKI (acute kidney injury) ([place]) 06/12/2016  . Mild dehydration 06/12/2016  . Thrombocytosis ([place]) 06/12/2016  . Hypothyroidism 06/12/2016  . Essential hypertension 06/12/2016  . Hyperlipidemia 06/12/2016    Past Surgical History:  Procedure Laterality Date  . COLONOSCOPY N/A 06/14/2016   Procedure: COLONOSCOPY;  Surgeon: [name], MD;  [facility];  Service: Endoscopy;  Laterality: N/A;    . ESOPHAGOGASTRODUODENOSCOPY N/A 06/14/2016   Procedure: ESOPHAGOGASTRODUODENOSCOPY (EGD);  Surgeon: [name], MD;  [facility];  Service: Endoscopy;  Laterality: N/A;  WITH DILATION   . TONSILLECTOMY    . [place] SINUS      OB History    Gravida Para Term Preterm AB Living             2   SAB TAB Ectopic Multiple Live Births                   Home Medications    Prior to Admission medications   Medication Sig Start Date End Date Taking? Authorizing Provider  Calcium Citrate-Vitamin D (CALCIUM CITRATE + D PO) Take 1 tablet by mouth daily.    Historical Provider, MD  furosemide (LASIX) 20 MG tablet Take 1 tablet (20 mg total) by mouth daily. 09/21/16 12/20/16  Lendon Colonel, NP  gabapentin (NEURONTIN) 300 MG capsule Take 300 mg by mouth at bedtime. 06/06/16   Historical Provider, MD  hydrochlorothiazide (MICROZIDE) 12.5 MG capsule Take 1 capsule (12.5 mg total) by mouth daily. 09/05/16 12/04/16  Josue Hector, MD  levothyroxine (SYNTHROID, LEVOTHROID) 50 MCG tablet Take 50 mcg by mouth daily. 06/02/16   Historical Provider, MD  Multiple Vitamins-Minerals (PRESERVISION AREDS PO) Take 1 capsule by mouth daily.    Historical Provider, MD  naproxen (NAPROSYN) 500 MG tablet  07/24/16   Historical Provider, MD  pantoprazole (PROTONIX) 40 MG tablet Take 1 tablet (40 mg total) by mouth 2 (two) times daily before  a meal. 06/15/16   Lucia Gaskins, MD  Polysacchar Iron-FA-B12 [facility] 150 FORTE) 150-1-25 MG-MG-MCG CAPS Take 1 capsule by mouth daily. 09/05/16   Baird Cancer, PA-C  potassium chloride SA (K-DUR,KLOR-CON) 20 MEQ tablet Take 1 tablet (20 mEq total) by mouth daily. 09/21/16   Lendon Colonel, NP  pravastatin (PRAVACHOL) 40 MG tablet Take 40 mg by mouth daily. 03/16/16   Historical Provider, MD  traZODone (DESYREL) 50 MG tablet Take 1 tablet (50 mg total) by mouth at bedtime as needed for sleep. 06/15/16   Lucia Gaskins, MD    Family History Family History   Problem Relation Age of Onset  . Asthma Mother   . Congestive Heart Failure Mother   . Emphysema Father   . Heart Problems Brother   . Colon cancer Neg Hx     Social History Social History  Substance Use Topics  . Smoking status: Never Smoker  . Smokeless tobacco: Never Used  . Alcohol use No     Allergies   Patient has no known allergies.   Review of Systems Review of Systems  Constitutional: Negative for fever.  Respiratory: Positive for cough and shortness of breath. Negative for hemoptysis and sputum production.   Cardiovascular: Positive for orthopnea. Negative for chest pain, leg swelling and syncope.  Gastrointestinal: Negative for abdominal pain.  All other systems reviewed and are negative.    Physical Exam Updated Vital Signs BP 138/89   Pulse (!) 101   Temp 98.2 F (36.8 C) (Oral)   Resp (!) 27   SpO2 95%   Physical Exam CONSTITUTIONAL: Elderly, no acute distress HEAD: Normocephalic/atraumatic EYES: EOMI/PERRL ENMT: Mucous membranes moist NECK: supple no meningeal signs, +JVD SPINE/BACK:entire spine nontender CV: S1/S2 noted, no murmurs/rubs/gallops noted LUNGS: tachypnea, wheezing bilaterally, coarse BS noted bilaterally ABDOMEN: soft, nontender, no rebound or guarding, bowel sounds noted throughout abdomen GU:no cva tenderness NEURO: Pt is awake/alert/appropriate, moves all extremitiesx4.  No facial droop.   EXTREMITIES: pulses normal/equal, full ROM, pitting edema noted in LE SKIN: warm, color normal PSYCH: mildly anxious  ED Treatments / Results  Labs (all labs ordered are listed, but only abnormal results are displayed) Labs Reviewed  BASIC METABOLIC PANEL - Abnormal; Notable for the following:       Result Value   Sodium 132 (*)    Chloride 97 (*)    Glucose, Bld 116 (*)    BUN 31 (*)    Creatinine, Ser 1.20 (*)    GFR calc non Af Amer 38 (*)    GFR calc Af Amer 44 (*)    All other components within normal limits  CBC WITH  DIFFERENTIAL/PLATELET - Abnormal; Notable for the following:    Platelets 402 (*)    Monocytes Absolute 1.2 (*)    All other components within normal limits  BRAIN NATRIURETIC PEPTIDE - Abnormal; Notable for the following:    B Natriuretic Peptide 1,211.0 (*)    All other components within normal limits  TROPONIN I - Abnormal; Notable for the following:    Troponin I 0.08 (*)    All other components within normal limits    EKG  EKG Interpretation  Date/Time:  Saturday September 23 2016 06:19:51 EDT Ventricular Rate:  105 PR Interval:    QRS Duration: 116 QT Interval:  372 QTC Calculation: 492 R Axis:   26 Text Interpretation:  Sinus tachycardia Multiple premature complexes, vent & supraven Right bundle branch block Nonspecific T abnormalities, lateral leads No previous ECGs  available Confirmed by Christy Gentles  MD, [place] ([id number]) on 09/23/2016 6:29:13 AM       Radiology Dg Chest Port 1 View  Result Date: 09/23/2016 CLINICAL DATA:  Pt states she has been having SOB for 1 week. Started getting worse 3 days ago. Was seen at urgent care Saturday and treated with Z-pak and depomedrol. EXAM: PORTABLE CHEST 1 VIEW COMPARISON:  Chest x-rays dated 09/19/2016 and 06/12/2016. Chest CT dated 05/25/2016. FINDINGS: Increasing opacity at the right lung base. Additional opacity at the left lung base is stable compared to the most recent chest x-ray of 09/19/2016 but increased compared to earlier exams. Mild cardiomegaly is stable. There is central pulmonary vascular congestion and mild bilateral interstitial prominence, presumed interstitial edema on a background of chronic interstitial lung disease. Atherosclerotic changes noted at the aortic arch. IMPRESSION: 1. Increasing opacity at the right lung base. This is suspicious for pneumonia and small parapneumonic effusion. Alternative consideration would be aspiration pneumonitis. 2. Stable opacity at the left lung base compared to recent exam of 09/19/2016,  although increased compared to earlier chest x-ray of 06/12/2016, also suspicious for pneumonia and small pleural effusion. Again, alternative consideration would be aspiration. 3. Cardiomegaly with central pulmonary vascular congestion and bilateral interstitial prominence, presumed interstitial edema, suggesting some degree of CHF/volume overload. 4. Aortic atherosclerosis. Electronically Signed   By: Franki Cabot M.D.   On: 09/23/2016 07:27    Procedures Procedures (including critical care time)  Medications Ordered in ED Medications  furosemide (LASIX) injection 40 mg (not administered)  levofloxacin (LEVAQUIN) IVPB 500 mg (not administered)  albuterol (PROVENTIL) (2.5 MG/3ML) 0.083% nebulizer solution 5 mg (5 mg Nebulization Given 09/23/16 1740)     Initial Impression / Assessment and Plan / ED Course  I have reviewed the triage vital signs and the nursing notes.  Pertinent labs & imaging results that were available during my care of the patient were reviewed by me and considered in my medical decision making (see chart for details).     7:36 AM Pt with combined pneumonia and component of CHF Will need admission Will give lasix and also start IV antibiotics Pt with crackles bilaterally, but overall improved after neb treatment 7:47 AM Signed out to dr long, admission pending at this time   Final Clinical Impressions(s) / ED Diagnoses   Final diagnoses:  Community acquired pneumonia of right lower lobe of lung ([place])  Acute systolic congestive heart failure [facility])    New Prescriptions New Prescriptions   No medications on file     Ripley Fraise, MD 09/23/16 [phone]

## 2016-09-23 NOTE — ED Notes (Signed)
Pt ambulatory to the bathroom to void, 100cc output

## 2016-09-23 NOTE — ED Triage Notes (Signed)
Pt states she has been having SOB for 1 week.  Started getting worse 3 days ago.  Was seen at urgent care Saturday and treated with Z-pak and depomedrol.

## 2016-09-23 NOTE — Progress Notes (Signed)
Pharmacy Antibiotic Note  Katelyn Daniel is a 83 y.o. female admitted on 09/23/2016 with pneumonia.  Pharmacy has been consulted for UNASYN dosing.  Plan: Unasyn 3gm IV q12hrs (renally adjusted) Monitor labs, progress, c/s  Weight: 142 lb (64.4 kg)  Temp (24hrs), Avg:98 F (36.7 C), Min:97.7 F (36.5 C), Max:98.2 F (36.8 C)   Recent Labs Lab 09/23/16 0630  WBC 10.4  CREATININE 1.20*    Estimated Creatinine Clearance: 29.7 mL/min (A) (by C-G formula based on SCr of 1.2 mg/dL (H)).    No Known Allergies  Antimicrobials this admission: Unasyn 4/21 >>   Dose adjustments this admission:  Microbiology results:  BCx: pending  UCx: pending   Sputum:    MRSA PCR:   Thank you for allowing pharmacy to be a part of this patient's care.  Hart Robinsons A 09/23/2016 12:33 PM

## 2016-09-23 NOTE — ED Notes (Addendum)
Date and time results received: 09/23/16 728   Test: Troponin  Critical Value: 0.08  Name of Provider Notified: Dr. Christy Gentles  Orders Received? Or Actions Taken?: Repeat EKG ordered and completed.

## 2016-09-23 NOTE — H&P (Signed)
History and Physical    Katelyn Daniel:[id number] DOB: 13-Apr-1924 DOA: 09/23/2016  Referring MD/NP/PA: Nanda Quinton, EDP PCP: [name], MD  Patient coming from: home  Chief Complaint: SOB  HPI: Katelyn Daniel is a 83 y.o. female with h/o HTN, HLD, hypothyroidism, here today with SOB. This began 1 week ago. She went to urgent care and was given a zpak. Was referred to cardiology earlier in the week, they sent her for a CXR and f/u with PCP; they believed that her aortic insufficiency was not severe enough o be causing her symptoms. Seen by PCP 2 days ago and given neb treatments and prednisone. She comes in today without improvement in her SOB. She states it is especially bad when laying down at nighttime. Denies CP, has no had fevers/chills. Admission has been requested.  Past Medical/Surgical History: Past Medical History:  Diagnosis Date  . Hyperlipemia   . Hypertension   . Iron deficiency anemia   . Thyroid disease     Past Surgical History:  Procedure Laterality Date  . COLONOSCOPY N/A 06/14/2016   Procedure: COLONOSCOPY;  Surgeon: [name], MD;  Location: [facility];  Service: Endoscopy;  Laterality: N/A;  . ESOPHAGOGASTRODUODENOSCOPY N/A 06/14/2016   Procedure: ESOPHAGOGASTRODUODENOSCOPY (EGD);  Surgeon: [name], MD;  Location: [facility];  Service: Endoscopy;  Laterality: N/A;  WITH DILATION   . TONSILLECTOMY    . [facility] SINUS      Social History:  reports that she has never smoked. She has never used smokeless tobacco. She reports that she does not drink alcohol or use drugs.  Allergies: No Known Allergies  Family History:  Family History  Problem Relation Age of Onset  . Asthma Mother   . Congestive Heart Failure Mother   . Emphysema Father   . Heart Problems Brother   . Colon cancer Neg Hx     Prior to Admission medications   Medication Sig Start Date End Date Taking? Authorizing Provider  Calcium Citrate-Vitamin D (CALCIUM  CITRATE + D PO) Take 1 tablet by mouth daily.   Yes Historical Provider, MD  furosemide (LASIX) 20 MG tablet Take 1 tablet (20 mg total) by mouth daily. 09/21/16 12/20/16 Yes Lendon Colonel, NP  gabapentin (NEURONTIN) 300 MG capsule Take 300 mg by mouth at bedtime. 06/06/16  Yes Historical Provider, MD  hydrochlorothiazide (MICROZIDE) 12.5 MG capsule Take 1 capsule (12.5 mg total) by mouth daily. 09/05/16 12/04/16 Yes Josue Hector, MD  levothyroxine (SYNTHROID, LEVOTHROID) 50 MCG tablet Take 50 mcg by mouth daily. 06/02/16  Yes Historical Provider, MD  Multiple Vitamins-Minerals (PRESERVISION AREDS PO) Take 1 capsule by mouth daily.   Yes Historical Provider, MD  naproxen (NAPROSYN) 500 MG tablet Take 500 mg by mouth 2 (two) times daily as needed for mild pain.  07/24/16  Yes Historical Provider, MD  pantoprazole (PROTONIX) 40 MG tablet Take 1 tablet (40 mg total) by mouth 2 (two) times daily before a meal. 06/15/16  Yes Lucia Gaskins, MD  Polysacchar Iron-FA-B12 (FERREX 150 FORTE) 150-1-25 MG-MG-MCG CAPS Take 1 capsule by mouth daily. 09/05/16  Yes Manon Hilding Kefalas, PA-C  potassium chloride SA (K-DUR,KLOR-CON) 20 MEQ tablet Take 1 tablet (20 mEq total) by mouth daily. 09/21/16  Yes Lendon Colonel, NP  pravastatin (PRAVACHOL) 40 MG tablet Take 40 mg by mouth daily. 03/16/16  Yes Historical Provider, MD  traZODone (DESYREL) 50 MG tablet Take 1 tablet (50 mg total) by mouth at bedtime as  needed for sleep. 06/15/16  Yes Lucia Gaskins, MD    Review of Systems:  Constitutional: Denies fever, chills, diaphoresis, appetite change and fatigue.  HEENT: Denies photophobia, eye pain, redness, hearing loss, ear pain, congestion, sore throat, rhinorrhea, sneezing, mouth sores, trouble swallowing, neck pain, neck stiffness and tinnitus.   Respiratory: Denies  cough, chest tightness,  and wheezing.   Cardiovascular: Denies chest pain, palpitations and leg swelling.  Gastrointestinal: Denies nausea, vomiting,  abdominal pain, diarrhea, constipation, blood in stool and abdominal distention.  Genitourinary: Denies dysuria, urgency, frequency, hematuria, flank pain and difficulty urinating.  Endocrine: Denies: hot or cold intolerance, sweats, changes in hair or nails, polyuria, polydipsia. Musculoskeletal: Denies myalgias, back pain, joint swelling, arthralgias and gait problem.  Skin: Denies pallor, rash and wound.  Neurological: Denies dizziness, seizures, syncope, weakness, light-headedness, numbness and headaches.  Hematological: Denies adenopathy. Easy bruising, personal or family bleeding history  Psychiatric/Behavioral: Denies suicidal ideation, mood changes, confusion, nervousness, sleep disturbance and agitation    Physical Exam: Vitals:   09/23/16 0642 09/23/16 0700 09/23/16 0800 09/23/16 0857  BP:  (!) 121/95 (!) 146/95 116/81  Pulse:  (!) 56 95 75  Resp:  (!) 25 (!) 30 16  Temp: 98.2 F (36.8 C)   97.7 F (36.5 C)  TempSrc: Oral   Oral  SpO2:  97% 97% 95%  Weight:    64.4 kg (142 lb)      Constitutional: NAD, calm, comfortable Eyes: PERRL, lids and conjunctivae normal ENMT: Mucous membranes are moist. Posterior pharynx clear of any exudate or lesions.Normal dentition.  Neck: normal, supple, no masses, no thyromegaly Respiratory: decreased BS to right base, bilateral crackles Cardiovascular: Regular rate and rhythm, no murmurs / rubs / gallops. No extremity edema. 2+ pedal pulses. No carotid bruits.  Abdomen: no tenderness, no masses palpated. No hepatosplenomegaly. Bowel sounds positive.  Musculoskeletal: no clubbing / cyanosis. No joint deformity upper and lower extremities. Good ROM, no contractures. Normal muscle tone.  Skin: no rashes, lesions, ulcers. No induration Neurologic: CN 2-12 grossly intact. Sensation intact, DTR normal. Strength 5/5 in all 4.  Psychiatric: Normal judgment and insight. Alert and oriented x 3. Normal mood.    Labs on Admission: I have  personally reviewed the following labs and imaging studies  CBC:  Recent Labs Lab 09/23/16 0630  WBC 10.4  NEUTROABS 7.1  HGB 13.9  HCT 39.0  MCV 90.3  PLT 270*   Basic Metabolic Panel:  Recent Labs Lab 09/23/16 0630  NA 132*  K 4.0  CL 97*  CO2 23  GLUCOSE 116*  BUN 31*  CREATININE 1.20*  CALCIUM 8.9   GFR: Estimated Creatinine Clearance: 29.7 mL/min (A) (by C-G formula based on SCr of 1.2 mg/dL (H)). Liver Function Tests: No results for input(s): AST, ALT, ALKPHOS, BILITOT, PROT, ALBUMIN in the last 168 hours. No results for input(s): LIPASE, AMYLASE in the last 168 hours. No results for input(s): AMMONIA in the last 168 hours. Coagulation Profile: No results for input(s): INR, PROTIME in the last 168 hours. Cardiac Enzymes:  Recent Labs Lab 09/23/16 0630  TROPONINI 0.08*   BNP (last 3 results) No results for input(s): PROBNP in the last 8760 hours. HbA1C: No results for input(s): HGBA1C in the last 72 hours. CBG: No results for input(s): GLUCAP in the last 168 hours. Lipid Profile: No results for input(s): CHOL, HDL, LDLCALC, TRIG, CHOLHDL, LDLDIRECT in the last 72 hours. Thyroid Function Tests: No results for input(s): TSH, T4TOTAL, FREET4, T3FREE, THYROIDAB in  the last 72 hours. Anemia Panel: No results for input(s): VITAMINB12, FOLATE, FERRITIN, TIBC, IRON, RETICCTPCT in the last 72 hours. Urine analysis:    Component Value Date/Time   COLORURINE YELLOW 06/12/2016 [place] 06/12/2016 1042   LABSPEC 1.017 06/12/2016 1042   PHURINE 6.0 06/12/2016 1042   GLUCOSEU NEGATIVE 06/12/2016 1042   HGBUR NEGATIVE 06/12/2016 1042   BILIRUBINUR NEGATIVE 06/12/2016 1042   KETONESUR NEGATIVE 06/12/2016 1042   PROTEINUR NEGATIVE 06/12/2016 1042   NITRITE NEGATIVE 06/12/2016 1042   LEUKOCYTESUR SMALL (A) 06/12/2016 1042   Sepsis Labs: '@LABRCNTIP'$ (procalcitonin:4,lacticidven:4) ) Recent Results (from the past 240 hour(s))  Culture, blood  (routine x 2) Call MD if unable to obtain prior to antibiotics being given     Status: None (Preliminary result)   Collection Time: 09/23/16 12:31 PM  Result Value Ref Range Status   Specimen Description LEFT ANTECUBITAL  Final   Special Requests Blood Culture adequate volume  Final   Culture PENDING  Incomplete   Report Status PENDING  Incomplete  Culture, blood (routine x 2) Call MD if unable to obtain prior to antibiotics being given     Status: None (Preliminary result)   Collection Time: 09/23/16 12:37 PM  Result Value Ref Range Status   Specimen Description LEFT ANTECUBITAL  Final   Special Requests Blood Culture adequate volume  Final   Culture PENDING  Incomplete   Report Status PENDING  Incomplete     Radiological Exams on Admission: Dg Chest Port 1 View  Result Date: 09/23/2016 CLINICAL DATA:  Pt states she has been having SOB for 1 week. Started getting worse 3 days ago. Was seen at urgent care Saturday and treated with Z-pak and depomedrol. EXAM: PORTABLE CHEST 1 VIEW COMPARISON:  Chest x-rays dated 09/19/2016 and 06/12/2016. Chest CT dated 05/25/2016. FINDINGS: Increasing opacity at the right lung base. Additional opacity at the left lung base is stable compared to the most recent chest x-ray of 09/19/2016 but increased compared to earlier exams. Mild cardiomegaly is stable. There is central pulmonary vascular congestion and mild bilateral interstitial prominence, presumed interstitial edema on a background of chronic interstitial lung disease. Atherosclerotic changes noted at the aortic arch. IMPRESSION: 1. Increasing opacity at the right lung base. This is suspicious for pneumonia and small parapneumonic effusion. Alternative consideration would be aspiration pneumonitis. 2. Stable opacity at the left lung base compared to recent exam of 09/19/2016, although increased compared to earlier chest x-ray of 06/12/2016, also suspicious for pneumonia and small pleural effusion. Again,  alternative consideration would be aspiration. 3. Cardiomegaly with central pulmonary vascular congestion and bilateral interstitial prominence, presumed interstitial edema, suggesting some degree of CHF/volume overload. 4. Aortic atherosclerosis. Electronically Signed   By: Franki Cabot M.D.   On: 09/23/2016 07:27    EKG: Independently reviewed. NSR with PACs and RBBB  Assessment/Plan Principal Problem:   Aspiration pneumonia (HCC) Active Problems:   Hypothyroidism   Essential hypertension   Hyperlipidemia   Iron deficiency anemia   Acute on chronic combined systolic and diastolic CHF (congestive heart failure) (HCC)    Acute on Chronic CHF -Recent ECHO with EF 27-06%, grade 1 diastolic dysfunction, mild MR and AI. -Start ASA, low dose ACE-I, if BP tolerates, consider beta blocker. -Lasix 20 mg IV BID (double home dose). -Strict Is and Os, daily weights and strive for negative fluid balance.  PNA, ?Aspiration -Start Unasyn. -Obtain cx data. -Will request ST consultation for swallow evaluation.  ??HTN -I question this diagnosis.  Not on any meds and BP WNL.  HLD -Continue statin.  Iron Deficiency Anemia -Hb 13.9, continue iron supplementation  Hypothyroidism -Check TSH, continue synthroid   Dr. [name] to assume care in am  DVT prophylaxis: lovenox  Code Status: full code  Family Communication: friends at bedside  Disposition Plan: anticipate DC home in about 48-72 hours  Consults called: cardiology  Admission status: inpatient    Time Spent: 80 minutes  Lelon Frohlich MD [facility] Pager [phone]  If 7PM-7AM, please contact night-coverage [email or web address] Password [facility]  09/23/2016, 4:56 PM

## 2016-09-23 NOTE — ED Provider Notes (Signed)
Blood pressure 138/89, pulse (!) 101, temperature 98.2 F (36.8 C), temperature source Oral, resp. rate (!) 27, SpO2 95 %.  Assuming care from Dr. Christy Gentles.  In short, Katelyn Daniel is a 83 y.o. female with a chief complaint of Shortness of Breath .  Refer to the original H&P for additional details.  The current plan of care is to consult the hospitalist for admission.  07:55 AM Discussed patient's case with hospitalist, Dr. Christy Gentles. Patient and family (if present) updated with plan. Care transferred to hospitalist service.  Discontinued Levaquin and started Unasyn with RLL infiltrate with possible aspiration.   I reviewed all nursing notes, vitals, pertinent old records, EKGs, labs, imaging (as available).  Nanda Quinton, MD     Margette Fast, MD 09/23/16 [phone]

## 2016-09-24 LAB — BASIC METABOLIC PANEL
ANION GAP: 10 (ref 5–15)
BUN: 28 mg/dL — ABNORMAL HIGH (ref 6–20)
CHLORIDE: 98 mmol/L — AB (ref 101–111)
CO2: 27 mmol/L (ref 22–32)
Calcium: 8.4 mg/dL — ABNORMAL LOW (ref 8.9–10.3)
Creatinine, Ser: 1.17 mg/dL — ABNORMAL HIGH (ref 0.44–1.00)
GFR calc Af Amer: 46 mL/min — ABNORMAL LOW (ref >60)
GFR, EST NON AFRICAN AMERICAN: 39 mL/min — AB (ref >60)
GLUCOSE: 102 mg/dL — AB (ref 65–99)
POTASSIUM: 3.3 mmol/L — AB (ref 3.5–5.1)
Sodium: 135 mmol/L (ref 135–145)

## 2016-09-24 LAB — EXPECTORATED SPUTUM ASSESSMENT W REFEX TO RESP CULTURE

## 2016-09-24 LAB — EXPECTORATED SPUTUM ASSESSMENT W GRAM STAIN, RFLX TO RESP C

## 2016-09-24 LAB — MAGNESIUM: Magnesium: 1.9 mg/dL (ref 1.7–2.4)

## 2016-09-24 MED ORDER — SALINE SPRAY 0.65 % NA SOLN
1.0000 | Freq: Four times a day (QID) | NASAL | Status: DC | PRN
  Administered 2016-09-25: 1 via NASAL
  Filled 2016-09-24: qty 44

## 2016-09-24 MED ORDER — METOPROLOL TARTRATE 5 MG/5ML IV SOLN
5.0000 mg | Freq: Once | INTRAVENOUS | Status: AC
  Administered 2016-09-24: 5 mg via INTRAVENOUS

## 2016-09-24 MED ORDER — LEVALBUTEROL HCL 0.63 MG/3ML IN NEBU
0.6300 mg | INHALATION_SOLUTION | Freq: Four times a day (QID) | RESPIRATORY_TRACT | Status: DC | PRN
  Administered 2016-09-29 – 2016-10-02 (×11): 0.63 mg via RESPIRATORY_TRACT
  Filled 2016-09-24 (×14): qty 3

## 2016-09-24 MED ORDER — RAMIPRIL 5 MG PO CAPS
5.0000 mg | ORAL_CAPSULE | Freq: Every day | ORAL | Status: DC
  Administered 2016-09-24 – 2016-09-25 (×2): 5 mg via ORAL
  Filled 2016-09-24 (×2): qty 1

## 2016-09-24 MED ORDER — POTASSIUM CHLORIDE CRYS ER 20 MEQ PO TBCR
20.0000 meq | EXTENDED_RELEASE_TABLET | Freq: Every day | ORAL | Status: AC
  Administered 2016-09-24 – 2016-09-25 (×2): 20 meq via ORAL
  Filled 2016-09-24: qty 1

## 2016-09-24 MED ORDER — METOPROLOL TARTRATE 5 MG/5ML IV SOLN
INTRAVENOUS | Status: AC
  Administered 2016-09-24: 5 mg via INTRAVENOUS
  Filled 2016-09-24: qty 5

## 2016-09-24 MED ORDER — LEVALBUTEROL HCL 0.63 MG/3ML IN NEBU
0.6300 mg | INHALATION_SOLUTION | Freq: Four times a day (QID) | RESPIRATORY_TRACT | Status: DC
  Administered 2016-09-24 – 2016-09-26 (×7): 0.63 mg via RESPIRATORY_TRACT
  Filled 2016-09-24 (×7): qty 3

## 2016-09-24 NOTE — Significant Event (Signed)
Pt with noted SVT 150-160.  Pt hypertensive 167/89.  Pt without complaints. Dr Jani Gravel notified.  MD gives verbal order for Metoprolol 5 mg IV OTO.  Will give and continue to monitor.

## 2016-09-24 NOTE — Progress Notes (Signed)
Presumed multifactorial issues occurring Simultaneously. The patient has had increasing dyspnea over the preceding month. Recent 2-D echocardiogram revealed some degree of aortic insufficiency with calcified aortic leaflets however with increasing and systolic and end-diastolic diameters and volumes. My concern was her aortic insufficiency may have been worse then displayed on this echo. And again perhaps it was an ischemic component due to her regional wall motion abnormality? She has an elevated BNP of 1200s she was given Zithromax in urgent care I reviewed her previous chest x-ray and consider possibility of mild reactive airway disease for which she was given an inhaler and prednisone 40 a day for 7 days and then 20 a day for subsequent 7 days. The patient only took 2 days worth of prednisone before presenting to Hospital. She is an extremely stoic individual who tends to minimize verbal symptoms and really came to Dr. before the last 18 months of her life. She is currently on Lasix 20 IV every 12 as well as Unasyn for possible bilateral lobar infiltrates question of aspiration also? Mild underlying Concern is her left ventricular expansion as well as depressed systolic function EF 58-52%. For this I will re-request a cardiology consultation in a.m. with a specific question whether or not her aortic insufficiency is significantly contributory to her above symptoms. The preceding 1 month? Katelyn Daniel DPO:[id number] DOB: 04/07/1924 DOA: 09/23/2016 PCP: [name], MD   Physical Exam: Blood pressure 114/68, pulse 71, temperature 98.2 F (36.8 C), temperature source Oral, resp. rate 18, weight 61.3 kg (135 lb 2.3 oz), SpO2 95 %. Neck no JVD no carotid bruits no thyromegaly lungs diminished breath sounds in the bases no rales no wheezes no rhonchi appreciable. Heart regular rhythm 1/6 aortic outflow murmur with diastolic component no heaves thrills rubs appreciable abdomen soft nontender bowel sounds  normoactive  Investigations:  Recent Results (from the past 240 hour(s))  Culture, blood (routine x 2) Call MD if unable to obtain prior to antibiotics being given     Status: None (Preliminary result)   Collection Time: 09/23/16 12:31 PM  Result Value Ref Range Status   Specimen Description LEFT ANTECUBITAL  Final   Special Requests Blood Culture adequate volume  Final   Culture NO GROWTH < 24 HOURS  Final   Report Status PENDING  Incomplete  Culture, blood (routine x 2) Call MD if unable to obtain prior to antibiotics being given     Status: None (Preliminary result)   Collection Time: 09/23/16 12:37 PM  Result Value Ref Range Status   Specimen Description LEFT ANTECUBITAL  Final   Special Requests Blood Culture adequate volume  Final   Culture NO GROWTH < 24 HOURS  Final   Report Status PENDING  Incomplete  Culture, sputum-assessment     Status: None   Collection Time: 09/24/16  8:00 AM  Result Value Ref Range Status   Specimen Description EXPECTORATED SPUTUM  Final   Special Requests NONE  Final   Sputum evaluation   Final    THIS SPECIMEN IS ACCEPTABLE FOR SPUTUM CULTURE Performed at [facility]    Report Status 09/24/2016 FINAL  Final     Basic Metabolic Panel:  Recent Labs  09/23/16 0630 09/24/16 0516  NA 132* 135  K 4.0 3.3*  CL 97* 98*  CO2 23 27  GLUCOSE 116* 102*  BUN 31* 28*  CREATININE 1.20* 1.17*  CALCIUM 8.9 8.4*   Liver Function Tests: No results for input(s): AST, ALT, ALKPHOS, BILITOT, PROT, ALBUMIN  in the last 72 hours.   CBC:  Recent Labs  09/23/16 0630  WBC 10.4  NEUTROABS 7.1  HGB 13.9  HCT 39.0  MCV 90.3  PLT 402*    Dg Chest Port 1 View  Result Date: 09/23/2016 CLINICAL DATA:  Pt states she has been having SOB for 1 week. Started getting worse 3 days ago. Was seen at urgent care Saturday and treated with Z-pak and depomedrol. EXAM: PORTABLE CHEST 1 VIEW COMPARISON:  Chest x-rays dated 09/19/2016 and 06/12/2016. Chest CT  dated 05/25/2016. FINDINGS: Increasing opacity at the right lung base. Additional opacity at the left lung base is stable compared to the most recent chest x-ray of 09/19/2016 but increased compared to earlier exams. Mild cardiomegaly is stable. There is central pulmonary vascular congestion and mild bilateral interstitial prominence, presumed interstitial edema on a background of chronic interstitial lung disease. Atherosclerotic changes noted at the aortic arch. IMPRESSION: 1. Increasing opacity at the right lung base. This is suspicious for pneumonia and small parapneumonic effusion. Alternative consideration would be aspiration pneumonitis. 2. Stable opacity at the left lung base compared to recent exam of 09/19/2016, although increased compared to earlier chest x-ray of 06/12/2016, also suspicious for pneumonia and small pleural effusion. Again, alternative consideration would be aspiration. 3. Cardiomegaly with central pulmonary vascular congestion and bilateral interstitial prominence, presumed interstitial edema, suggesting some degree of CHF/volume overload. 4. Aortic atherosclerosis. Electronically Signed   By: Franki Cabot M.D.   On: 09/23/2016 07:27      Medications:  Impression: Increased Lv ED ES diameters  Principal Problem:   Aspiration pneumonia (HCC) Active Problems:   Hypothyroidism   Essential hypertension   Hyperlipidemia   Iron deficiency anemia   Acute on chronic combined systolic and diastolic CHF (congestive heart failure) (HCC)     Plan: Continue Unasyn and diuresis as ordered. We will re-request cardiology consult 4 AM 12.9 whether or not aortic valve issues and increasing end systolic / diastolic measurements are significant in her increasing dyspnea and elevated BNP for the . Preceding month?  Consultants:    Procedures   Antibiotics: uansyn          Time spent: 40 minutes   LOS: 1 day   Angla Delahunt M   09/24/2016, 10:57 AM

## 2016-09-25 DIAGNOSIS — J690 Pneumonitis due to inhalation of food and vomit: Secondary | ICD-10-CM

## 2016-09-25 DIAGNOSIS — I10 Essential (primary) hypertension: Secondary | ICD-10-CM

## 2016-09-25 DIAGNOSIS — I5043 Acute on chronic combined systolic (congestive) and diastolic (congestive) heart failure: Secondary | ICD-10-CM

## 2016-09-25 DIAGNOSIS — I428 Other cardiomyopathies: Secondary | ICD-10-CM

## 2016-09-25 LAB — BASIC METABOLIC PANEL
Anion gap: 11 (ref 5–15)
BUN: 24 mg/dL — AB (ref 6–20)
CALCIUM: 8.2 mg/dL — AB (ref 8.9–10.3)
CO2: 28 mmol/L (ref 22–32)
Chloride: 96 mmol/L — ABNORMAL LOW (ref 101–111)
Creatinine, Ser: 1.07 mg/dL — ABNORMAL HIGH (ref 0.44–1.00)
GFR calc Af Amer: 51 mL/min — ABNORMAL LOW (ref >60)
GFR calc non Af Amer: 44 mL/min — ABNORMAL LOW (ref >60)
GLUCOSE: 95 mg/dL (ref 65–99)
Potassium: 3.5 mmol/L (ref 3.5–5.1)
Sodium: 135 mmol/L (ref 135–145)

## 2016-09-25 LAB — BRAIN NATRIURETIC PEPTIDE: B Natriuretic Peptide: 608 pg/mL — ABNORMAL HIGH (ref 0.0–100.0)

## 2016-09-25 LAB — STREP PNEUMONIAE URINARY ANTIGEN: STREP PNEUMO URINARY ANTIGEN: NEGATIVE

## 2016-09-25 MED ORDER — SODIUM CHLORIDE 0.9 % IV SOLN
3.0000 g | Freq: Three times a day (TID) | INTRAVENOUS | Status: DC
  Administered 2016-09-25 – 2016-10-01 (×18): 3 g via INTRAVENOUS
  Filled 2016-09-25 (×21): qty 3

## 2016-09-25 MED ORDER — BISOPROLOL FUMARATE 5 MG PO TABS
5.0000 mg | ORAL_TABLET | Freq: Every day | ORAL | Status: DC
  Administered 2016-09-25 – 2016-10-03 (×9): 5 mg via ORAL
  Filled 2016-09-25 (×10): qty 1

## 2016-09-25 NOTE — Progress Notes (Signed)
Pharmacy Antibiotic Note  Katelyn Daniel is a 83 y.o. female admitted on 09/23/2016 with pneumonia.  Pharmacy has been consulted for UNASYN dosing.  Plan: Increase Unasyn to 3gm IV every 8 hours. Monitor labs, micro and vitals.  Weight: 134 lb 12.8 oz (61.1 kg)  Temp (24hrs), Avg:98.3 F (36.8 C), Min:97.7 F (36.5 C), Max:98.7 F (37.1 C)   Recent Labs Lab 09/23/16 0630 09/24/16 0516 09/25/16 0432  WBC 10.4  --   --   CREATININE 1.20* 1.17* 1.07*    Estimated Creatinine Clearance: 33 mL/min (A) (by C-G formula based on SCr of 1.07 mg/dL (H)).    No Known Allergies  Antimicrobials this admission: Unasyn 4/21 >>    Dose adjustments this admission: 4/23 - improved renal function  Microbiology results: 4/22 BCx: pending 4/22 Sputum:  pending  Thank you for allowing pharmacy to be a part of this patient's care.  Pricilla Larsson 09/25/2016 12:51 PM

## 2016-09-25 NOTE — Progress Notes (Signed)
Katelyn Daniel cardiology notes and expertise. There is a question of compliance here with any and all medications which may explain her failure to improve over the preceding few weeks? She is currently diuresing very well with Lasix 20 IV every 12 hours bisoprolol 5 mg added to Altace 5 mg. We will continue to ensure her diuresis with inpatient management Katelyn Daniel JXB:[id number] DOB: Jul 01, 1923 DOA: 09/23/2016 PCP: Katelyn Curet, MD   Physical Exam: Blood pressure 109/85, pulse 91, temperature 98 F (36.7 C), temperature source Oral, resp. rate 16, weight 61.1 kg (134 lb 12.8 oz), SpO2 93 %. Lungs diminished breath sounds in the bases no rales wheeze rhonchi appreciable heart regular rhythm 1/6 aortic outflow murmurs goes heaves thrills or rubs  Investigations:  Recent Results (from the past 240 hour(s))  Culture, blood (routine x 2) Call MD if unable to obtain prior to antibiotics being given     Status: None (Preliminary result)   Collection Time: 09/23/16 12:31 PM  Result Value Ref Range Status   Specimen Description LEFT ANTECUBITAL  Final   Special Requests Blood Culture adequate volume  Final   Culture NO GROWTH 2 DAYS  Final   Report Status PENDING  Incomplete  Culture, blood (routine x 2) Call MD if unable to obtain prior to antibiotics being given     Status: None (Preliminary result)   Collection Time: 09/23/16 12:37 PM  Result Value Ref Range Status   Specimen Description LEFT ANTECUBITAL  Final   Special Requests Blood Culture adequate volume  Final   Culture NO GROWTH 2 DAYS  Final   Report Status PENDING  Incomplete  Culture, sputum-assessment     Status: None   Collection Time: 09/24/16  8:00 AM  Result Value Ref Range Status   Specimen Description EXPECTORATED SPUTUM  Final   Special Requests NONE  Final   Sputum evaluation   Final    THIS SPECIMEN IS ACCEPTABLE FOR SPUTUM CULTURE Performed at [facility]    Report Status 09/24/2016 FINAL  Final   Culture, respiratory (NON-Expectorated)     Status: None (Preliminary result)   Collection Time: 09/24/16  8:00 AM  Result Value Ref Range Status   Specimen Description EXPECTORATED SPUTUM  Final   Special Requests NONE Reflexed from [id number]  Final   Gram Stain   Final    RARE WBC PRESENT, PREDOMINANTLY PMN MODERATE GRAM POSITIVE COCCI IN CLUSTERS    Culture   Final    ABUNDANT STAPHYLOCOCCUS AUREUS SUSCEPTIBILITIES TO FOLLOW Performed at [facility], [place] [address] [place], [place] [address]    Report Status PENDING  Incomplete     Basic Metabolic Panel:  Recent Labs  09/24/16 0516 09/24/16 0529 09/25/16 0432  NA 135  --  135  K 3.3*  --  3.5  CL 98*  --  96*  CO2 27  --  28  GLUCOSE 102*  --  95  BUN 28*  --  24*  CREATININE 1.17*  --  1.07*  CALCIUM 8.4*  --  8.2*  MG  --  1.9  --    Liver Function Tests: No results for input(s): AST, ALT, ALKPHOS, BILITOT, PROT, ALBUMIN in the last 72 hours.   CBC:  Recent Labs  09/23/16 0630  WBC 10.4  NEUTROABS 7.1  HGB 13.9  HCT 39.0  MCV 90.3  PLT 402*    No results found.    Medications:   Impression: Principal Problem:   Aspiration  pneumonia [facility]) Active Problems:   Hypothyroidism   Essential hypertension   Hyperlipidemia   Iron deficiency anemia   Acute on chronic combined systolic and diastolic CHF (congestive heart failure) ([place])     Plan:  Consultants: Cardiology   Procedures   Antibiotics: Continue IV Unasyn         Time spent: 30 minutes   LOS: 2 days   Katelyn Daniel M   09/25/2016, 2:07 PM

## 2016-09-25 NOTE — Consult Note (Signed)
CARDIOLOGY CONSULT NOTE   Patient ID: Katelyn Daniel MRN: [id number] DOB/AGE: 1923/12/26 83 y.o.  Admit Date: 09/23/2016 Referring Physician: [name], MD Primary Physician: [name], MD Consulting Cardiologist: [name] MD Primary Cardiologist: [name] MD Reason for Consultation: CHF  Clinical Summary Katelyn Daniel is a 83 y.o. female who is being seen today for the evaluation of CHF at the request of Dr. [name]. She has a history of hypertension and hyperlipidemia. She was seen by Dr. [name] in the past for heart murmur with echocardiogram demonstrating only a sclerotic aortic valve. She was seen in our office on 09/19/2016, complaining of coughing and congestion,. CXR was completed which revealed pneumonia and possible pulmonary edema. She was started on lasix, asked to take Symbicort as directed and asked to follow up with PCP for treatment of pneumonia. Although she filled Rx for Lasix, she had not started taking it.  She presented to the ER with complaints of worsening of dyspnea, and PND. She was admitted for aspiration pneumonia, and CHF. On arrival to ER, BP 138/89, HR 103, afebrile, O2 sat 95%. Pertinent labs: Na 132, BUN 31, Creatinine 1.20. BNP 1,211. Troponin 0.08.  No evidence of anemia, or infection. PLTS 402. EKG revealed NSR with PAC's LAFB with incomplete RBBB. and CXR revealed increasing opacity in the left lung base suspicious for pneumonia and some degree of CHF and volume overload. She was treated with albuterol inhaler, lasix 40 mg IV and started on antibiotics.  Since admission she has diuresed 4 liters. Creatinine has improved to 1.07 from 1.20, BNP improved to 608.  Wt is down from 142 to 134 lbs. Follow-up echocardiogram from March reveals LVEF 45-50% range.  No Known Allergies  Medications Scheduled Medications: . enoxaparin (LOVENOX) injection  30 mg Subcutaneous Q24H  . furosemide  20 mg Intravenous Q12H  . gabapentin  300 mg  Oral QHS  . iron polysaccharides  150 mg Oral Daily  . levalbuterol  0.63 mg Nebulization Q6H  . levothyroxine  50 mcg Oral Daily  . pantoprazole  40 mg Oral BID AC  . potassium chloride  20 mEq Oral Daily  . pravastatin  40 mg Oral Daily  . ramipril  5 mg Oral Daily  . sodium chloride flush  3 mL Intravenous Q12H    Infusions: . sodium chloride    . ampicillin-sulbactam (UNASYN) IV 3 g (09/25/16 0811)    PRN Medications: sodium chloride, acetaminophen, levalbuterol, ondansetron (ZOFRAN) IV, sodium chloride, sodium chloride flush, traZODone   Past Medical History:  Diagnosis Date  . Hyperlipemia   . Hypertension   . Iron deficiency anemia   . Thyroid disease     Past Surgical History:  Procedure Laterality Date  . COLONOSCOPY N/A 06/14/2016   Procedure: COLONOSCOPY;  Surgeon: Katelyn Binder, MD;  Location: [facility];  Service: Endoscopy;  Laterality: N/A;  . ESOPHAGOGASTRODUODENOSCOPY N/A 06/14/2016   Procedure: ESOPHAGOGASTRODUODENOSCOPY (EGD);  Surgeon: Katelyn Binder, MD;  Location: [facility];  Service: Endoscopy;  Laterality: N/A;  WITH DILATION   . TONSILLECTOMY    . [facility] SINUS      Family History  Problem Relation Age of Onset  . Asthma Mother   . Congestive Heart Failure Mother   . Emphysema Father   . Heart Problems Brother   . Colon cancer Neg Hx      Social History Katelyn Daniel reports that she has never smoked. She has never used smokeless tobacco. Katelyn Daniel reports  that she does not drink alcohol.  Review of Systems Complete review of systems are found to be negative unless outlined in H&P above. Intermittent cough and chest congestion. States that she had leg swelling about a month ago as well.  Physical Examination Blood pressure 138/90, pulse 88, temperature 98.4 F (36.9 C), temperature source Oral, resp. rate 15, weight 134 lb 12.8 oz (61.1 kg), SpO2 92 %.  Intake/Output Summary (Last 24 hours) at 09/25/16 0857 Last data filed at  09/25/16 0451  Gross per 24 hour  Intake             1020 ml  Output             1100 ml  Net              -80 ml    Telemetry: SR, with frequent PAC;s and episodes of tachycardia.   GEN: Frail elderly woman, no acute distress  HEENT: Conjunctiva and lids normal, oropharynx clear. Neck: Supple, no elevated JVP or carotid bruits, no thyromegaly. Lungs: Bilateral rhonchi, with frequent coughing on taking a deep breath  Cardiac: RRR with ectopy, 1/6 systolic murmur, no pericardial rub. Abdomen: Soft, nontender, bowel sounds present, no guarding or rebound. Extremities: No pitting edema, distal pulses 2+. Skin: Warm and dry. Musculoskeletal: No kyphosis. Neuropsychiatric: Alert and oriented x3, affect grossly appropriate.  Prior Cardiac Testing/Procedures Echocardiogram 08/22/2016 Left ventricle: The cavity size was normal. Wall thickness was   increased in a pattern of mild LVH. Systolic function was mildly   reduced. The estimated ejection fraction was in the range of 45%   to 50%. Diffuse hypokinesis. There is moderate hypokinesis of the   basal-midinferolateral myocardium. Doppler parameters are   consistent with abnormal left ventricular relaxation (grade 1   diastolic dysfunction). - Aortic valve: Mildly calcified annulus. Trileaflet; mildly   calcified leaflets. There was mild regurgitation. Mean gradient   (S): 5 mm Hg. - Mitral valve: Calcified annulus. There was mild regurgitation. - Left atrium: The atrium was mildly dilated. - Right atrium: The atrium was at the upper limits of normal in   size. - Atrial septum: No defect or patent foramen ovale was identified. - Tricuspid valve: There was mild regurgitation. - Pulmonary arteries: PA peak pressure: 34 mm Hg (S). - Pericardium, extracardiac: A trivial pericardial effusion was  Lab Results  Basic Metabolic Panel:  Recent Labs Lab 09/23/16 0630 09/24/16 0516 09/24/16 0529 09/25/16 0432  NA 132* 135  --  135  K  4.0 3.3*  --  3.5  CL 97* 98*  --  96*  CO2 23 27  --  28  GLUCOSE 116* 102*  --  95  BUN 31* 28*  --  24*  CREATININE 1.20* 1.17*  --  1.07*  CALCIUM 8.9 8.4*  --  8.2*  MG  --   --  1.9  --     CBC:  Recent Labs Lab 09/23/16 0630  WBC 10.4  NEUTROABS 7.1  HGB 13.9  HCT 39.0  MCV 90.3  PLT 402*    Cardiac Enzymes:  Recent Labs Lab 09/23/16 0630  TROPONINI 0.08*    BNP: 608   Radiology: IMPRESSION: 1. Increasing opacity at the right lung base. This is suspicious for pneumonia and small parapneumonic effusion. Alternative consideration would be aspiration pneumonitis. 2. Stable opacity at the left lung base compared to recent exam of 09/19/2016, although increased compared to earlier chest x-ray of 06/12/2016, also suspicious for pneumonia and small  pleural effusion. Again, alternative consideration would be aspiration. 3. Cardiomegaly with central pulmonary vascular congestion and bilateral interstitial prominence, presumed interstitial edema, suggesting some degree of CHF/volume overload. 4. Aortic atherosclerosis.   ECG: SR with frequent PAC's incomplete RBBB, LAFB, HR 91 bpm.    Impression and Recommendations  1. Component of volume overload in the setting of mild left ventricular dysfunction: Initial CXR on 09/19/2016 revealed evidence of possible pulmonary edema, and was started on lasix as OP. Filled Rx but did not take it. Symptoms of dyspnea worsened with PND. Has now been diuresed 4 liters with IV lasix. Echo on 08/22/2016 revealed EF of 45%-50%. She will likely need to have oral lasix on discharge. Will replete potassium to keep level at least 4.0. May need low dose BB for HR control in the setting of frequent PAC's with evidence tachycardia during this admission.   2. Pneumonia: Possible aspiration in etiology. Speech therapy is to see. Frequent coughing continues. She is on antibiotics per PCP.   3. Sclerotic aortic valve: Echocardiogram did not  reveal stenosis, or significant regurgitation. Remains on lisinopril   4. Hypertension: Currently stable.   5. Hyperlipidemia: Continue statin.    Signed: Phill Myron. Lawrence NP [place]  09/25/2016, 8:57 AM Co-Sign MD   Attending note:  Patient seen and examined. Reviewed records and discussed the case with Ms. Lawrence NP. Ms. Rule is a patient of Dr. [name], currently admitted to the hospital with volume overload and probable pneumonia. She has mild left ventricular dysfunction by recent echocardiogram in March showing LVEF 45-50%, and has had some improvement with IV diuresis. She has been treated with antibiotics and has workup pending per speech therapy, aspiration has been suspected.   On examination this morning she states that she does feel somewhat better, not back to baseline. Systolic blood pressures in the 130s, heart rate 80s to 90s in sinus rhythm with frequent PACs by telemetry. Lungs exhibit scattered rhonchi with prolonged expiratory phase and frequent coughing on taking a deep breath. Cardiac exam reveals regular rate and rhythm with ectopy, no gallop, 2/6 systolic murmur. She has no peripheral edema. Lab work reveals creatinine 1.1, potassium 3.5, BNP 608, troponin I negative. Tracing from 09/23/2016 showed sinus rhythm with frequent PACs, incomplete right bundle branch block, left anterior fascicular block.  Component of volume overload with mild left ventricular dysfunction. LVEF 45-50% by recent echocardiogram. She is diuresing on IV Lasix with decreased weight of about 6 pounds. Also has pneumonia which is being managed by primary team. Continue IV Lasix for now in light of stable renal function. Continue Altace and Pravachol. We will add low dose bisoprolol as well with cardiomyopathy.  Satira Sark, M.D., F.A.C.C.

## 2016-09-25 NOTE — Care Management Note (Signed)
Case Management Note  Patient Details  Name: Katelyn Daniel MRN: [id number] Date of Birth: Dec 28, 1923  Subjective/Objective:                  Pt admitted with pneumonia. Pt is from home, lives with her husband who requires high level of care. They have paid caregivers to care for her husband. Pt is ind with ADL's. Pt has PCP, drives herself to appointments and has insurance with drug coverage. She has no HH or DME needs pta. No needs communicated.   Action/Plan: Anticipate return home with self care. Will follow for DC planning needs.   Expected Discharge Date:  09/25/16               Expected Discharge Plan:  Home/Self Care  In-House Referral:  NA  Discharge planning Services  CM Consult  Post Acute Care Choice:  NA Choice offered to:  NA  Status of Service:  In process, will continue to follow  Sherald Barge, RN 09/25/2016, 1:04 PM

## 2016-09-25 NOTE — Evaluation (Signed)
Clinical/Bedside Swallow Evaluation Patient Details  Name: Katelyn Daniel MRN: [id number] Date of Birth: 05-05-1924  Today's Date: 09/25/2016 Time: SLP Start Time (ACUTE ONLY): 1652 SLP Stop Time (ACUTE ONLY): 1712 SLP Time Calculation (min) (ACUTE ONLY): 20 min  Past Medical History:  Past Medical History:  Diagnosis Date  . Hyperlipemia   . Hypertension   . Iron deficiency anemia   . Thyroid disease    Past Surgical History:  Past Surgical History:  Procedure Laterality Date  . COLONOSCOPY N/A 06/14/2016   Procedure: COLONOSCOPY;  Surgeon: [name], MD;  [facility];  Service: Endoscopy;  Laterality: N/A;  . ESOPHAGOGASTRODUODENOSCOPY N/A 06/14/2016   Procedure: ESOPHAGOGASTRODUODENOSCOPY (EGD);  Surgeon: [name], MD;  Location: [facility];  Service: Endoscopy;  Laterality: N/A;  WITH DILATION   . TONSILLECTOMY    . [facility] SINUS     HPI:  Katelyn Daniel a 83 y.o.femalewith h/o HTN, HLD, hypothyroidism, here today with SOB. This began 1 week ago. She went to urgent care and was given a zpak. Was referred to cardiology earlier in the week, they sent her for a CXR and f/u with PCP; they believed that her aortic insufficiency was not severe enough o be causing her symptoms. Seen by PCP 2 days ago and given neb treatments and prednisone. She comes in today without improvement in her SOB. She states it is especially bad when laying down at nighttime. CXR 4/21 reveals " increasing opacity at the right lung base. This is suspicious for pneumonia and small parapneumonic effusion. Alternative   Assessment / Plan / Recommendation Clinical Impression  Pt was evaluated at bedside consuming thin liquids, puree textures and regular textures; she demonstrated no overt s/sx of aspiration with any textures/consistencies presented. Pt did report she still feels SOB and that it is difficult to eat and feel she is getting "enough breath". Pt was educated of the importance of  utilizing a slow consumption rate and taking breaks to breathe when she feels SOB; note increased aspiration risk d/t reported SOB possibly resulting in difficulty coordinating respiration and swallowing. Pt was also educated of the importance of sitting at 90 degrees during all PO intake. Follow standard aspiration precautions. Recommend continue with regular textures and thin liquids. There are no further ST needs at this time, ST to sign off.  SLP Visit Diagnosis: Dysphagia, oropharyngeal phase (R13.12)    Aspiration Risk  Mild aspiration risk    Diet Recommendation Regular;Thin liquid   Liquid Administration via: Cup;Straw Supervision: Patient able to self feed Compensations: Slow rate;Small sips/bites;Multiple dry swallows after each bite/sip;Clear throat intermittently    Other  Recommendations Oral Care Recommendations: Oral care BID   Follow up Recommendations None        Swallow Study   General Date of Onset: 09/23/16 HPI: Katelyn Daniel a 83 y.o.femalewith h/o HTN, HLD, hypothyroidism, here today with SOB. This began 1 week ago. She went to urgent care and was given a zpak. Was referred to cardiology earlier in the week, they sent her for a CXR and f/u with PCP; they believed that her aortic insufficiency was not severe enough o be causing her symptoms. Seen by PCP 2 days ago and given neb treatments and prednisone. She comes in today without improvement in her SOB. She states it is especially bad when laying down at nighttime. CXR 4/21 reveals " increasing opacity at the right lung base. This is suspicious for pneumonia and small parapneumonic effusion.  Alternative Type of Study: Bedside Swallow Evaluation Previous Swallow Assessment: none Diet Prior to this Study: Regular;Thin liquids Temperature Spikes Noted: No History of Recent Intubation: No Behavior/Cognition: Alert;Cooperative;Pleasant mood Oral Cavity Assessment: Within Functional Limits Volitional Cough:  Strong Volitional Swallow: Able to elicit    Oral/Motor/Sensory Function Overall Oral Motor/Sensory Function: Within functional limits   Ice Chips Ice chips: Within functional limits   Thin Liquid Thin Liquid: Within functional limits    Nectar Thick Nectar Thick Liquid: Not tested   Honey Thick Honey Thick Liquid: Not tested   Puree Puree: Within functional limits   Solid   Katelyn Daniel, CCC-SLP Speech Language Pathologist  Solid: Within functional limits        Wende Bushy 09/25/2016,5:22 PM

## 2016-09-26 ENCOUNTER — Inpatient Hospital Stay (HOSPITAL_COMMUNITY): Payer: Medicare Other

## 2016-09-26 LAB — CBC WITH DIFFERENTIAL/PLATELET
BASOS ABS: 0 10*3/uL (ref 0.0–0.1)
BASOS PCT: 0 %
Eosinophils Absolute: 0.1 10*3/uL (ref 0.0–0.7)
Eosinophils Relative: 1 %
HEMATOCRIT: 43.9 % (ref 36.0–46.0)
Hemoglobin: 14.5 g/dL (ref 12.0–15.0)
Lymphocytes Relative: 17 %
Lymphs Abs: 1.8 10*3/uL (ref 0.7–4.0)
MCH: 32 pg (ref 26.0–34.0)
MCHC: 33 g/dL (ref 30.0–36.0)
MCV: 96.9 fL (ref 78.0–100.0)
MONO ABS: 1.1 10*3/uL — AB (ref 0.1–1.0)
Monocytes Relative: 10 %
NEUTROS PCT: 72 %
Neutro Abs: 7.5 10*3/uL (ref 1.7–7.7)
Platelets: 427 10*3/uL — ABNORMAL HIGH (ref 150–400)
RBC: 4.53 MIL/uL (ref 3.87–5.11)
RDW: 15.5 % (ref 11.5–15.5)
WBC: 10.5 10*3/uL (ref 4.0–10.5)

## 2016-09-26 LAB — BASIC METABOLIC PANEL
ANION GAP: 11 (ref 5–15)
BUN: 20 mg/dL (ref 6–20)
CALCIUM: 8.5 mg/dL — AB (ref 8.9–10.3)
CO2: 30 mmol/L (ref 22–32)
Chloride: 94 mmol/L — ABNORMAL LOW (ref 101–111)
Creatinine, Ser: 1.19 mg/dL — ABNORMAL HIGH (ref 0.44–1.00)
GFR, EST AFRICAN AMERICAN: 45 mL/min — AB (ref >60)
GFR, EST NON AFRICAN AMERICAN: 39 mL/min — AB (ref >60)
GLUCOSE: 116 mg/dL — AB (ref 65–99)
Potassium: 3.1 mmol/L — ABNORMAL LOW (ref 3.5–5.1)
SODIUM: 135 mmol/L (ref 135–145)

## 2016-09-26 LAB — CULTURE, RESPIRATORY

## 2016-09-26 LAB — CULTURE, RESPIRATORY W GRAM STAIN

## 2016-09-26 MED ORDER — POTASSIUM CHLORIDE CRYS ER 20 MEQ PO TBCR
40.0000 meq | EXTENDED_RELEASE_TABLET | Freq: Two times a day (BID) | ORAL | Status: AC
  Administered 2016-09-26 (×2): 40 meq via ORAL
  Filled 2016-09-26 (×2): qty 2

## 2016-09-26 MED ORDER — POTASSIUM CHLORIDE CRYS ER 20 MEQ PO TBCR
40.0000 meq | EXTENDED_RELEASE_TABLET | Freq: Once | ORAL | Status: AC
  Administered 2016-09-27: 40 meq via ORAL
  Filled 2016-09-26: qty 2

## 2016-09-26 MED ORDER — SODIUM CHLORIDE 0.9 % IV SOLN
1250.0000 mg | Freq: Once | INTRAVENOUS | Status: AC
  Administered 2016-09-26: 1250 mg via INTRAVENOUS
  Filled 2016-09-26: qty 1250

## 2016-09-26 MED ORDER — RAMIPRIL 1.25 MG PO CAPS
2.5000 mg | ORAL_CAPSULE | Freq: Every day | ORAL | Status: DC
  Administered 2016-09-27 – 2016-10-03 (×7): 2.5 mg via ORAL
  Filled 2016-09-26 (×8): qty 2

## 2016-09-26 MED ORDER — LEVALBUTEROL HCL 0.63 MG/3ML IN NEBU
0.6300 mg | INHALATION_SOLUTION | Freq: Three times a day (TID) | RESPIRATORY_TRACT | Status: DC
  Administered 2016-09-26 – 2016-09-28 (×6): 0.63 mg via RESPIRATORY_TRACT
  Filled 2016-09-26 (×6): qty 3

## 2016-09-26 MED ORDER — VANCOMYCIN HCL IN DEXTROSE 750-5 MG/150ML-% IV SOLN
750.0000 mg | INTRAVENOUS | Status: DC
  Administered 2016-09-28 – 2016-09-30 (×3): 750 mg via INTRAVENOUS
  Filled 2016-09-26 (×6): qty 150

## 2016-09-26 MED ORDER — FUROSEMIDE 20 MG PO TABS
20.0000 mg | ORAL_TABLET | Freq: Every day | ORAL | Status: DC
  Administered 2016-09-26: 20 mg via ORAL
  Filled 2016-09-26: qty 1

## 2016-09-26 NOTE — Progress Notes (Signed)
Pharmacy Antibiotic Note  Katelyn Daniel is a 83 y.o. female admitted on 09/23/2016 with pneumonia.  Pharmacy has been consulted for UNASYN dosing. 4/24  Sputum culture with MRSA.  Will add vancomycin.  Plan: Vnacomycin 1250 mg IV X 1 then 750 mg IV q24 hours Cont Unasyn to 3gm IV every 8 hours. f/u renal function, cultures and clinical course  Weight: 136 lb 7.4 oz (61.9 kg)  Temp (24hrs), Avg:98.3 F (36.8 C), Min:98 F (36.7 C), Max:98.6 F (37 C)   Recent Labs Lab 09/23/16 0630 09/24/16 0516 09/25/16 0432 09/26/16 0607  WBC 10.4  --   --   --   CREATININE 1.20* 1.17* 1.07* 1.19*    Estimated Creatinine Clearance: 29.9 mL/min (A) (by C-G formula based on SCr of 1.19 mg/dL (H)).    No Known Allergies  Antimicrobials this admission: Unasyn 4/21 >>  Vanc  4/24>>   Dose adjustments this admission: 4/23 - improved renal function  Microbiology results: 4/22 BCx: ngtd 4/22 Sputum:  MRSA  Thank you for allowing pharmacy to be a part of this patient's care.  Excell Seltzer P[place]et 09/26/2016 11:08 AM

## 2016-09-26 NOTE — Progress Notes (Signed)
Progress Note  Patient Name: Katelyn Daniel Date of Encounter: 09/26/2016  Primary Cardiologist: [name], MD  Subjective   Does not feel breathing is much better. States she did not sleep well last night because she had trouble with breathing while supine. Worse "when I close my eyes." No chest pain.   Inpatient Medications    Scheduled Meds: . bisoprolol  5 mg Oral Daily  . enoxaparin (LOVENOX) injection  30 mg Subcutaneous Q24H  . furosemide  20 mg Intravenous Q12H  . gabapentin  300 mg Oral QHS  . iron polysaccharides  150 mg Oral Daily  . levalbuterol  0.63 mg Nebulization TID  . levothyroxine  50 mcg Oral Daily  . pantoprazole  40 mg Oral BID AC  . pravastatin  40 mg Oral Daily  . ramipril  5 mg Oral Daily  . sodium chloride flush  3 mL Intravenous Q12H   Continuous Infusions: . sodium chloride    . ampicillin-sulbactam (UNASYN) IV Stopped (09/26/16 0100)   PRN Meds: sodium chloride, acetaminophen, levalbuterol, ondansetron (ZOFRAN) IV, sodium chloride, sodium chloride flush, traZODone   Vital Signs    Vitals:   09/25/16 2100 09/26/16 0148 09/26/16 0458 09/26/16 0725  BP: 112/65  96/82   Pulse: 75  70   Resp: 18  18   Temp: 98.3 F (36.8 C)  98.6 F (37 C)   TempSrc: Oral  Oral   SpO2: 92% 93% 95% 97%  Weight:   136 lb 7.4 oz (61.9 kg)     Intake/Output Summary (Last 24 hours) at 09/26/16 0748 Last data filed at 09/26/16 0300  Gross per 24 hour  Intake              923 ml  Output                0 ml  Net              923 ml   Filed Weights   09/24/16 0332 09/25/16 0446 09/26/16 0458  Weight: 135 lb 2.3 oz (61.3 kg) 134 lb 12.8 oz (61.1 kg) 136 lb 7.4 oz (61.9 kg)    Telemetry    SR, frequent PVC;s some couplets. - Personally Reviewed  Physical Exam   GEN: No acute distress, frail.   Neck: No JVD Cardiac: RRR, 1/6 high pitched systolic murmur, no murmurs, rubs, or gallops.  Respiratory: Bilateral rhonchi, especially at the  bases,coughing frequently with inspiration, sometimes productively.  GI: Soft, nontender, non-distended  MS: No edema; No deformity. Neuro:  Nonfocal  Psych: Normal affect   Labs    Chemistry Recent Labs Lab 09/24/16 0516 09/25/16 0432 09/26/16 0607  NA 135 135 135  K 3.3* 3.5 3.1*  CL 98* 96* 94*  CO2 '27 28 30  '$ GLUCOSE 102* 95 116*  BUN 28* 24* 20  CREATININE 1.17* 1.07* 1.19*  CALCIUM 8.4* 8.2* 8.5*  GFRNONAA 39* 44* 39*  GFRAA 46* 51* 45*  ANIONGAP '10 11 11     '$ Hematology Recent Labs Lab 09/23/16 0630  WBC 10.4  RBC 4.32  HGB 13.9  HCT 39.0  MCV 90.3  MCH 32.2  MCHC 35.6  RDW 15.0  PLT 402*    Cardiac Enzymes Recent Labs Lab 09/23/16 0630  TROPONINI 0.08*   No results for input(s): TROPIPOC in the last 168 hours.   BNP Recent Labs Lab 09/23/16 0630 09/25/16 0432  BNP 1,211.0* 608.0*     Radiology    No results  found.  Cardiac Studies   Echocardiogram 08/22/2016 Left ventricle: The cavity size was normal. Wall thickness was increased in a pattern of mild LVH. Systolic function was mildly reduced. The estimated ejection fraction was in the range of 45% to 50%. Diffuse hypokinesis. There is moderate hypokinesis of the basal-midinferolateral myocardium. Doppler parameters are consistent with abnormal left ventricular relaxation (grade 1 diastolic dysfunction). - Aortic valve: Mildly calcified annulus. Trileaflet; mildly calcified leaflets. There was mild regurgitation. Mean gradient (S): 5 mm Hg. - Mitral valve: Calcified annulus. There was mild regurgitation. - Left atrium: The atrium was mildly dilated. - Right atrium: The atrium was at the upper limits of normal in size. - Atrial septum: No defect or patent foramen ovale was identified. - Tricuspid valve: There was mild regurgitation. - Pulmonary arteries: PA peak pressure: 34 mm Hg (S). - Pericardium, extracardiac: A trivial pericardial effusion was   Patient  Profile     83 y.o. female with known history of hypertension, aortic sclerosis,  Hyperlipidemia, admitted with systolic CHF and pneumonia.   Assessment & Plan    1. Acute on Chronic Systolic CHF:  Has diuresed 3.1 liters since admission.Creatinine increased to 1.19, CO2 30. Will stop IV lasix today, and begin po lasix 20 mg daily starting tomorrow.. She is hypokalemic this am (3.1) Will give two doses of 40 mEq today and begin 40 mEq daily in the am. Magnesium 1.9.   2. Pneumonia: Speech therapy has ruled out aspiration per their note on 09/25/2016, but states that she is of increased aspiration risk with dyspnea during the times she eats, "possibly difficulty coordinating respiration with swallowing" Continue abx therapy and respiratory treatments per PCP. Consider PFT;s.   3. Hypertension:  Soft this am. Likely from diuresis. Change to ramipril at 2.5 mg daily tomorrow.  Signed, Jory Sims, NP  09/26/2016, 7:48 AM    Attending note:  Patient seen and examined. Reviewed interval chart. Discussed with Ms. Lawrence NP. Ms. Hodzic does not report any major improvement in her breathing, still has intermittent cough. We are planning to change Lasix to oral dose 20 mg daily beginning tomorrow, also replete potassium, and reduce Altace 2.5 mg beginning tomorrow. Systolic blood pressure has been somewhat low. Creatinine relatively stable at 1.19.  Satira Sark, M.D., F.A.C.C.

## 2016-09-26 NOTE — Progress Notes (Signed)
Patient admitted with several confounding variables with chest x-ray revealing atelectasis by him overload as well as question of bibasilar infiltrates she was placed on Unasyn upon admission consideration given to aspiration also placed on nebulizer therapy and diuresed for her left ventricular dysfunction as well as mild aortic insufficiency she has been improving with anticipated discharge Wednesday morning however laboratory called this morning with sputum positive for abundant MRSA repeat chest x-ray today reveals improved infiltrates I'm not certain whether this is contaminant or opportunistic infection nor MI certain as to duration of therapy she's been placed on vancomycin this morning we will ask for pulmonary consultation Katelyn Daniel HKV:[id number] DOB: 12-Oct-1923 DOA: 09/23/2016 PCP: [name], MD   Physical Exam: Blood pressure 96/82, pulse 70, temperature 98.6 F (37 C), temperature source Oral, resp. rate 18, height '5\' 7"'$  (1.702 m), weight 61.9 kg (136 lb 7.4 oz), SpO2 97 %. Lungs show scattered rhonchi bilaterally mild end expiratory wheeze diminished breath sounds in the bases no rales audible heart regular rhythm 1/6 aortic outflow murmur no heaves thrills or rubs audible   Investigations:  Recent Results (from the past 240 hour(s))  Culture, blood (routine x 2) Call MD if unable to obtain prior to antibiotics being given     Status: None (Preliminary result)   Collection Time: 09/23/16 12:31 PM  Result Value Ref Range Status   Specimen Description LEFT ANTECUBITAL  Final   Special Requests Blood Culture adequate volume  Final   Culture NO GROWTH 3 DAYS  Final   Report Status PENDING  Incomplete  Culture, blood (routine x 2) Call MD if unable to obtain prior to antibiotics being given     Status: None (Preliminary result)   Collection Time: 09/23/16 12:37 PM  Result Value Ref Range Status   Specimen Description LEFT ANTECUBITAL  Final   Special Requests Blood  Culture adequate volume  Final   Culture NO GROWTH 3 DAYS  Final   Report Status PENDING  Incomplete  Culture, sputum-assessment     Status: None   Collection Time: 09/24/16  8:00 AM  Result Value Ref Range Status   Specimen Description EXPECTORATED SPUTUM  Final   Special Requests NONE  Final   Sputum evaluation   Final    THIS SPECIMEN IS ACCEPTABLE FOR SPUTUM CULTURE Performed at [facility]    Report Status 09/24/2016 FINAL  Final  Culture, respiratory (NON-Expectorated)     Status: None   Collection Time: 09/24/16  8:00 AM  Result Value Ref Range Status   Specimen Description EXPECTORATED SPUTUM  Final   Special Requests NONE Reflexed from [id number]  Final   Gram Stain   Final    RARE WBC PRESENT, PREDOMINANTLY PMN MODERATE GRAM POSITIVE COCCI IN CLUSTERS Performed at [facility], [address], [place], [place] [address]    Culture   Final    ABUNDANT METHICILLIN RESISTANT STAPHYLOCOCCUS AUREUS   Report Status 09/26/2016 FINAL  Final   Organism ID, Bacteria METHICILLIN RESISTANT STAPHYLOCOCCUS AUREUS  Final      Susceptibility   Methicillin resistant staphylococcus aureus - MIC*    CIPROFLOXACIN >=8 RESISTANT Resistant     ERYTHROMYCIN >=8 RESISTANT Resistant     GENTAMICIN <=0.5 SENSITIVE Sensitive     OXACILLIN >=4 RESISTANT Resistant     TETRACYCLINE <=1 SENSITIVE Sensitive     VANCOMYCIN 1 SENSITIVE Sensitive     TRIMETH/SULFA >=320 RESISTANT Resistant     CLINDAMYCIN <=0.25 SENSITIVE Sensitive  RIFAMPIN <=0.5 SENSITIVE Sensitive     Inducible Clindamycin NEGATIVE Sensitive     * ABUNDANT METHICILLIN RESISTANT STAPHYLOCOCCUS AUREUS     Basic Metabolic Panel:  Recent Labs  09/24/16 0529 09/25/16 0432 09/26/16 0607  NA  --  135 135  K  --  3.5 3.1*  CL  --  96* 94*  CO2  --  28 30  GLUCOSE  --  95 116*  BUN  --  24* 20  CREATININE  --  1.07* 1.19*  CALCIUM  --  8.2* 8.5*  MG 1.9  --   --    Liver Function Tests: No results for  input(s): AST, ALT, ALKPHOS, BILITOT, PROT, ALBUMIN in the last 72 hours.   CBC: No results for input(s): WBC, NEUTROABS, HGB, HCT, MCV, PLT in the last 72 hours.  Dg Chest 2 View  Result Date: 09/26/2016 CLINICAL DATA:  Shortness of breath, CHF, hypertension EXAM: CHEST  2 VIEW COMPARISON:  09/23/2016 FINDINGS: Enlargement of cardiac silhouette with pulmonary vascular congestion. Atherosclerotic calcification and elongation of thoracic aorta. Bibasilar small pleural effusions and atelectasis. Improved pulmonary infiltrates suspect pulmonary edema. No segmental consolidation or pneumothorax. Underlying emphysematous changes present. Bones demineralized. IMPRESSION: Enlargement of cardiac silhouette with pulmonary vascular congestion and improved pulmonary infiltrates likely reflecting improved edema. Persistent small bibasilar pleural effusions and atelectasis. Underlying emphysematous changes. Electronically Signed   By: Lavonia Dana M.D.   On: 09/26/2016 09:22      Medications  Impression:  Principal Problem:   Aspiration pneumonia ([place]) Active Problems:   Hypothyroidism   Essential hypertension   Hyperlipidemia   Iron deficiency anemia   Acute on chronic combined systolic and diastolic CHF (congestive heart failure) ([place])     Plan: Pulmonary consultation to decide if this is contaminant versus opportunistic infection. Continue vancomycin and Unasyn. Continue nebulizer therapy and diuresis monitor electrolytes as well as hemodynamics  Consultants: Cardiology pulmonary requested   Procedures   Antibiotics: Vancomycin and Unasyn           Time spent: 30 minutes   LOS: 3 days   Kwane Rohl M   09/26/2016, 12:25 PM

## 2016-09-27 LAB — BASIC METABOLIC PANEL
Anion gap: 10 (ref 5–15)
BUN: 23 mg/dL — AB (ref 6–20)
CHLORIDE: 98 mmol/L — AB (ref 101–111)
CO2: 25 mmol/L (ref 22–32)
CREATININE: 1.02 mg/dL — AB (ref 0.44–1.00)
Calcium: 8.5 mg/dL — ABNORMAL LOW (ref 8.9–10.3)
GFR calc Af Amer: 54 mL/min — ABNORMAL LOW (ref >60)
GFR calc non Af Amer: 47 mL/min — ABNORMAL LOW (ref >60)
GLUCOSE: 141 mg/dL — AB (ref 65–99)
POTASSIUM: 4.5 mmol/L (ref 3.5–5.1)
Sodium: 133 mmol/L — ABNORMAL LOW (ref 135–145)

## 2016-09-27 MED ORDER — FUROSEMIDE 10 MG/ML IJ SOLN
40.0000 mg | Freq: Every day | INTRAMUSCULAR | Status: DC
  Administered 2016-09-27 – 2016-09-28 (×2): 40 mg via INTRAVENOUS
  Filled 2016-09-27 (×2): qty 4

## 2016-09-27 NOTE — Progress Notes (Signed)
Progress Note  Patient Name: Katelyn Daniel Date of Encounter: 09/27/2016  Primary Cardiologist: Dr. [name]  Subjective   Still with intermittent cough and dyspnea. No chest pain.  Inpatient Medications    Scheduled Meds: . bisoprolol  5 mg Oral Daily  . enoxaparin (LOVENOX) injection  30 mg Subcutaneous Q24H  . furosemide  20 mg Oral Daily  . gabapentin  300 mg Oral QHS  . iron polysaccharides  150 mg Oral Daily  . levalbuterol  0.63 mg Nebulization TID  . levothyroxine  50 mcg Oral Daily  . pantoprazole  40 mg Oral BID AC  . potassium chloride  40 mEq Oral Once  . pravastatin  40 mg Oral Daily  . ramipril  2.5 mg Oral Daily  . sodium chloride flush  3 mL Intravenous Q12H   Continuous Infusions: . sodium chloride    . ampicillin-sulbactam (UNASYN) IV Stopped (09/27/16 0011)  . vancomycin     PRN Meds: sodium chloride, acetaminophen, levalbuterol, ondansetron (ZOFRAN) IV, sodium chloride, sodium chloride flush, traZODone   Vital Signs    Vitals:   09/26/16 1528 09/26/16 1943 09/27/16 0517 09/27/16 0835  BP: (!) 141/94  (!) 142/104   Pulse: 72  82   Resp: 18  15   Temp: 97.9 F (36.6 C)  97 F (36.1 C)   TempSrc: Oral  Oral   SpO2: 97% 94% 99% 93%  Weight:   136 lb 0.4 oz (61.7 kg)   Height:        Intake/Output Summary (Last 24 hours) at 09/27/16 1008 Last data filed at 09/27/16 0700  Gross per 24 hour  Intake              783 ml  Output                0 ml  Net              783 ml   Filed Weights   09/25/16 0446 09/26/16 0458 09/27/16 0517  Weight: 134 lb 12.8 oz (61.1 kg) 136 lb 7.4 oz (61.9 kg) 136 lb 0.4 oz (61.7 kg)    Telemetry    Sinus rhythm with PVCs. Personally reviewed.  Physical Exam   GEN: Elderly woman. No acute distress.   Neck: No JVD. Cardiac: RRR, soft systolic murmur, no gallop.  Respiratory: Rhonchi bilaterally. GI: Soft, nontender, bowel sounds present. MS: No edema; No deformity.  Labs    Chemistry Recent  Labs Lab 09/25/16 0432 09/26/16 0607 09/27/16 0614  NA 135 135 133*  K 3.5 3.1* 4.5  CL 96* 94* 98*  CO2 '28 30 25  '$ GLUCOSE 95 116* 141*  BUN 24* 20 23*  CREATININE 1.07* 1.19* 1.02*  CALCIUM 8.2* 8.5* 8.5*  GFRNONAA 44* 39* 47*  GFRAA 51* 45* 54*  ANIONGAP '11 11 10     '$ Hematology Recent Labs Lab 09/23/16 0630 09/26/16 0607  WBC 10.4 10.5  RBC 4.32 4.53  HGB 13.9 14.5  HCT 39.0 43.9  MCV 90.3 96.9  MCH 32.2 32.0  MCHC 35.6 33.0  RDW 15.0 15.5  PLT 402* 427*    Cardiac Enzymes Recent Labs Lab 09/23/16 0630  TROPONINI 0.08*   No results for input(s): TROPIPOC in the last 168 hours.   BNP Recent Labs Lab 09/23/16 0630 09/25/16 0432  BNP 1,211.0* 608.0*     Radiology    Dg Chest 2 View  Result Date: 09/26/2016 CLINICAL DATA:  Shortness of breath, CHF, hypertension  EXAM: CHEST  2 VIEW COMPARISON:  09/23/2016 FINDINGS: Enlargement of cardiac silhouette with pulmonary vascular congestion. Atherosclerotic calcification and elongation of thoracic aorta. Bibasilar small pleural effusions and atelectasis. Improved pulmonary infiltrates suspect pulmonary edema. No segmental consolidation or pneumothorax. Underlying emphysematous changes present. Bones demineralized. IMPRESSION: Enlargement of cardiac silhouette with pulmonary vascular congestion and improved pulmonary infiltrates likely reflecting improved edema. Persistent small bibasilar pleural effusions and atelectasis. Underlying emphysematous changes. Electronically Signed   By: Lavonia Dana M.D.   On: 09/26/2016 09:22    Cardiac Studies   Echocardiogram 08/22/2016: Study Conclusions  - Left ventricle: The cavity size was normal. Wall thickness was   increased in a pattern of mild LVH. Systolic function was mildly   reduced. The estimated ejection fraction was in the range of 45%   to 50%. Diffuse hypokinesis. There is moderate hypokinesis of the   basal-midinferolateral myocardium. Doppler parameters are    consistent with abnormal left ventricular relaxation (grade 1   diastolic dysfunction). - Aortic valve: Mildly calcified annulus. Trileaflet; mildly   calcified leaflets. There was mild regurgitation. Mean gradient   (S): 5 mm Hg. - Mitral valve: Calcified annulus. There was mild regurgitation. - Left atrium: The atrium was mildly dilated. - Right atrium: The atrium was at the upper limits of normal in   size. - Atrial septum: No defect or patent foramen ovale was identified. - Tricuspid valve: There was mild regurgitation. - Pulmonary arteries: PA peak pressure: 34 mm Hg (S). - Pericardium, extracardiac: A trivial pericardial effusion was   identified posterior to the heart.  Impressions:  - Mild LVH with LVEF 45-50% and diffuse hypokinesis, more prominent   in the mid to basal inferolateral wall. Grade 1 diastolic   dysfunction. Mild left atrial enlargement. Mildly calcified   mitral annulus with mild mitral regurgitation. Sclerotic aortic   valve with mild aortic regurgitation. Mild tricuspid   regurgitation with PASP estimated 34 mmHg. Trivial posterior   pericardial effusion noted.  Patient Profile     83 y.o. female with history of cardiomyopathy, LVEF 45-50% range and mild diastolic dysfunction, aortic valve sclerosis without stenosis, and hypertension. She is currently admitted with combination of fluid overload and also pneumonia.  Assessment & Plan    1. Acute on chronic combined heart failure. LVEF 45-50% range with mild diastolic dysfunction. She had diuresed fairly well, although recent intake exceeds output.   2. Pneumonia, MRSA in sputum felt not to be contaminant, seen by Dr. Luan Pulling. She is being treated with IV antibiotics at this time.  3. Essential hypertension, blood pressure stable.  Reviewed current regimen which includes bisoprolol, oral Lasix, Pravachol, and Altace which was reduced to 2.5 mg daily. Plan to resume IV Lasix for now.  Signed, [name], MD  09/27/2016, 10:08 AM

## 2016-09-27 NOTE — Progress Notes (Signed)
P Altace Lasix Protonix trazodone bisoprolol atient is a 83-year-old previously healthy female who was admitted with increasing dyspnea over 1 month which was multifactorial she had a right lower lobe infiltrate possible aspiration considered then she had a left lower lobe infiltrate for which she was treated with Unasyn IV effectively she likewise has hypertension and hyperlipidemia which were well controlled while in hospital she had significant rhonchi and some mild degree of wheezing for which she was started on xoponex. The patient had mild aortic insufficiency with moderate left ventricular dysfunction EF 45-50% with some inferior regional wall motion abnormalities she was seen in consultation by cardiology she was diuresed with significant improvement in dyspnea as Unasyn was continued for pulmonary infiltrates. A day prior to discharge her sputum grew out MRSA for which pulmonary consultation was obtained however her chest x-ray significantly improved during this period Dr. Hawkins believe that a total of 72 hours of IV vancomycin which was added should be continued and then on oral anti-MRSA agent as an outpatient should be considered. I have taken the liberty of writing out all of her prescriptions including [facility] Dur Altace Xopenex nebulizer bisoprolol trazodone Protonix Pravacho prescription pads l Altace Lasix are all written on old fashion prescription pads that he only significant decision foreseeable is to choose an outpatient antibiotic regimen that covers MRSA after 72 hours of IV vancomycin in hospital. Katelyn Daniel MRN:[id number] DOB: 11/19/1923 DOA: 09/23/2016 PCP: , M, MD   Physical Exam: Blood pressure (!) 142/104, pulse 82, temperature 97 F (36.1 C), temperature source Oral, resp. rate 15, height 5' 7" (1.702 m), weight 61.7 kg (136 lb 0.4 oz), SpO2 93 %. Lungs show scattered coarse rhonchi bilaterally mild end expiratory wheeze no rales audible heart regular rhythm 1/6  aortic outflow Flow murmur no S3 no S4 auscultated no heaves thrills rubs   Investigations:  Recent Results (from the past 240 hour(s))  Culture, blood (routine x 2) Call MD if unable to obtain prior to antibiotics being given     Status: None (Preliminary result)   Collection Time: 09/23/16 12:31 PM  Result Value Ref Range Status   Specimen Description LEFT ANTECUBITAL  Final   Special Requests Blood Culture adequate volume  Final   Culture NO GROWTH 4 DAYS  Final   Report Status PENDING  Incomplete  Culture, blood (routine x 2) Call MD if unable to obtain prior to antibiotics being given     Status: None (Preliminary result)   Collection Time: 09/23/16 12:37 PM  Result Value Ref Range Status   Specimen Description LEFT ANTECUBITAL  Final   Special Requests Blood Culture adequate volume  Final   Culture NO GROWTH 4 DAYS  Final   Report Status PENDING  Incomplete  Culture, sputum-assessment     Status: None   Collection Time: 09/24/16  8:00 AM  Result Value Ref Range Status   Specimen Description EXPECTORATED SPUTUM  Final   Special Requests NONE  Final   Sputum evaluation   Final    THIS SPECIMEN IS ACCEPTABLE FOR SPUTUM CULTURE Performed at [facility]    Report Status 09/24/2016 FINAL  Final  Culture, respiratory (NON-Expectorated)     Status: None   Collection Time: 09/24/16  8:00 AM  Result Value Ref Range Status   Specimen Description EXPECTORATED SPUTUM  Final   Special Requests NONE Reflexed from [id number]  Final   Gram Stain   Final    RARE WBC PRESENT, PREDOMINANTLY PMN MODERATE GRAM   POSITIVE COCCI IN CLUSTERS Performed at [facility], [place] [address], [place], [place] [address]    Culture   Final    ABUNDANT METHICILLIN RESISTANT STAPHYLOCOCCUS AUREUS   Report Status 09/26/2016 FINAL  Final   Organism ID, Bacteria METHICILLIN RESISTANT STAPHYLOCOCCUS AUREUS  Final      Susceptibility   Methicillin resistant staphylococcus aureus - MIC*     CIPROFLOXACIN >=8 RESISTANT Resistant     ERYTHROMYCIN >=8 RESISTANT Resistant     GENTAMICIN <=0.5 SENSITIVE Sensitive     OXACILLIN >=4 RESISTANT Resistant     TETRACYCLINE <=1 SENSITIVE Sensitive     VANCOMYCIN 1 SENSITIVE Sensitive     TRIMETH/SULFA >=320 RESISTANT Resistant     CLINDAMYCIN <=0.25 SENSITIVE Sensitive     RIFAMPIN <=0.5 SENSITIVE Sensitive     Inducible Clindamycin NEGATIVE Sensitive     * ABUNDANT METHICILLIN RESISTANT STAPHYLOCOCCUS AUREUS     Basic Metabolic Panel:  Recent Labs  09/26/16 0607 09/27/16 0614  NA 135 133*  K 3.1* 4.5  CL 94* 98*  CO2 30 25  GLUCOSE 116* 141*  BUN 20 23*  CREATININE 1.19* 1.02*  CALCIUM 8.5* 8.5*   Liver Function Tests: No results for input(s): AST, ALT, ALKPHOS, BILITOT, PROT, ALBUMIN in the last 72 hours.   CBC:  Recent Labs  09/26/16 0607  WBC 10.5  NEUTROABS 7.5  HGB 14.5  HCT 43.9  MCV 96.9  PLT 427*    Dg Chest 2 View  Result Date: 09/26/2016 CLINICAL DATA:  Shortness of breath, CHF, hypertension EXAM: CHEST  2 VIEW COMPARISON:  09/23/2016 FINDINGS: Enlargement of cardiac silhouette with pulmonary vascular congestion. Atherosclerotic calcification and elongation of thoracic aorta. Bibasilar small pleural effusions and atelectasis. Improved pulmonary infiltrates suspect pulmonary edema. No segmental consolidation or pneumothorax. Underlying emphysematous changes present. Bones demineralized. IMPRESSION: Enlargement of cardiac silhouette with pulmonary vascular congestion and improved pulmonary infiltrates likely reflecting improved edema. Persistent small bibasilar pleural effusions and atelectasis. Underlying emphysematous changes. Electronically Signed   By: Lavonia Dana M.D.   On: 09/26/2016 09:22      Medications:   Impression: MRSA Principal Problem:   Aspiration pneumonia ([facility]) Active Problems:   Hypothyroidism   Essential hypertension   Hyperlipidemia   Iron deficiency anemia   Acute on  chronic combined systolic and diastolic CHF (congestive heart failure) ([facility])     Plan: Continue current regimen monitor be met and hemodynamics continue IV vancomycin for an additional 48 hours then consider discharge as per pulmonary all of her current medicines are written on prescription pads and given to nurse with the exception of Dr. Ria Comment choice of outpatient antibiotic anti-MRSA regimen. Dr. Willey Blade will be covering Thursday through the weekend  Consultants: Cardiology pulmonology   Procedures   Antibiotics: Unasyn & Vancomycin         Time spent: 45 minutes   LOS: 4 days   Michole Lecuyer M   09/27/2016, 1:33 PM

## 2016-09-27 NOTE — Consult Note (Signed)
Full note to follow. She says she still feels bad and is short of breath. She still has significant rhonchi on her chest examination. I think her MRSA in the sputum is probably not a contaminant although it's difficult to be certain about that. I would treat her since she is still symptomatic for another 48 hours IV and then I would switch her to oral medications.

## 2016-09-27 NOTE — Progress Notes (Signed)
With the patients consent the patients HCPOA was updated on the patients care.  He was made aware that there were prescriptions at the desk.

## 2016-09-28 LAB — BASIC METABOLIC PANEL
Anion gap: 11 (ref 5–15)
BUN: 26 mg/dL — AB (ref 6–20)
CO2: 23 mmol/L (ref 22–32)
Calcium: 8.4 mg/dL — ABNORMAL LOW (ref 8.9–10.3)
Chloride: 96 mmol/L — ABNORMAL LOW (ref 101–111)
Creatinine, Ser: 1.08 mg/dL — ABNORMAL HIGH (ref 0.44–1.00)
GFR calc Af Amer: 50 mL/min — ABNORMAL LOW (ref >60)
GFR calc non Af Amer: 44 mL/min — ABNORMAL LOW (ref >60)
GLUCOSE: 145 mg/dL — AB (ref 65–99)
POTASSIUM: 4.3 mmol/L (ref 3.5–5.1)
Sodium: 130 mmol/L — ABNORMAL LOW (ref 135–145)

## 2016-09-28 LAB — CULTURE, BLOOD (ROUTINE X 2)
CULTURE: NO GROWTH
Culture: NO GROWTH
SPECIAL REQUESTS: ADEQUATE
Special Requests: ADEQUATE

## 2016-09-28 MED ORDER — FUROSEMIDE 10 MG/ML IJ SOLN: 20.0000 mg | Freq: Every day | INTRAMUSCULAR | Status: DC

## 2016-09-28 MED ORDER — LEVALBUTEROL HCL 0.63 MG/3ML IN NEBU
0.6300 mg | INHALATION_SOLUTION | Freq: Two times a day (BID) | RESPIRATORY_TRACT | Status: DC
  Administered 2016-09-28: 0.63 mg via RESPIRATORY_TRACT
  Filled 2016-09-28: qty 3

## 2016-09-28 NOTE — Consult Note (Signed)
Katelyn Daniel, Katelyn Daniel               ACCOUNT NO.:  [id number]  MEDICAL RECORD NO.:  [id number]  LOCATION:  [facility]                         FACILITY:  [facility]  PHYSICIAN:  Yan Pankratz L. Luan Pulling, M.D.DATE OF BIRTH:  Katelyn Daniel  DATE OF CONSULTATION: DATE OF DISCHARGE:                                CONSULTATION   REASON FOR CONSULTATION:  Pneumonia, MRSA in the sputum.  CONSULTING PHYSICIAN:  Lorriane Shire, MD.  HISTORY:  This is a 83 year old who is admitted to the hospital with what appeared to be pneumonia/congestive heart failure and was started on Unasyn because of concerns about aspiration.  She is felt to be at higher than average risk of aspiration and has seen the speech therapist and was given instructions about what to do to help with that.  She seems to be improving.  Her chest x-ray, which I have personally reviewed, looked a little bit better, but she grew large amount of MRSA in her sputum.  When I talked to her, she says that she still feels like she is more short of breath than she usually is.  She is coughing still. She does admit to getting chills occasionally.  She has not had any chest pain.  She is not having any PND or orthopnea now, but she has noticed that she has had some swelling of her legs.  No hemoptysis.  No nausea, vomiting, or diarrhea.  Her cough is mostly nonproductive.  She has been seen by Cardiology.  She has had diuresis.  PAST MEDICAL HISTORY:  Positive for hypertension, hyperlipidemia, hypothyroidism, aortic insufficiency, iron-deficiency anemia.  She initially said that her symptoms of shortness of breath, cough, and congestion were worse when she was lying down, but that seems to be less so now.  PAST SURGICAL HISTORY:  Positive for tonsillectomy, some sort of sinus surgery, colonoscopy and EGD.  SOCIAL HISTORY:  She lives at home.  She does not smoke and has never. She does not use any alcohol or any other illicit drugs.  ALLERGIES:  No known drug  allergies.  FAMILY HISTORY:  Positive for asthma in her mother, COPD in her father and congestive heart failure in her mother.  MEDICATIONS:  I have reviewed her prior to admission medications.  I reviewed her current medications.  REVIEW OF SYSTEMS:  Except as mentioned, 10-point review of systems is negative.  PHYSICAL EXAMINATION:  VITAL SIGNS:  Her vital signs are as recorded. CONSTITUTIONAL:  In general, she is a thin but well-developed elderly female, who is in no acute distress.  She is coughing occasionally during the examination. EYES:  Pupils react.  EOMI. EARS, NOSE, MOUTH, AND THROAT:  Her mucous membranes are moist.  Her gag reflex is normal. CARDIOVASCULAR:  Her heart is regular without gallop.  She does have no edema.  Pulses are intact. RESPIRATORY:  Her respiratory effort is slightly increased.  Her lungs show wheezing and rhonchi bilaterally. GASTROINTESTINAL:  Her abdomen is soft with no masses.  Bowel sounds are present. MUSCULOSKELETAL:  She has normal strength in her extremities. SKIN:  Warm and dry. NEUROLOGICAL:  No focal abnormalities. PSYCHIATRIC:  Normal mood and affect.  I reviewed her blood testing.  I reviewed her chest x-ray personally. Although she does seem to be getting better as far as her chest x-ray looks, she is still complaining of being more short of breath and having cough and congestion.  I think it is reasonable to put her on vancomycin for another day or 2 IV.  At that point, I think we can switch her to oral medications.  Thanks for allowing me to see her with you.     [name] L. Luan Pulling, M.D.     ELH/MEDQ  D:  09/27/2016  T:  09/28/2016  Job:  [id number]

## 2016-09-28 NOTE — Progress Notes (Signed)
Subjective: She says she feels a little better. She has less congestion in her chest. She says she still feels short of breath but just observing her in the room she does look substantially better. She's complaining of some nasal congestion.  Objective: Vital signs in last 24 hours: Temp:  [97.1 F (36.2 C)-97.9 F (36.6 C)] 97.1 F (36.2 C) (04/26 0711) Pulse Rate:  [73-81] 78 (04/26 0711) Resp:  [16-18] 18 (04/26 0711) BP: (105-136)/(68-75) 136/68 (04/26 0711) SpO2:  [94 %-97 %] 97 % (04/26 0711) Weight:  [60.8 kg (134 lb 0.6 oz)] 60.8 kg (134 lb 0.6 oz) (04/26 0711) Weight change:  Last BM Date: 09/26/16  Intake/Output from previous day: 04/25 0701 - 04/26 0700 In: 240 [P.O.:240] Out: 700 [Urine:700]  PHYSICAL EXAM General appearance: alert, cooperative and mild distress Resp: rhonchi bilaterally Cardio: regular rate and rhythm, S1, S2 normal, no murmur, click, rub or gallop GI: soft, non-tender; bowel sounds normal; no masses,  no organomegaly Extremities: extremities normal, atraumatic, no cyanosis or edema Skin warm and dry.  Lab Results:  Results for orders placed or performed during the hospital encounter of 09/23/16 (from the past 48 hour(s))  Basic metabolic panel     Status: Abnormal   Collection Time: 09/27/16  6:14 AM  Result Value Ref Range   Sodium 133 (L) 135 - 145 mmol/L   Potassium 4.5 3.5 - 5.1 mmol/L    Comment: DELTA CHECK NOTED   Chloride 98 (L) 101 - 111 mmol/L   CO2 25 22 - 32 mmol/L   Glucose, Bld 141 (H) 65 - 99 mg/dL   BUN 23 (H) 6 - 20 mg/dL   Creatinine, Ser 1.02 (H) 0.44 - 1.00 mg/dL   Calcium 8.5 (L) 8.9 - 10.3 mg/dL   GFR calc non Af Amer 47 (L) >60 mL/min   GFR calc Af Amer 54 (L) >60 mL/min    Comment: (NOTE) The eGFR has been calculated using the CKD EPI equation. This calculation has not been validated in all clinical situations. eGFR's persistently <60 mL/min signify possible Chronic Kidney Disease.    Anion gap 10 5 - 15   Basic metabolic panel     Status: Abnormal   Collection Time: 09/28/16  5:53 AM  Result Value Ref Range   Sodium 130 (L) 135 - 145 mmol/L   Potassium 4.3 3.5 - 5.1 mmol/L   Chloride 96 (L) 101 - 111 mmol/L   CO2 23 22 - 32 mmol/L   Glucose, Bld 145 (H) 65 - 99 mg/dL   BUN 26 (H) 6 - 20 mg/dL   Creatinine, Ser 1.08 (H) 0.44 - 1.00 mg/dL   Calcium 8.4 (L) 8.9 - 10.3 mg/dL   GFR calc non Af Amer 44 (L) >60 mL/min   GFR calc Af Amer 50 (L) >60 mL/min    Comment: (NOTE) The eGFR has been calculated using the CKD EPI equation. This calculation has not been validated in all clinical situations. eGFR's persistently <60 mL/min signify possible Chronic Kidney Disease.    Anion gap 11 5 - 15    ABGS No results for input(s): PHART, PO2ART, TCO2, HCO3 in the last 72 hours.  Invalid input(s): PCO2 CULTURES Recent Results (from the past 240 hour(s))  Culture, blood (routine x 2) Call MD if unable to obtain prior to antibiotics being given     Status: None (Preliminary result)   Collection Time: 09/23/16 12:31 PM  Result Value Ref Range Status   Specimen Description LEFT ANTECUBITAL  Final   Special Requests Blood Culture adequate volume  Final   Culture NO GROWTH 4 DAYS  Final   Report Status PENDING  Incomplete  Culture, blood (routine x 2) Call MD if unable to obtain prior to antibiotics being given     Status: None (Preliminary result)   Collection Time: 09/23/16 12:37 PM  Result Value Ref Range Status   Specimen Description LEFT ANTECUBITAL  Final   Special Requests Blood Culture adequate volume  Final   Culture NO GROWTH 4 DAYS  Final   Report Status PENDING  Incomplete  Culture, sputum-assessment     Status: None   Collection Time: 09/24/16  8:00 AM  Result Value Ref [place] Status   Specimen Description EXPECTORATED SPUTUM  Final   Special Requests NONE  Final   Sputum evaluation   Final    THIS SPECIMEN IS ACCEPTABLE FOR SPUTUM CULTURE Performed at [facility]     Report Status 09/24/2016 FINAL  Final  Culture, respiratory (NON-Expectorated)     Status: None   Collection Time: 09/24/16  8:00 AM  Result Value Ref [place] Status   Specimen Description EXPECTORATED SPUTUM  Final   Special Requests NONE Reflexed from [id number]  Final   Gram Stain   Final    RARE WBC PRESENT, PREDOMINANTLY PMN MODERATE GRAM POSITIVE COCCI IN CLUSTERS Performed at [facility], [place] [address], [place], [place] [address]    Culture   Final    ABUNDANT METHICILLIN RESISTANT STAPHYLOCOCCUS AUREUS   Report Status 09/26/2016 FINAL  Final   Organism ID, Bacteria METHICILLIN RESISTANT STAPHYLOCOCCUS AUREUS  Final      Susceptibility   Methicillin resistant staphylococcus aureus - MIC*    CIPROFLOXACIN >=8 RESISTANT Resistant     ERYTHROMYCIN >=8 RESISTANT Resistant     GENTAMICIN <=0.5 SENSITIVE Sensitive     OXACILLIN >=4 RESISTANT Resistant     TETRACYCLINE <=1 SENSITIVE Sensitive     VANCOMYCIN 1 SENSITIVE Sensitive     TRIMETH/SULFA >=320 RESISTANT Resistant     CLINDAMYCIN <=0.25 SENSITIVE Sensitive     RIFAMPIN <=0.5 SENSITIVE Sensitive     Inducible Clindamycin NEGATIVE Sensitive     * ABUNDANT METHICILLIN RESISTANT STAPHYLOCOCCUS AUREUS   Studies/Results: Dg Chest 2 View  Result Date: 09/26/2016 CLINICAL DATA:  Shortness of breath, CHF, hypertension EXAM: CHEST  2 VIEW COMPARISON:  09/23/2016 FINDINGS: Enlargement of cardiac silhouette with pulmonary vascular congestion. Atherosclerotic calcification and elongation of thoracic aorta. Bibasilar small pleural effusions and atelectasis. Improved pulmonary infiltrates suspect pulmonary edema. No segmental consolidation or pneumothorax. Underlying emphysematous changes present. Bones demineralized. IMPRESSION: Enlargement of cardiac silhouette with pulmonary vascular congestion and improved pulmonary infiltrates likely reflecting improved edema. Persistent small bibasilar pleural effusions and atelectasis. Underlying  emphysematous changes. Electronically Signed   By: Lavonia Dana M.D.   On: 09/26/2016 09:22    Medications:  Prior to Admission:  Prescriptions Prior to Admission  Medication Sig Dispense Refill Last Dose  . Calcium Citrate-Vitamin D (CALCIUM CITRATE + D PO) Take 1 tablet by mouth daily.   09/22/2016 at Unknown time  . furosemide (LASIX) 20 MG tablet Take 1 tablet (20 mg total) by mouth daily. 90 tablet 3 09/22/2016 at Unknown time  . gabapentin (NEURONTIN) 300 MG capsule Take 300 mg by mouth at bedtime.   09/22/2016 at Unknown time  . hydrochlorothiazide (MICROZIDE) 12.5 MG capsule Take 1 capsule (12.5 mg total) by mouth daily. 90 capsule 3 09/22/2016 at Unknown time  .  levothyroxine (SYNTHROID, LEVOTHROID) 50 MCG tablet Take 50 mcg by mouth daily.   09/22/2016 at Unknown time  . Multiple Vitamins-Minerals (PRESERVISION AREDS PO) Take 1 capsule by mouth daily.   09/22/2016 at Unknown time  . naproxen (NAPROSYN) 500 MG tablet Take 500 mg by mouth 2 (two) times daily as needed for mild pain.    Past Week at Unknown time  . pantoprazole (PROTONIX) 40 MG tablet Take 1 tablet (40 mg total) by mouth 2 (two) times daily before a meal. 60 tablet 3 09/22/2016 at Unknown time  . Polysacchar Iron-FA-B12 (FERREX 150 FORTE) 150-1-25 MG-MG-MCG CAPS Take 1 capsule by mouth daily. 30 capsule 11 09/22/2016 at Unknown time  . potassium chloride SA (K-DUR,KLOR-CON) 20 MEQ tablet Take 1 tablet (20 mEq total) by mouth daily. 90 tablet 3 09/22/2016 at Unknown time  . pravastatin (PRAVACHOL) 40 MG tablet Take 40 mg by mouth daily.  3 09/22/2016 at Unknown time  . traZODone (DESYREL) 50 MG tablet Take 1 tablet (50 mg total) by mouth at bedtime as needed for sleep. 30 tablet 3 Past Week at Unknown time   Scheduled: . bisoprolol  5 mg Oral Daily  . enoxaparin (LOVENOX) injection  30 mg Subcutaneous Q24H  . furosemide  40 mg Intravenous Daily  . gabapentin  300 mg Oral QHS  . iron polysaccharides  150 mg Oral Daily  .  levalbuterol  0.63 mg Nebulization TID  . levothyroxine  50 mcg Oral Daily  . pantoprazole  40 mg Oral BID AC  . pravastatin  40 mg Oral Daily  . ramipril  2.5 mg Oral Daily  . sodium chloride flush  3 mL Intravenous Q12H   Continuous: . sodium chloride    . ampicillin-sulbactam (UNASYN) IV Stopped (09/27/16 2347)  . vancomycin     DQV:HQITUY chloride, acetaminophen, levalbuterol, ondansetron (ZOFRAN) IV, sodium chloride, sodium chloride flush, traZODone  Assesment: She was admitted with pneumonia and acute on chronic combined systolic and diastolic heart failure. She is doing better with this. She has pneumonia as well. She is growing MRSA in her sputum but she looks much better today. Principal Problem:   Aspiration pneumonia ([place]) Active Problems:   Hypothyroidism   Essential hypertension   Hyperlipidemia   Iron deficiency anemia   Acute on chronic combined systolic and diastolic CHF (congestive heart failure) ([place])    Plan: I would give her another day of vancomycin but I think she may be ready for discharge tomorrow.    LOS: 5 days   Katelyn Daniel L 09/28/2016, 8:36 AM

## 2016-09-28 NOTE — Progress Notes (Signed)
Progress Note  Patient Name: Katelyn Daniel Date of Encounter: 09/28/2016  Primary Cardiologist: [name], MD  Subjective   Beginning to feel better. Breathing improved. Less coughing.   Inpatient Medications    Scheduled Meds: . bisoprolol  5 mg Oral Daily  . enoxaparin (LOVENOX) injection  30 mg Subcutaneous Q24H  . furosemide  40 mg Intravenous Daily  . gabapentin  300 mg Oral QHS  . iron polysaccharides  150 mg Oral Daily  . levalbuterol  0.63 mg Nebulization TID  . levothyroxine  50 mcg Oral Daily  . pantoprazole  40 mg Oral BID AC  . pravastatin  40 mg Oral Daily  . ramipril  2.5 mg Oral Daily  . sodium chloride flush  3 mL Intravenous Q12H   Continuous Infusions: . sodium chloride    . ampicillin-sulbactam (UNASYN) IV 3 g (09/28/16 0907)  . vancomycin     PRN Meds: sodium chloride, acetaminophen, levalbuterol, ondansetron (ZOFRAN) IV, sodium chloride, sodium chloride flush, traZODone   Vital Signs    Vitals:   09/27/16 1452 09/27/16 2005 09/27/16 2058 09/28/16 0711  BP:   125/73 136/68  Pulse:   73 78  Resp:   18 18  Temp:   97.3 F (36.3 C) 97.1 F (36.2 C)  TempSrc:   Oral Oral  SpO2: 95% 96% 96% 97%  Weight:    134 lb 0.6 oz (60.8 kg)  Height:        Intake/Output Summary (Last 24 hours) at 09/28/16 1005 Last data filed at 09/28/16 0900  Gross per 24 hour  Intake              480 ml  Output             1000 ml  Net             -520 ml   Filed Weights   09/26/16 0458 09/27/16 0517 09/28/16 0711  Weight: 136 lb 7.4 oz (61.9 kg) 136 lb 0.4 oz (61.7 kg) 134 lb 0.6 oz (60.8 kg)    Telemetry    SR, occasional PVC's- Personally reviewed  Physical Exam   GEN: Elderly woman. No acute distress.   Neck: No JVD Cardiac: IRRR, no murmurs, rubs, or gallops.  Respiratory: Inspiratory rales, with scatter bibasilar rhonchi. No wheezing or coughing.  GI: Soft, nontender, non-distended  MS: No edema; No deformity.  Labs     Chemistry  Recent Labs Lab 09/26/16 [id number] 09/27/16 0614 09/28/16 0553  NA 135 133* 130*  K 3.1* 4.5 4.3  CL 94* 98* 96*  CO2 '30 25 23  '$ GLUCOSE 116* 141* 145*  BUN 20 23* 26*  CREATININE 1.19* 1.02* 1.08*  CALCIUM 8.5* 8.5* 8.4*  GFRNONAA 39* 47* 44*  GFRAA 45* 54* 50*  ANIONGAP '11 10 11     '$ Hematology  Recent Labs Lab 09/23/16 0630 09/26/16 0607  WBC 10.4 10.5  RBC 4.32 4.53  HGB 13.9 14.5  HCT 39.0 43.9  MCV 90.3 96.9  MCH 32.2 32.0  MCHC 35.6 33.0  RDW 15.0 15.5  PLT 402* 427*    Cardiac Enzymes  Recent Labs Lab 09/23/16 0630  TROPONINI 0.08*   No results for input(s): TROPIPOC in the last 168 hours.   BNP  Recent Labs Lab 09/23/16 0630 09/25/16 0432  BNP 1,211.0* 608.0*      Radiology    No results found.  Cardiac Studies   Echocardiogram 08/22/2016: Study Conclusions  - Left ventricle: The cavity  size was normal. Wall thickness was increased in a pattern of mild LVH. Systolic function was mildly reduced. The estimated ejection fraction was in the range of 45% to 50%. Diffuse hypokinesis. There is moderate hypokinesis of the basal-midinferolateral myocardium. Doppler parameters are consistent with abnormal left ventricular relaxation (grade 1 diastolic dysfunction). - Aortic valve: Mildly calcified annulus. Trileaflet; mildly calcified leaflets. There was mild regurgitation. Mean gradient (S): 5 mm Hg. - Mitral valve: Calcified annulus. There was mild regurgitation. - Left atrium: The atrium was mildly dilated. - Right atrium: The atrium was at the upper limits of normal in size. - Atrial septum: No defect or patent foramen ovale was identified. - Tricuspid valve: There was mild regurgitation. - Pulmonary arteries: PA peak pressure: 34 mm Hg (S). - Pericardium, extracardiac: A trivial pericardial effusion was identified posterior to the heart.  Impressions:  - Mild LVH with LVEF 45-50% and diffuse  hypokinesis, more prominent in the mid to basal inferolateral wall. Grade 1 diastolic dysfunction. Mild left atrial enlargement. Mildly calcified mitral annulus with mild mitral regurgitation. Sclerotic aortic valve with mild aortic regurgitation. Mild tricuspid regurgitation with PASP estimated 34 mmHg. Trivial posterior pericardial effusion noted.  Patient Profile     83 y.o. female with history of cardiomyopathy, LVEF 45-50% range and mild diastolic dysfunction, aortic valve sclerosis without stenosis, and hypertension. She is currently admitted with combination of fluid overload and also pneumonia.  Assessment & Plan    1. Acute on Chronic Mixed CHF: EF of 45% - 50% on echo. She has diuresed 2.5 liters wt down from 142 at it's highest recorded weight to 134 lbs this, down 2 lbs from yesterday. She was placed back on IV lasix 40 mg daily on 09/27/2016. Creatinine 1.08 this am (1.02 yesterday).  Na is decreasing. Will decrease IV lasix to 20 mg daily.   2. MRSA Pneumonia: Followed by Dr. Luan Pulling. She is now on Vancomycin. Receiving breathing treatments. States she has a stuffy nose. Dr. Luan Pulling is providing assistance with this.   3. Essential Hypertension: Currently stable on ramipril 2.5 mg daily and bisoprolol 5 mg daily.   Signed, Jory Sims, NP 09/28/2016, 10:05 AM     Attending note:  Patient seen and examined. Agree with above assessment by Ms. Lawrence NP. Breathing status has started to improve and she is feeling better. Weight is down about 8 pounds from presentation, diuresis has picked back up on IV Lasix. Creatinine stable at 1.08. Agree with reducing dose of Lasix to 20 mg IV daily. Dr. Luan Pulling continues to follow pulmonary status.  Satira Sark, M.D., F.A.C.C.

## 2016-09-28 NOTE — Care Management Note (Signed)
Case Management Note  Patient Details  Name: Katelyn Daniel MRN: [id number] Date of Birth: [birth year]  If discussed at [place] Length of Stay Meetings, dates discussed:  09/28/2016  Additional Comments: [place] home tomorrow. CM following. Pt to ambulate in hall with RN today.   Sherald Barge, RN 09/28/2016, 10:56 AM

## 2016-09-28 NOTE — Progress Notes (Signed)
Subjective: She feels that she is breathing better. She denies any sputum production now. She has been afebrile. Oxygen saturations are normal. Objective: Vital signs in last 24 hours: Vitals:   09/27/16 1452 09/27/16 2005 09/27/16 2058 09/28/16 0711  BP:   125/73 136/68  Pulse:   73 78  Resp:   18 18  Temp:   97.3 F (36.3 C) 97.1 F (36.2 C)  TempSrc:   Oral Oral  SpO2: 95% 96% 96% 97%  Weight:    134 lb 0.6 oz (60.8 kg)  Height:       Weight change:   Intake/Output Summary (Last 24 hours) at 09/28/16 0747 Last data filed at 09/27/16 1900  Gross per 24 hour  Intake              240 ml  Output              700 ml  Net             -460 ml    Physical Exam: Alert. No distress. Lungs reveal mild rhonchi. Heart regular with a grade 1 systolic murmur. Extremities reveal no edema.  Lab Results:    Results for orders placed or performed during the hospital encounter of 09/23/16 (from the past 24 hour(s))  Basic metabolic panel     Status: Abnormal   Collection Time: 09/28/16  5:53 AM  Result Value Ref Range   Sodium 130 (L) 135 - 145 mmol/L   Potassium 4.3 3.5 - 5.1 mmol/L   Chloride 96 (L) 101 - 111 mmol/L   CO2 23 22 - 32 mmol/L   Glucose, Bld 145 (H) 65 - 99 mg/dL   BUN 26 (H) 6 - 20 mg/dL   Creatinine, Ser 1.08 (H) 0.44 - 1.00 mg/dL   Calcium 8.4 (L) 8.9 - 10.3 mg/dL   GFR calc non Af Amer 44 (L) >60 mL/min   GFR calc Af Amer 50 (L) >60 mL/min   Anion gap 11 5 - 15     ABGS No results for input(s): PHART, PO2ART, TCO2, HCO3 in the last 72 hours.  Invalid input(s): PCO2 CULTURES Recent Results (from the past 240 hour(s))  Culture, blood (routine x 2) Call MD if unable to obtain prior to antibiotics being given     Status: None (Preliminary result)   Collection Time: 09/23/16 12:31 PM  Result Value Ref Range Status   Specimen Description LEFT ANTECUBITAL  Final   Special Requests Blood Culture adequate volume  Final   Culture NO GROWTH 4 DAYS  Final   Report  Status PENDING  Incomplete  Culture, blood (routine x 2) Call MD if unable to obtain prior to antibiotics being given     Status: None (Preliminary result)   Collection Time: 09/23/16 12:37 PM  Result Value Ref Range Status   Specimen Description LEFT ANTECUBITAL  Final   Special Requests Blood Culture adequate volume  Final   Culture NO GROWTH 4 DAYS  Final   Report Status PENDING  Incomplete  Culture, sputum-assessment     Status: None   Collection Time: 09/24/16  8:00 AM  Result Value Ref Range Status   Specimen Description EXPECTORATED SPUTUM  Final   Special Requests NONE  Final   Sputum evaluation   Final    THIS SPECIMEN IS ACCEPTABLE FOR SPUTUM CULTURE Performed at [facility]    Report Status 09/24/2016 FINAL  Final  Culture, respiratory (NON-Expectorated)     Status: None  Collection Time: 09/24/16  8:00 AM  Result Value Ref Range Status   Specimen Description EXPECTORATED SPUTUM  Final   Special Requests NONE Reflexed from [id number]  Final   Gram Stain   Final    RARE WBC PRESENT, PREDOMINANTLY PMN MODERATE GRAM POSITIVE COCCI IN CLUSTERS Performed at [facility], [address], [place], [place] [address]    Culture   Final    ABUNDANT METHICILLIN RESISTANT STAPHYLOCOCCUS AUREUS   Report Status 09/26/2016 FINAL  Final   Organism ID, Bacteria METHICILLIN RESISTANT STAPHYLOCOCCUS AUREUS  Final      Susceptibility   Methicillin resistant staphylococcus aureus - MIC*    CIPROFLOXACIN >=8 RESISTANT Resistant     ERYTHROMYCIN >=8 RESISTANT Resistant     GENTAMICIN <=0.5 SENSITIVE Sensitive     OXACILLIN >=4 RESISTANT Resistant     TETRACYCLINE <=1 SENSITIVE Sensitive     VANCOMYCIN 1 SENSITIVE Sensitive     TRIMETH/SULFA >=320 RESISTANT Resistant     CLINDAMYCIN <=0.25 SENSITIVE Sensitive     RIFAMPIN <=0.5 SENSITIVE Sensitive     Inducible Clindamycin NEGATIVE Sensitive     * ABUNDANT METHICILLIN RESISTANT STAPHYLOCOCCUS AUREUS   Studies/Results: Dg  Chest 2 View  Result Date: 09/26/2016 CLINICAL DATA:  Shortness of breath, CHF, hypertension EXAM: CHEST  2 VIEW COMPARISON:  09/23/2016 FINDINGS: Enlargement of cardiac silhouette with pulmonary vascular congestion. Atherosclerotic calcification and elongation of thoracic aorta. Bibasilar small pleural effusions and atelectasis. Improved pulmonary infiltrates suspect pulmonary edema. No segmental consolidation or pneumothorax. Underlying emphysematous changes present. Bones demineralized. IMPRESSION: Enlargement of cardiac silhouette with pulmonary vascular congestion and improved pulmonary infiltrates likely reflecting improved edema. Persistent small bibasilar pleural effusions and atelectasis. Underlying emphysematous changes. Electronically Signed   By: Lavonia Dana M.D.   On: 09/26/2016 09:22   Micro Results: Recent Results (from the past 240 hour(s))  Culture, blood (routine x 2) Call MD if unable to obtain prior to antibiotics being given     Status: None (Preliminary result)   Collection Time: 09/23/16 12:31 PM  Result Value Ref Range Status   Specimen Description LEFT ANTECUBITAL  Final   Special Requests Blood Culture adequate volume  Final   Culture NO GROWTH 4 DAYS  Final   Report Status PENDING  Incomplete  Culture, blood (routine x 2) Call MD if unable to obtain prior to antibiotics being given     Status: None (Preliminary result)   Collection Time: 09/23/16 12:37 PM  Result Value Ref Range Status   Specimen Description LEFT ANTECUBITAL  Final   Special Requests Blood Culture adequate volume  Final   Culture NO GROWTH 4 DAYS  Final   Report Status PENDING  Incomplete  Culture, sputum-assessment     Status: None   Collection Time: 09/24/16  8:00 AM  Result Value Ref Range Status   Specimen Description EXPECTORATED SPUTUM  Final   Special Requests NONE  Final   Sputum evaluation   Final    THIS SPECIMEN IS ACCEPTABLE FOR SPUTUM CULTURE Performed at [facility]     Report Status 09/24/2016 FINAL  Final  Culture, respiratory (NON-Expectorated)     Status: None   Collection Time: 09/24/16  8:00 AM  Result Value Ref Range Status   Specimen Description EXPECTORATED SPUTUM  Final   Special Requests NONE Reflexed from [id number]  Final   Gram Stain   Final    RARE WBC PRESENT, PREDOMINANTLY PMN MODERATE GRAM POSITIVE COCCI IN CLUSTERS Performed at [facility]  [facility], [place] [address] [place], [place] [address]    Culture   Final    ABUNDANT METHICILLIN RESISTANT STAPHYLOCOCCUS AUREUS   Report Status 09/26/2016 FINAL  Final   Organism ID, Bacteria METHICILLIN RESISTANT STAPHYLOCOCCUS AUREUS  Final      Susceptibility   Methicillin resistant staphylococcus aureus - MIC*    CIPROFLOXACIN >=8 RESISTANT Resistant     ERYTHROMYCIN >=8 RESISTANT Resistant     GENTAMICIN <=0.5 SENSITIVE Sensitive     OXACILLIN >=4 RESISTANT Resistant     TETRACYCLINE <=1 SENSITIVE Sensitive     VANCOMYCIN 1 SENSITIVE Sensitive     TRIMETH/SULFA >=320 RESISTANT Resistant     CLINDAMYCIN <=0.25 SENSITIVE Sensitive     RIFAMPIN <=0.5 SENSITIVE Sensitive     Inducible Clindamycin NEGATIVE Sensitive     * ABUNDANT METHICILLIN RESISTANT STAPHYLOCOCCUS AUREUS   Studies/Results: Dg Chest 2 View  Result Date: 09/26/2016 CLINICAL DATA:  Shortness of breath, CHF, hypertension EXAM: CHEST  2 VIEW COMPARISON:  09/23/2016 FINDINGS: Enlargement of cardiac silhouette with pulmonary vascular congestion. Atherosclerotic calcification and elongation of thoracic aorta. Bibasilar small pleural effusions and atelectasis. Improved pulmonary infiltrates suspect pulmonary edema. No segmental consolidation or pneumothorax. Underlying emphysematous changes present. Bones demineralized. IMPRESSION: Enlargement of cardiac silhouette with pulmonary vascular congestion and improved pulmonary infiltrates likely reflecting improved edema. Persistent small bibasilar pleural effusions and atelectasis. Underlying  emphysematous changes. Electronically Signed   By: Lavonia Dana M.D.   On: 09/26/2016 09:22   Medications:  I have reviewed the patient's current medications Scheduled Meds: . bisoprolol  5 mg Oral Daily  . enoxaparin (LOVENOX) injection  30 mg Subcutaneous Q24H  . furosemide  40 mg Intravenous Daily  . gabapentin  300 mg Oral QHS  . iron polysaccharides  150 mg Oral Daily  . levalbuterol  0.63 mg Nebulization TID  . levothyroxine  50 mcg Oral Daily  . pantoprazole  40 mg Oral BID AC  . pravastatin  40 mg Oral Daily  . ramipril  2.5 mg Oral Daily  . sodium chloride flush  3 mL Intravenous Q12H   Continuous Infusions: . sodium chloride    . ampicillin-sulbactam (UNASYN) IV Stopped (09/27/16 2347)  . vancomycin     PRN Meds:.sodium chloride, acetaminophen, levalbuterol, ondansetron (ZOFRAN) IV, sodium chloride, sodium chloride flush, traZODone   Assessment/Plan: #1. Pneumonia. MRSA. Continue vancomycin. Most recent white count normal at 10.5. #2. Acute on chronic combined heart failure. Continue Lasix and Altase. BUN and creatinine are 26 and 1.08. Principal Problem:   Aspiration pneumonia ([place]) Active Problems:   Hypothyroidism   Essential hypertension   Hyperlipidemia   Iron deficiency anemia   Acute on chronic combined systolic and diastolic CHF (congestive heart failure) ([place])     LOS: 5 days   Katelyn Daniel 09/28/2016, 7:47 AM

## 2016-09-29 LAB — BASIC METABOLIC PANEL
Anion gap: 9 (ref 5–15)
BUN: 29 mg/dL — ABNORMAL HIGH (ref 6–20)
CHLORIDE: 95 mmol/L — AB (ref 101–111)
CO2: 24 mmol/L (ref 22–32)
CREATININE: 1.16 mg/dL — AB (ref 0.44–1.00)
Calcium: 8.3 mg/dL — ABNORMAL LOW (ref 8.9–10.3)
GFR calc Af Amer: 46 mL/min — ABNORMAL LOW (ref >60)
GFR calc non Af Amer: 40 mL/min — ABNORMAL LOW (ref >60)
GLUCOSE: 118 mg/dL — AB (ref 65–99)
Potassium: 3.9 mmol/L (ref 3.5–5.1)
Sodium: 128 mmol/L — ABNORMAL LOW (ref 135–145)

## 2016-09-29 MED ORDER — FUROSEMIDE 20 MG PO TABS
20.0000 mg | ORAL_TABLET | Freq: Every day | ORAL | Status: DC
  Administered 2016-09-30 – 2016-10-02 (×3): 20 mg via ORAL
  Filled 2016-09-29 (×3): qty 1

## 2016-09-29 MED ORDER — ALPRAZOLAM 0.25 MG PO TABS
0.2500 mg | ORAL_TABLET | ORAL | Status: DC | PRN
  Administered 2016-09-29 – 2016-10-02 (×9): 0.25 mg via ORAL
  Filled 2016-09-29 (×10): qty 1

## 2016-09-29 NOTE — Progress Notes (Signed)
Pharmacy Antibiotic Note  Katelyn Daniel is a 83 y.o. female admitted on 09/23/2016 with pneumonia.  Pharmacy has been consulted for UNASYN dosing. 4/24 vancomycin added for MRSA in sputum  Plan: Vnacomycin 750 mg IV q24 hours (no dose charted as given 4/26) Cont Unasyn to 3gm IV every 8 hours. f/u renal function, cultures and clinical course  Height: '5\' 7"'$  (170.2 cm) Weight: 133 lb 6.4 oz (60.5 kg) IBW/kg (Calculated) : 61.6  Temp (24hrs), Avg:97.6 F (36.4 C), Min:97.5 F (36.4 C), Max:97.8 F (36.6 C)   Recent Labs Lab 09/23/16 0630  09/25/16 0432 09/26/16 0607 09/27/16 0614 09/28/16 0553 09/29/16 0541  WBC 10.4  --   --  10.5  --   --   --   CREATININE 1.20*  < > 1.07* 1.19* 1.02* 1.08* 1.16*  < > = values in this interval not displayed.  Estimated Creatinine Clearance: 30.2 mL/min (A) (by C-G formula based on SCr of 1.16 mg/dL (H)).    No Known Allergies  Antimicrobials this admission: Unasyn 4/21 >>  Vanc  4/24>>   Dose adjustments this admission:  Microbiology results: 4/22 BCx: ngtd 4/22 Sputum:  MRSA  Thank you for allowing pharmacy to be a part of this patient's care.  Excell Seltzer Poteet 09/29/2016 10:09 AM

## 2016-09-29 NOTE — Progress Notes (Signed)
Progress Note  Patient Name: Katelyn Daniel Date of Encounter: 09/29/2016  Primary Cardiologist: [name], MD  Subjective   Breathing better, wants to go home.  Inpatient Medications    Scheduled Meds: . bisoprolol  5 mg Oral Daily  . furosemide  20 mg Intravenous Daily  . gabapentin  300 mg Oral QHS  . iron polysaccharides  150 mg Oral Daily  . levothyroxine  50 mcg Oral Daily  . pantoprazole  40 mg Oral BID AC  . pravastatin  40 mg Oral Daily  . ramipril  2.5 mg Oral Daily  . sodium chloride flush  3 mL Intravenous Q12H   Continuous Infusions: . sodium chloride    . ampicillin-sulbactam (UNASYN) IV Stopped (09/29/16 0016)  . vancomycin 750 mg (09/28/16 1301)   PRN Meds: sodium chloride, acetaminophen, levalbuterol, ondansetron (ZOFRAN) IV, sodium chloride, sodium chloride flush, traZODone   Vital Signs    Vitals:   09/28/16 1339 09/28/16 2034 09/28/16 2100 09/29/16 0424  BP: 126/71  128/72 126/81  Pulse: 76  78 (!) 50  Resp: '18  18 18  '$ Temp: 97.8 F (36.6 C)  97.5 F (36.4 C) 97.6 F (36.4 C)  TempSrc: Oral  Oral Axillary  SpO2: 98% 97% 99% 95%  Weight:    133 lb 6.4 oz (60.5 kg)  Height:        Intake/Output Summary (Last 24 hours) at 09/29/16 0808 Last data filed at 09/28/16 1700  Gross per 24 hour  Intake              840 ml  Output              300 ml  Net              540 ml   Filed Weights   09/27/16 0517 09/28/16 0711 09/29/16 0424  Weight: 136 lb 0.4 oz (61.7 kg) 134 lb 0.6 oz (60.8 kg) 133 lb 6.4 oz (60.5 kg)    Telemetry    SR, occasional PVC's. Personally reviewed.  Physical Exam   GEN: Elderly woman. No acute distress.   Neck: No JVD Cardiac: IRRR, no murmurs, rubs, or gallops.  Respiratory: Inspiratory rales, with scatter bibasilar rhonchi. No wheezing or coughing.  GI: Soft, nontender, non-distended  MS: No edema; No deformity.Diminished DP in dependent position.   Labs    Chemistry  Recent Labs Lab 09/27/16 [phone]  09/28/16 0553 09/29/16 0541  NA 133* 130* 128*  K 4.5 4.3 3.9  CL 98* 96* 95*  CO2 '25 23 24  '$ GLUCOSE 141* 145* 118*  BUN 23* 26* 29*  CREATININE 1.02* 1.08* 1.16*  CALCIUM 8.5* 8.4* 8.3*  GFRNONAA 47* 44* 40*  GFRAA 54* 50* 46*  ANIONGAP '10 11 9     '$ Hematology  Recent Labs Lab 09/23/16 0630 09/26/16 0607  WBC 10.4 10.5  RBC 4.32 4.53  HGB 13.9 14.5  HCT 39.0 43.9  MCV 90.3 96.9  MCH 32.2 32.0  MCHC 35.6 33.0  RDW 15.0 15.5  PLT 402* 427*    Cardiac Enzymes  Recent Labs Lab 09/23/16 0630  TROPONINI 0.08*   No results for input(s): TROPIPOC in the last 168 hours.   BNP  Recent Labs Lab 09/23/16 0630 09/25/16 0432  BNP 1,211.0* 608.0*      Radiology    No results found.  Cardiac Studies   Echocardiogram 08/22/2016: Study Conclusions  - Left ventricle: The cavity size was normal. Wall thickness was increased in a  pattern of mild LVH. Systolic function was mildly reduced. The estimated ejection fraction was in the range of 45% to 50%. Diffuse hypokinesis. There is moderate hypokinesis of the basal-midinferolateral myocardium. Doppler parameters are consistent with abnormal left ventricular relaxation (grade 1 diastolic dysfunction). - Aortic valve: Mildly calcified annulus. Trileaflet; mildly calcified leaflets. There was mild regurgitation. Mean gradient (S): 5 mm Hg. - Mitral valve: Calcified annulus. There was mild regurgitation. - Left atrium: The atrium was mildly dilated. - Right atrium: The atrium was at the upper limits of normal in size. - Atrial septum: No defect or patent foramen ovale was identified. - Tricuspid valve: There was mild regurgitation. - Pulmonary arteries: PA peak pressure: 34 mm Hg (S). - Pericardium, extracardiac: A trivial pericardial effusion was identified posterior to the heart.  Impressions:  - Mild LVH with LVEF 45-50% and diffuse hypokinesis, more prominent in the mid to basal  inferolateral wall. Grade 1 diastolic dysfunction. Mild left atrial enlargement. Mildly calcified mitral annulus with mild mitral regurgitation. Sclerotic aortic valve with mild aortic regurgitation. Mild tricuspid regurgitation with PASP estimated 34 mmHg. Trivial posterior pericardial effusion noted.  Patient Profile     83 y.o. female with history of cardiomyopathy, LVEF 45-50% range and mild diastolic dysfunction, aortic valve sclerosis without stenosis, and hypertension. She is currently admitted with combination of fluid overload and also pneumonia.  Assessment & Plan    1. Acute on Chronic Mixed CHF: EF of 45% - 50% on echocardiogram. She has diuresed reasonably well and creatinine is increasing from 1.08 to 1.16 this am. Na 128. Will stop IV lasix and begin lasix 20 mg po daily to begin in am.  2. MRSA Pneumonia: Followed by Dr. Luan Pulling. She is now on Vancomycin. Receiving breathing treatments.   3. Essential Hypertension: Currently stable, no evidence of hypotension, on ramipril 2.5 mg daily and bisoprolol 5 mg daily.   Per Dr. Ria Comment note, should go home tomorrow. Will make follow up appointment with cardiology.   Signed, Jory Sims, NP 09/29/2016, 8:08 AM      Attending note:  Patient seen and examined. Discussed with Ms. Lawrence NP. Agree with conversion back to oral Lasix. Otherwise continue Zebeta, Altace, and Pravachol. Anticipate discharge soon. We will arrange office follow-up.  Satira Sark, M.D., F.A.C.C.

## 2016-09-29 NOTE — Progress Notes (Signed)
Subjective: Alert. Coughing without mucus production. No fever. Vital signs stable.  Objective: Vital signs in last 24 hours: Vitals:   09/28/16 1339 09/28/16 2034 09/28/16 2100 09/29/16 0424  BP: 126/71  128/72 126/81  Pulse: 76  78 (!) 50  Resp: '18  18 18  '$ Temp: 97.8 F (36.6 C)  97.5 F (36.4 C) 97.6 F (36.4 C)  TempSrc: Oral  Oral Axillary  SpO2: 98% 97% 99% 95%  Weight:    133 lb 6.4 oz (60.5 kg)  Height:       Weight change:   Intake/Output Summary (Last 24 hours) at 09/29/16 0710 Last data filed at 09/28/16 1700  Gross per 24 hour  Intake              840 ml  Output              300 ml  Net              540 ml    Physical Exam: Dyspneic. Mild basilar rales. Heart regular with no murmurs. Extremities reveal no edema.  Lab Results:    Results for orders placed or performed during the hospital encounter of 09/23/16 (from the past 24 hour(s))  Basic metabolic panel     Status: Abnormal   Collection Time: 09/29/16  5:41 AM  Result Value Ref Range   Sodium 128 (L) 135 - 145 mmol/L   Potassium 3.9 3.5 - 5.1 mmol/L   Chloride 95 (L) 101 - 111 mmol/L   CO2 24 22 - 32 mmol/L   Glucose, Bld 118 (H) 65 - 99 mg/dL   BUN 29 (H) 6 - 20 mg/dL   Creatinine, Ser 1.16 (H) 0.44 - 1.00 mg/dL   Calcium 8.3 (L) 8.9 - 10.3 mg/dL   GFR calc non Af Amer 40 (L) >60 mL/min   GFR calc Af Amer 46 (L) >60 mL/min   Anion gap 9 5 - 15     ABGS No results for input(s): PHART, PO2ART, TCO2, HCO3 in the last 72 hours.  Invalid input(s): PCO2 CULTURES Recent Results (from the past 240 hour(s))  Culture, blood (routine x 2) Call MD if unable to obtain prior to antibiotics being given     Status: None   Collection Time: 09/23/16 12:31 PM  Result Value Ref Range Status   Specimen Description LEFT ANTECUBITAL  Final   Special Requests Blood Culture adequate volume  Final   Culture NO GROWTH 5 DAYS  Final   Report Status 09/28/2016 FINAL  Final  Culture, blood (routine x 2) Call MD if  unable to obtain prior to antibiotics being given     Status: None   Collection Time: 09/23/16 12:37 PM  Result Value Ref Range Status   Specimen Description LEFT ANTECUBITAL  Final   Special Requests Blood Culture adequate volume  Final   Culture NO GROWTH 5 DAYS  Final   Report Status 09/28/2016 FINAL  Final  Culture, sputum-assessment     Status: None   Collection Time: 09/24/16  8:00 AM  Result Value Ref Range Status   Specimen Description EXPECTORATED SPUTUM  Final   Special Requests NONE  Final   Sputum evaluation   Final    THIS SPECIMEN IS ACCEPTABLE FOR SPUTUM CULTURE Performed at [facility]    Report Status 09/24/2016 FINAL  Final  Culture, respiratory (NON-Expectorated)     Status: None   Collection Time: 09/24/16  8:00 AM  Result Value Ref Range Status  Specimen Description EXPECTORATED SPUTUM  Final   Special Requests NONE Reflexed from [id number]  Final   Gram Stain   Final    RARE WBC PRESENT, PREDOMINANTLY PMN MODERATE GRAM POSITIVE COCCI IN CLUSTERS Performed at [facility], [place] [address], [place], [place] [address]    Culture   Final    ABUNDANT METHICILLIN RESISTANT STAPHYLOCOCCUS AUREUS   Report Status 09/26/2016 FINAL  Final   Organism ID, Bacteria METHICILLIN RESISTANT STAPHYLOCOCCUS AUREUS  Final      Susceptibility   Methicillin resistant staphylococcus aureus - MIC*    CIPROFLOXACIN >=8 RESISTANT Resistant     ERYTHROMYCIN >=8 RESISTANT Resistant     GENTAMICIN <=0.5 SENSITIVE Sensitive     OXACILLIN >=4 RESISTANT Resistant     TETRACYCLINE <=1 SENSITIVE Sensitive     VANCOMYCIN 1 SENSITIVE Sensitive     TRIMETH/SULFA >=320 RESISTANT Resistant     CLINDAMYCIN <=0.25 SENSITIVE Sensitive     RIFAMPIN <=0.5 SENSITIVE Sensitive     Inducible Clindamycin NEGATIVE Sensitive     * ABUNDANT METHICILLIN RESISTANT STAPHYLOCOCCUS AUREUS   Studies/Results: No results found. Micro Results: Recent Results (from the past 240 hour(s))  Culture,  blood (routine x 2) Call MD if unable to obtain prior to antibiotics being given     Status: None   Collection Time: 09/23/16 12:31 PM  Result Value Ref Range Status   Specimen Description LEFT ANTECUBITAL  Final   Special Requests Blood Culture adequate volume  Final   Culture NO GROWTH 5 DAYS  Final   Report Status 09/28/2016 FINAL  Final  Culture, blood (routine x 2) Call MD if unable to obtain prior to antibiotics being given     Status: None   Collection Time: 09/23/16 12:37 PM  Result Value Ref Range Status   Specimen Description LEFT ANTECUBITAL  Final   Special Requests Blood Culture adequate volume  Final   Culture NO GROWTH 5 DAYS  Final   Report Status 09/28/2016 FINAL  Final  Culture, sputum-assessment     Status: None   Collection Time: 09/24/16  8:00 AM  Result Value Ref Range Status   Specimen Description EXPECTORATED SPUTUM  Final   Special Requests NONE  Final   Sputum evaluation   Final    THIS SPECIMEN IS ACCEPTABLE FOR SPUTUM CULTURE Performed at [facility]    Report Status 09/24/2016 FINAL  Final  Culture, respiratory (NON-Expectorated)     Status: None   Collection Time: 09/24/16  8:00 AM  Result Value Ref Range Status   Specimen Description EXPECTORATED SPUTUM  Final   Special Requests NONE Reflexed from [id number]  Final   Gram Stain   Final    RARE WBC PRESENT, PREDOMINANTLY PMN MODERATE GRAM POSITIVE COCCI IN CLUSTERS Performed at [facility], [address], [place], [place] [address]    Culture   Final    ABUNDANT METHICILLIN RESISTANT STAPHYLOCOCCUS AUREUS   Report Status 09/26/2016 FINAL  Final   Organism ID, Bacteria METHICILLIN RESISTANT STAPHYLOCOCCUS AUREUS  Final      Susceptibility   Methicillin resistant staphylococcus aureus - MIC*    CIPROFLOXACIN >=8 RESISTANT Resistant     ERYTHROMYCIN >=8 RESISTANT Resistant     GENTAMICIN <=0.5 SENSITIVE Sensitive     OXACILLIN >=4 RESISTANT Resistant     TETRACYCLINE <=1 SENSITIVE  Sensitive     VANCOMYCIN 1 SENSITIVE Sensitive     TRIMETH/SULFA >=320 RESISTANT Resistant     CLINDAMYCIN <=0.25  SENSITIVE Sensitive     RIFAMPIN <=0.5 SENSITIVE Sensitive     Inducible Clindamycin NEGATIVE Sensitive     * ABUNDANT METHICILLIN RESISTANT STAPHYLOCOCCUS AUREUS   Studies/Results: No results found. Medications:  I have reviewed the patient's current medications Scheduled Meds: . bisoprolol  5 mg Oral Daily  . furosemide  20 mg Intravenous Daily  . gabapentin  300 mg Oral QHS  . iron polysaccharides  150 mg Oral Daily  . levothyroxine  50 mcg Oral Daily  . pantoprazole  40 mg Oral BID AC  . pravastatin  40 mg Oral Daily  . ramipril  2.5 mg Oral Daily  . sodium chloride flush  3 mL Intravenous Q12H   Continuous Infusions: . sodium chloride    . ampicillin-sulbactam (UNASYN) IV Stopped (09/29/16 0016)  . vancomycin 750 mg (09/28/16 1301)   PRN Meds:.sodium chloride, acetaminophen, levalbuterol, ondansetron (ZOFRAN) IV, sodium chloride, sodium chloride flush, traZODone   Assessment/Plan: #1. Pneumonia. Continue Unasyn and vancomycin. #2. Combined systolic/diastolic heart failure. Continue Lasix. Creatinine 1.16. Potassium 3.9. #3. Hyponatremia. Sodium 128.  Plan possible discharge Saturday. Principal Problem:   Aspiration pneumonia ([place]) Active Problems:   Hypothyroidism   Essential hypertension   Hyperlipidemia   Iron deficiency anemia   Acute on chronic combined systolic and diastolic CHF (congestive heart failure) ([place])     LOS: 6 days   Katelyn Daniel 09/29/2016, 7:10 AM

## 2016-09-29 NOTE — Progress Notes (Signed)
Patient SOB despite nebulizer treatment at 1406. She seemed a little anxious and request that Dr.Hawkins be called to order something for her breathing. [name] stated  " I won't be able to make it through the night like this." Patient placed on 2L [place] for comfort and her nurse was notified.

## 2016-09-29 NOTE — Progress Notes (Signed)
Subjective: She says she feels better. She is coughing less. No new complaints.  Objective: Vital signs in last 24 hours: Temp:  [97.5 F (36.4 C)-97.8 F (36.6 C)] 97.6 F (36.4 C) (04/27 0424) Pulse Rate:  [50-78] 50 (04/27 0424) Resp:  [18] 18 (04/27 0424) BP: (126-128)/(71-81) 126/81 (04/27 0424) SpO2:  [95 %-99 %] 95 % (04/27 0424) Weight:  [60.5 kg (133 lb 6.4 oz)] 60.5 kg (133 lb 6.4 oz) (04/27 0424) Weight change:  Last BM Date: 09/28/16  Intake/Output from previous day: 04/26 0701 - 04/27 0700 In: 840 [P.O.:840] Out: 300 [Urine:300]  PHYSICAL EXAM General appearance: alert, cooperative and no distress Resp: rhonchi bilaterally Cardio: regular rate and rhythm, S1, S2 normal, no murmur, click, rub or gallop GI: soft, non-tender; bowel sounds normal; no masses,  no organomegaly Extremities: extremities normal, atraumatic, no cyanosis or edema Skin warm and dry  Lab Results:  Results for orders placed or performed during the hospital encounter of 09/23/16 (from the past 48 hour(s))  Basic metabolic panel     Status: Abnormal   Collection Time: 09/28/16  5:53 AM  Result Value Ref Range   Sodium 130 (L) 135 - 145 mmol/L   Potassium 4.3 3.5 - 5.1 mmol/L   Chloride 96 (L) 101 - 111 mmol/L   CO2 23 22 - 32 mmol/L   Glucose, Bld 145 (H) 65 - 99 mg/dL   BUN 26 (H) 6 - 20 mg/dL   Creatinine, Ser 1.08 (H) 0.44 - 1.00 mg/dL   Calcium 8.4 (L) 8.9 - 10.3 mg/dL   GFR calc non Af Amer 44 (L) >60 mL/min   GFR calc Af Amer 50 (L) >60 mL/min    Comment: (NOTE) The eGFR has been calculated using the CKD EPI equation. This calculation has not been validated in all clinical situations. eGFR's persistently <60 mL/min signify possible Chronic Kidney Disease.    Anion gap 11 5 - 15  Basic metabolic panel     Status: Abnormal   Collection Time: 09/29/16  5:41 AM  Result Value Ref Range   Sodium 128 (L) 135 - 145 mmol/L   Potassium 3.9 3.5 - 5.1 mmol/L   Chloride 95 (L) 101 -  111 mmol/L   CO2 24 22 - 32 mmol/L   Glucose, Bld 118 (H) 65 - 99 mg/dL   BUN 29 (H) 6 - 20 mg/dL   Creatinine, Ser 1.16 (H) 0.44 - 1.00 mg/dL   Calcium 8.3 (L) 8.9 - 10.3 mg/dL   GFR calc non Af Amer 40 (L) >60 mL/min   GFR calc Af Amer 46 (L) >60 mL/min    Comment: (NOTE) The eGFR has been calculated using the CKD EPI equation. This calculation has not been validated in all clinical situations. eGFR's persistently <60 mL/min signify possible Chronic Kidney Disease.    Anion gap 9 5 - 15    ABGS No results for input(s): PHART, PO2ART, TCO2, HCO3 in the last 72 hours.  Invalid input(s): PCO2 CULTURES Recent Results (from the past 240 hour(s))  Culture, blood (routine x 2) Call MD if unable to obtain prior to antibiotics being given     Status: None   Collection Time: 09/23/16 12:31 PM  Result Value Ref Range Status   Specimen Description LEFT ANTECUBITAL  Final   Special Requests Blood Culture adequate volume  Final   Culture NO GROWTH 5 DAYS  Final   Report Status 09/28/2016 FINAL  Final  Culture, blood (routine x 2) Call MD  if unable to obtain prior to antibiotics being given     Status: None   Collection Time: 09/23/16 12:37 PM  Result Value Ref Range Status   Specimen Description LEFT ANTECUBITAL  Final   Special Requests Blood Culture adequate volume  Final   Culture NO GROWTH 5 DAYS  Final   Report Status 09/28/2016 FINAL  Final  Culture, sputum-assessment     Status: None   Collection Time: 09/24/16  8:00 AM  Result Value Ref Range Status   Specimen Description EXPECTORATED SPUTUM  Final   Special Requests NONE  Final   Sputum evaluation   Final    THIS SPECIMEN IS ACCEPTABLE FOR SPUTUM CULTURE Performed at [facility]    Report Status 09/24/2016 FINAL  Final  Culture, respiratory (NON-Expectorated)     Status: None   Collection Time: 09/24/16  8:00 AM  Result Value Ref Range Status   Specimen Description EXPECTORATED SPUTUM  Final   Special Requests  NONE Reflexed from [id number]  Final   Gram Stain   Final    RARE WBC PRESENT, PREDOMINANTLY PMN MODERATE GRAM POSITIVE COCCI IN CLUSTERS Performed at [facility], [address] [place], [place] [address]    Culture   Final    ABUNDANT METHICILLIN RESISTANT STAPHYLOCOCCUS AUREUS   Report Status 09/26/2016 FINAL  Final   Organism ID, Bacteria METHICILLIN RESISTANT STAPHYLOCOCCUS AUREUS  Final      Susceptibility   Methicillin resistant staphylococcus aureus - MIC*    CIPROFLOXACIN >=8 RESISTANT Resistant     ERYTHROMYCIN >=8 RESISTANT Resistant     GENTAMICIN <=0.5 SENSITIVE Sensitive     OXACILLIN >=4 RESISTANT Resistant     TETRACYCLINE <=1 SENSITIVE Sensitive     VANCOMYCIN 1 SENSITIVE Sensitive     TRIMETH/SULFA >=320 RESISTANT Resistant     CLINDAMYCIN <=0.25 SENSITIVE Sensitive     RIFAMPIN <=0.5 SENSITIVE Sensitive     Inducible Clindamycin NEGATIVE Sensitive     * ABUNDANT METHICILLIN RESISTANT STAPHYLOCOCCUS AUREUS   Studies/Results: No results found.  Medications:  Prior to Admission:  Prescriptions Prior to Admission  Medication Sig Dispense Refill Last Dose  . Calcium Citrate-Vitamin D (CALCIUM CITRATE + D PO) Take 1 tablet by mouth daily.   09/22/2016 at Unknown time  . furosemide (LASIX) 20 MG tablet Take 1 tablet (20 mg total) by mouth daily. 90 tablet 3 09/22/2016 at Unknown time  . gabapentin (NEURONTIN) 300 MG capsule Take 300 mg by mouth at bedtime.   09/22/2016 at Unknown time  . hydrochlorothiazide (MICROZIDE) 12.5 MG capsule Take 1 capsule (12.5 mg total) by mouth daily. 90 capsule 3 09/22/2016 at Unknown time  . levothyroxine (SYNTHROID, LEVOTHROID) 50 MCG tablet Take 50 mcg by mouth daily.   09/22/2016 at Unknown time  . Multiple Vitamins-Minerals (PRESERVISION AREDS PO) Take 1 capsule by mouth daily.   09/22/2016 at Unknown time  . naproxen (NAPROSYN) 500 MG tablet Take 500 mg by mouth 2 (two) times daily as needed for mild pain.    Past Week at Unknown time   . pantoprazole (PROTONIX) 40 MG tablet Take 1 tablet (40 mg total) by mouth 2 (two) times daily before a meal. 60 tablet 3 09/22/2016 at Unknown time  . Polysacchar Iron-FA-B12 (FERREX 150 FORTE) 150-1-25 MG-MG-MCG CAPS Take 1 capsule by mouth daily. 30 capsule 11 09/22/2016 at Unknown time  . potassium chloride SA (K-DUR,KLOR-CON) 20 MEQ tablet Take 1 tablet (20 mEq total) by mouth daily. 90 tablet 3  09/22/2016 at Unknown time  . pravastatin (PRAVACHOL) 40 MG tablet Take 40 mg by mouth daily.  3 09/22/2016 at Unknown time  . traZODone (DESYREL) 50 MG tablet Take 1 tablet (50 mg total) by mouth at bedtime as needed for sleep. 30 tablet 3 Past Week at Unknown time   Scheduled: . bisoprolol  5 mg Oral Daily  . [START ON 09/30/2016] furosemide  20 mg Oral Daily  . gabapentin  300 mg Oral QHS  . iron polysaccharides  150 mg Oral Daily  . levothyroxine  50 mcg Oral Daily  . pantoprazole  40 mg Oral BID AC  . pravastatin  40 mg Oral Daily  . ramipril  2.5 mg Oral Daily  . sodium chloride flush  3 mL Intravenous Q12H   Continuous: . sodium chloride    . ampicillin-sulbactam (UNASYN) IV Stopped (09/29/16 0016)  . vancomycin 750 mg (09/28/16 1301)   VVZ:SMOLMB chloride, acetaminophen, levalbuterol, ondansetron (ZOFRAN) IV, sodium chloride, sodium chloride flush, traZODone  Assesment: She has pneumonia. She has MRSA in her sputum. She has acute on chronic combined systolic and diastolic heart failure and seems to be improving. She is much better as far as the pneumonia is concerned. Principal Problem:   Aspiration pneumonia ([place]) Active Problems:   Hypothyroidism   Essential hypertension   Hyperlipidemia   Iron deficiency anemia   Acute on chronic combined systolic and diastolic CHF (congestive heart failure) ([facility])    Plan: Discussed with Dr. Willey Blade. I think she is okay for discharge tomorrow as he plans. She should be on clindamycin for a week after that. I will plan to sign off. Thanks for  allowing me to see her with you    LOS: 6 days   Relda Agosto L 09/29/2016, 8:57 AM

## 2016-09-29 NOTE — Care Management Important Message (Signed)
Important Message  Patient Details  Name: Katelyn Daniel MRN: [id number] Date of Birth: [birth year]   Medicare Important Message Given:  Yes    Sherald Barge, RN 09/29/2016, 1:24 PM

## 2016-09-30 LAB — BASIC METABOLIC PANEL
Anion gap: 9 (ref 5–15)
BUN: 28 mg/dL — AB (ref 6–20)
CHLORIDE: 93 mmol/L — AB (ref 101–111)
CO2: 27 mmol/L (ref 22–32)
CREATININE: 1.17 mg/dL — AB (ref 0.44–1.00)
Calcium: 8.7 mg/dL — ABNORMAL LOW (ref 8.9–10.3)
GFR calc Af Amer: 46 mL/min — ABNORMAL LOW (ref >60)
GFR, EST NON AFRICAN AMERICAN: 39 mL/min — AB (ref >60)
Glucose, Bld: 104 mg/dL — ABNORMAL HIGH (ref 65–99)
Potassium: 4.3 mmol/L (ref 3.5–5.1)
Sodium: 129 mmol/L — ABNORMAL LOW (ref 135–145)

## 2016-09-30 MED ORDER — PREDNISONE 20 MG PO TABS
40.0000 mg | ORAL_TABLET | Freq: Every day | ORAL | Status: DC
  Administered 2016-09-30 – 2016-10-03 (×4): 40 mg via ORAL
  Filled 2016-09-30 (×4): qty 2

## 2016-09-30 MED ORDER — ENSURE ENLIVE PO LIQD
237.0000 mL | Freq: Two times a day (BID) | ORAL | Status: DC
  Administered 2016-09-30 – 2016-10-03 (×5): 237 mL via ORAL

## 2016-09-30 NOTE — Progress Notes (Signed)
Subjective: I had signed off yesterday but saw that she had more respiratory problems through the night. She says she's better this morning but feels like she's more short of breath than she was yesterday morning. She had received extra breathing treatments several times yesterday.  Objective: Vital signs in last 24 hours: Temp:  [97.7 F (36.5 C)-98.9 F (37.2 C)] 98.9 F (37.2 C) (04/28 0650) Pulse Rate:  [66-84] 82 (04/28 0650) Resp:  [17-18] 18 (04/28 0650) BP: (127-137)/(82-96) 137/87 (04/28 0650) SpO2:  [95 %-99 %] 97 % (04/28 0650) Weight change:  Last BM Date: 09/28/16  Intake/Output from previous day: 04/27 0701 - 04/28 0700 In: 600 [P.O.:600] Out: -   PHYSICAL EXAM General appearance: alert, cooperative and mild distress Resp: Still some rhonchi but clearing from the initial time I heard Cardio: regular rate and rhythm, S1, S2 normal, no murmur, click, rub or gallop GI: soft, non-tender; bowel sounds normal; no masses,  no organomegaly Extremities: extremities normal, atraumatic, no cyanosis or edema Skin warm and dry. She is anxious  Lab Results:  Results for orders placed or performed during the hospital encounter of 09/23/16 (from the past 48 hour(s))  Basic metabolic panel     Status: Abnormal   Collection Time: 09/29/16  5:41 AM  Result Value Ref Range   Sodium 128 (L) 135 - 145 mmol/L   Potassium 3.9 3.5 - 5.1 mmol/L   Chloride 95 (L) 101 - 111 mmol/L   CO2 24 22 - 32 mmol/L   Glucose, Bld 118 (H) 65 - 99 mg/dL   BUN 29 (H) 6 - 20 mg/dL   Creatinine, Ser 1.16 (H) 0.44 - 1.00 mg/dL   Calcium 8.3 (L) 8.9 - 10.3 mg/dL   GFR calc non Af Amer 40 (L) >60 mL/min   GFR calc Af Amer 46 (L) >60 mL/min    Comment: (NOTE) The eGFR has been calculated using the CKD EPI equation. This calculation has not been validated in all clinical situations. eGFR's persistently <60 mL/min signify possible Chronic Kidney Disease.    Anion gap 9 5 - 15  Basic metabolic panel      Status: Abnormal   Collection Time: 09/30/16  6:34 AM  Result Value Ref Range   Sodium 129 (L) 135 - 145 mmol/L   Potassium 4.3 3.5 - 5.1 mmol/L   Chloride 93 (L) 101 - 111 mmol/L   CO2 27 22 - 32 mmol/L   Glucose, Bld 104 (H) 65 - 99 mg/dL   BUN 28 (H) 6 - 20 mg/dL   Creatinine, Ser 1.17 (H) 0.44 - 1.00 mg/dL   Calcium 8.7 (L) 8.9 - 10.3 mg/dL   GFR calc non Af Amer 39 (L) >60 mL/min   GFR calc Af Amer 46 (L) >60 mL/min    Comment: (NOTE) The eGFR has been calculated using the CKD EPI equation. This calculation has not been validated in all clinical situations. eGFR's persistently <60 mL/min signify possible Chronic Kidney Disease.    Anion gap 9 5 - 15    ABGS No results for input(s): PHART, PO2ART, TCO2, HCO3 in the last 72 hours.  Invalid input(s): PCO2 CULTURES Recent Results (from the past 240 hour(s))  Culture, blood (routine x 2) Call MD if unable to obtain prior to antibiotics being given     Status: None   Collection Time: 09/23/16 12:31 PM  Result Value Ref Range Status   Specimen Description LEFT ANTECUBITAL  Final   Special Requests Blood Culture adequate  volume  Final   Culture NO GROWTH 5 DAYS  Final   Report Status 09/28/2016 FINAL  Final  Culture, blood (routine x 2) Call MD if unable to obtain prior to antibiotics being given     Status: None   Collection Time: 09/23/16 12:37 PM  Result Value Ref Range Status   Specimen Description LEFT ANTECUBITAL  Final   Special Requests Blood Culture adequate volume  Final   Culture NO GROWTH 5 DAYS  Final   Report Status 09/28/2016 FINAL  Final  Culture, sputum-assessment     Status: None   Collection Time: 09/24/16  8:00 AM  Result Value Ref Range Status   Specimen Description EXPECTORATED SPUTUM  Final   Special Requests NONE  Final   Sputum evaluation   Final    THIS SPECIMEN IS ACCEPTABLE FOR SPUTUM CULTURE Performed at [facility]    Report Status 09/24/2016 FINAL  Final  Culture, respiratory  (NON-Expectorated)     Status: None   Collection Time: 09/24/16  8:00 AM  Result Value Ref Range Status   Specimen Description EXPECTORATED SPUTUM  Final   Special Requests NONE Reflexed from [id number]  Final   Gram Stain   Final    RARE WBC PRESENT, PREDOMINANTLY PMN MODERATE GRAM POSITIVE COCCI IN CLUSTERS Performed at [facility], [address] [place], [place] [address]    Culture   Final    ABUNDANT METHICILLIN RESISTANT STAPHYLOCOCCUS AUREUS   Report Status 09/26/2016 FINAL  Final   Organism ID, Bacteria METHICILLIN RESISTANT STAPHYLOCOCCUS AUREUS  Final      Susceptibility   Methicillin resistant staphylococcus aureus - MIC*    CIPROFLOXACIN >=8 RESISTANT Resistant     ERYTHROMYCIN >=8 RESISTANT Resistant     GENTAMICIN <=0.5 SENSITIVE Sensitive     OXACILLIN >=4 RESISTANT Resistant     TETRACYCLINE <=1 SENSITIVE Sensitive     VANCOMYCIN 1 SENSITIVE Sensitive     TRIMETH/SULFA >=320 RESISTANT Resistant     CLINDAMYCIN <=0.25 SENSITIVE Sensitive     RIFAMPIN <=0.5 SENSITIVE Sensitive     Inducible Clindamycin NEGATIVE Sensitive     * ABUNDANT METHICILLIN RESISTANT STAPHYLOCOCCUS AUREUS   Studies/Results: No results found.  Medications:  Prior to Admission:  Prescriptions Prior to Admission  Medication Sig Dispense Refill Last Dose  . Calcium Citrate-Vitamin D (CALCIUM CITRATE + D PO) Take 1 tablet by mouth daily.   09/22/2016 at Unknown time  . furosemide (LASIX) 20 MG tablet Take 1 tablet (20 mg total) by mouth daily. 90 tablet 3 09/22/2016 at Unknown time  . gabapentin (NEURONTIN) 300 MG capsule Take 300 mg by mouth at bedtime.   09/22/2016 at Unknown time  . hydrochlorothiazide (MICROZIDE) 12.5 MG capsule Take 1 capsule (12.5 mg total) by mouth daily. 90 capsule 3 09/22/2016 at Unknown time  . levothyroxine (SYNTHROID, LEVOTHROID) 50 MCG tablet Take 50 mcg by mouth daily.   09/22/2016 at Unknown time  . Multiple Vitamins-Minerals (PRESERVISION AREDS PO) Take 1 capsule  by mouth daily.   09/22/2016 at Unknown time  . naproxen (NAPROSYN) 500 MG tablet Take 500 mg by mouth 2 (two) times daily as needed for mild pain.    Past Week at Unknown time  . pantoprazole (PROTONIX) 40 MG tablet Take 1 tablet (40 mg total) by mouth 2 (two) times daily before a meal. 60 tablet 3 09/22/2016 at Unknown time  . Polysacchar Iron-FA-B12 (FERREX 150 FORTE) 150-1-25 MG-MG-MCG CAPS Take 1 capsule by mouth daily.  30 capsule 11 09/22/2016 at Unknown time  . potassium chloride SA (K-DUR,KLOR-CON) 20 MEQ tablet Take 1 tablet (20 mEq total) by mouth daily. 90 tablet 3 09/22/2016 at Unknown time  . pravastatin (PRAVACHOL) 40 MG tablet Take 40 mg by mouth daily.  3 09/22/2016 at Unknown time  . traZODone (DESYREL) 50 MG tablet Take 1 tablet (50 mg total) by mouth at bedtime as needed for sleep. 30 tablet 3 Past Week at Unknown time   Scheduled: . bisoprolol  5 mg Oral Daily  . furosemide  20 mg Oral Daily  . gabapentin  300 mg Oral QHS  . iron polysaccharides  150 mg Oral Daily  . levothyroxine  50 mcg Oral Daily  . pantoprazole  40 mg Oral BID AC  . pravastatin  40 mg Oral Daily  . ramipril  2.5 mg Oral Daily  . sodium chloride flush  3 mL Intravenous Q12H   Continuous: . sodium chloride    . ampicillin-sulbactam (UNASYN) IV 3 g (09/30/16 0935)  . vancomycin 750 mg (09/29/16 1216)   YSD:BNRWKE chloride, acetaminophen, ALPRAZolam, levalbuterol, ondansetron (ZOFRAN) IV, sodium chloride, sodium chloride flush, traZODone  Assesment: She was admitted with aspiration pneumonia and acute on chronic combined systolic and diastolic heart failure. She had improved fairly markedly but had a lot more trouble yesterday. Her breathing issues I think were exacerbated by anxiety. Principal Problem:   Aspiration pneumonia ([place]) Active Problems:   Hypothyroidism   Essential hypertension   Hyperlipidemia   Iron deficiency anemia   Acute on chronic combined systolic and diastolic CHF (congestive  heart failure) (HCC)    Plan:I added prednisone. She has Xanax available now. Discussed with her sons at bedside.    LOS: 7 days   Jeffrey Voth L 09/30/2016, 9:46 AM

## 2016-09-30 NOTE — Progress Notes (Signed)
Pt calling for neb tx. Not time for PRN tx yet, but pt feels she is too SOB to wait another hour. Neb tx given. Pt tolerated well. RT will continue to monitor.

## 2016-09-30 NOTE — Progress Notes (Signed)
Subjective: She experienced increased difficulty breathing yesterday. She required additional breathing treatments. She became quite anxious and felt much better after taking low-dose Xanax. She complains of feeling some increased congestion today. Objective: Vital signs in last 24 hours: Vitals:   09/29/16 2156 09/30/16 0029 09/30/16 0650 09/30/16 1002  BP: (!) 130/96  137/87   Pulse: 73  82   Resp: 18  18   Temp: 98.6 F (37 C)  98.9 F (37.2 C)   TempSrc: Oral  Oral   SpO2: 96% 96% 97% 98%  Weight:      Height:       Weight change:   Intake/Output Summary (Last 24 hours) at 09/30/16 1019 Last data filed at 09/29/16 1200  Gross per 24 hour  Intake              360 ml  Output                0 ml  Net              360 ml    Physical Exam: Alert. Mildly dyspneic. Lungs reveal mild rhonchi. Heart regular with no murmurs. Abdomen soft and nontender. Extremities reveal no edema.  Lab Results:    Results for orders placed or performed during the hospital encounter of 09/23/16 (from the past 24 hour(s))  Basic metabolic panel     Status: Abnormal   Collection Time: 09/30/16  6:34 AM  Result Value Ref Range   Sodium 129 (L) 135 - 145 mmol/L   Potassium 4.3 3.5 - 5.1 mmol/L   Chloride 93 (L) 101 - 111 mmol/L   CO2 27 22 - 32 mmol/L   Glucose, Bld 104 (H) 65 - 99 mg/dL   BUN 28 (H) 6 - 20 mg/dL   Creatinine, Ser 1.17 (H) 0.44 - 1.00 mg/dL   Calcium 8.7 (L) 8.9 - 10.3 mg/dL   GFR calc non Af Amer 39 (L) >60 mL/min   GFR calc Af Amer 46 (L) >60 mL/min   Anion gap 9 5 - 15     ABGS No results for input(s): PHART, PO2ART, TCO2, HCO3 in the last 72 hours.  Invalid input(s): PCO2 CULTURES Recent Results (from the past 240 hour(s))  Culture, blood (routine x 2) Call MD if unable to obtain prior to antibiotics being given     Status: None   Collection Time: 09/23/16 12:31 PM  Result Value Ref Range Status   Specimen Description LEFT ANTECUBITAL  Final   Special Requests  Blood Culture adequate volume  Final   Culture NO GROWTH 5 DAYS  Final   Report Status 09/28/2016 FINAL  Final  Culture, blood (routine x 2) Call MD if unable to obtain prior to antibiotics being given     Status: None   Collection Time: 09/23/16 12:37 PM  Result Value Ref Range Status   Specimen Description LEFT ANTECUBITAL  Final   Special Requests Blood Culture adequate volume  Final   Culture NO GROWTH 5 DAYS  Final   Report Status 09/28/2016 FINAL  Final  Culture, sputum-assessment     Status: None   Collection Time: 09/24/16  8:00 AM  Result Value Ref Range Status   Specimen Description EXPECTORATED SPUTUM  Final   Special Requests NONE  Final   Sputum evaluation   Final    THIS SPECIMEN IS ACCEPTABLE FOR SPUTUM CULTURE Performed at [facility]    Report Status 09/24/2016 FINAL  Final  Culture, respiratory (  NON-Expectorated)     Status: None   Collection Time: 09/24/16  8:00 AM  Result Value Ref Range Status   Specimen Description EXPECTORATED SPUTUM  Final   Special Requests NONE Reflexed from [phone] [id number]  Final   Gram Stain   Final    RARE WBC PRESENT, PREDOMINANTLY PMN MODERATE GRAM POSITIVE COCCI IN CLUSTERS Performed at [facility], [address] [place], [place] [address]    Culture   Final    ABUNDANT METHICILLIN RESISTANT STAPHYLOCOCCUS AUREUS   Report Status 09/26/2016 FINAL  Final   Organism ID, Bacteria METHICILLIN RESISTANT STAPHYLOCOCCUS AUREUS  Final      Susceptibility   Methicillin resistant staphylococcus aureus - MIC*    CIPROFLOXACIN >=8 RESISTANT Resistant     ERYTHROMYCIN >=8 RESISTANT Resistant     GENTAMICIN <=0.5 SENSITIVE Sensitive     OXACILLIN >=4 RESISTANT Resistant     TETRACYCLINE <=1 SENSITIVE Sensitive     VANCOMYCIN 1 SENSITIVE Sensitive     TRIMETH/SULFA >=320 RESISTANT Resistant     CLINDAMYCIN <=0.25 SENSITIVE Sensitive     RIFAMPIN <=0.5 SENSITIVE Sensitive     Inducible Clindamycin NEGATIVE Sensitive     * ABUNDANT  METHICILLIN RESISTANT STAPHYLOCOCCUS AUREUS   Studies/Results: No results found. Micro Results: Recent Results (from the past 240 hour(s))  Culture, blood (routine x 2) Call MD if unable to obtain prior to antibiotics being given     Status: None   Collection Time: 09/23/16 12:31 PM  Result Value Ref Range Status   Specimen Description LEFT ANTECUBITAL  Final   Special Requests Blood Culture adequate volume  Final   Culture NO GROWTH 5 DAYS  Final   Report Status 09/28/2016 FINAL  Final  Culture, blood (routine x 2) Call MD if unable to obtain prior to antibiotics being given     Status: None   Collection Time: 09/23/16 12:37 PM  Result Value Ref Range Status   Specimen Description LEFT ANTECUBITAL  Final   Special Requests Blood Culture adequate volume  Final   Culture NO GROWTH 5 DAYS  Final   Report Status 09/28/2016 FINAL  Final  Culture, sputum-assessment     Status: None   Collection Time: 09/24/16  8:00 AM  Result Value Ref Range Status   Specimen Description EXPECTORATED SPUTUM  Final   Special Requests NONE  Final   Sputum evaluation   Final    THIS SPECIMEN IS ACCEPTABLE FOR SPUTUM CULTURE Performed at [facility]    Report Status 09/24/2016 FINAL  Final  Culture, respiratory (NON-Expectorated)     Status: None   Collection Time: 09/24/16  8:00 AM  Result Value Ref Range Status   Specimen Description EXPECTORATED SPUTUM  Final   Special Requests NONE Reflexed from [id number]  Final   Gram Stain   Final    RARE WBC PRESENT, PREDOMINANTLY PMN MODERATE GRAM POSITIVE COCCI IN CLUSTERS Performed at [facility], [address] [place], [place] [address]    Culture   Final    ABUNDANT METHICILLIN RESISTANT STAPHYLOCOCCUS AUREUS   Report Status 09/26/2016 FINAL  Final   Organism ID, Bacteria METHICILLIN RESISTANT STAPHYLOCOCCUS AUREUS  Final      Susceptibility   Methicillin resistant staphylococcus aureus - MIC*    CIPROFLOXACIN >=8 RESISTANT Resistant      ERYTHROMYCIN >=8 RESISTANT Resistant     GENTAMICIN <=0.5 SENSITIVE Sensitive     OXACILLIN >=4 RESISTANT Resistant     TETRACYCLINE <=1 SENSITIVE  Sensitive     VANCOMYCIN 1 SENSITIVE Sensitive     TRIMETH/SULFA >=320 RESISTANT Resistant     CLINDAMYCIN <=0.25 SENSITIVE Sensitive     RIFAMPIN <=0.5 SENSITIVE Sensitive     Inducible Clindamycin NEGATIVE Sensitive     * ABUNDANT METHICILLIN RESISTANT STAPHYLOCOCCUS AUREUS   Studies/Results: No results found. Medications:  I have reviewed the patient's current medications Scheduled Meds: . bisoprolol  5 mg Oral Daily  . feeding supplement (ENSURE ENLIVE)  237 mL Oral BID BM  . furosemide  20 mg Oral Daily  . gabapentin  300 mg Oral QHS  . iron polysaccharides  150 mg Oral Daily  . levothyroxine  50 mcg Oral Daily  . pantoprazole  40 mg Oral BID AC  . pravastatin  40 mg Oral Daily  . predniSONE  40 mg Oral Q breakfast  . ramipril  2.5 mg Oral Daily  . sodium chloride flush  3 mL Intravenous Q12H   Continuous Infusions: . sodium chloride    . ampicillin-sulbactam (UNASYN) IV 3 g (09/30/16 0935)  . vancomycin 750 mg (09/29/16 1216)   PRN Meds:.sodium chloride, acetaminophen, ALPRAZolam, levalbuterol, ondansetron (ZOFRAN) IV, sodium chloride, sodium chloride flush, traZODone   Assessment/Plan: #1. Pneumonia. Continue vancomycin and Unasyn. #2. CHF. Diastolic/systolic. Acute on chronic. Lasix has been modified to oral use. #3. Chronic anemia. Stable. #4. Anxiety. Xanax if needed. Principal Problem:   Aspiration pneumonia ([place]) Active Problems:   Hypothyroidism   Essential hypertension   Hyperlipidemia   Iron deficiency anemia   Acute on chronic combined systolic and diastolic CHF (congestive heart failure) ([place])     LOS: 7 days   Katelyn Daniel 09/30/2016, 10:19 AM

## 2016-09-30 NOTE — Progress Notes (Signed)
RT called to give PRN tx. Pt sitting on side of bed, asked her how her breathing was today and she said "it was better". I asked pt if she was feeling SOB, she said "on and off". I asked if pt was feeling SOB at this time, she said "no". Pt has clear bbs, no distress noted. PRN given to pt anyway for comfort. RT will continue to monitor.

## 2016-10-01 MED ORDER — CLINDAMYCIN HCL 150 MG PO CAPS
150.0000 mg | ORAL_CAPSULE | Freq: Three times a day (TID) | ORAL | Status: DC
Start: 2016-10-01 — End: 2016-10-03
  Administered 2016-10-01 – 2016-10-03 (×6): 150 mg via ORAL
  Filled 2016-10-01 (×6): qty 1

## 2016-10-01 MED ORDER — LEVALBUTEROL HCL 0.63 MG/3ML IN NEBU
0.6300 mg | INHALATION_SOLUTION | Freq: Two times a day (BID) | RESPIRATORY_TRACT | Status: DC
  Administered 2016-10-02 – 2016-10-03 (×3): 0.63 mg via RESPIRATORY_TRACT
  Filled 2016-10-01 (×3): qty 3

## 2016-10-01 NOTE — Progress Notes (Signed)
Subjective: She has noted some additional congestion and has taken some additional nebulizers. Room air oxygen saturation now is 96%. She has felt "woozy" when getting up to go to the bathroom today.  Objective: Vital signs in last 24 hours: Vitals:   09/30/16 1518 09/30/16 2122 09/30/16 2146 10/01/16 0700  BP:  136/88  138/89  Pulse:  79  76  Resp:  20  20  Temp:  98 F (36.7 C)  98.3 F (36.8 C)  TempSrc:  Oral  Oral  SpO2: 100% 99% 95% 100%  Weight:    138 lb 4.8 oz (62.7 kg)  Height:       Weight change:   Intake/Output Summary (Last 24 hours) at 10/01/16 1011 Last data filed at 09/30/16 1800  Gross per 24 hour  Intake              600 ml  Output                0 ml  Net              600 ml    Physical Exam: Alert. She is not anxious at present. Lungs reveal mild rhonchi. Heart regular with no murmurs. Abdomen soft and nontender. Extremities reveal no edema.  Lab Results:   No results found for this or any previous visit (from the past 24 hour(s)).   ABGS No results for input(s): PHART, PO2ART, TCO2, HCO3 in the last 72 hours.  Invalid input(s): PCO2 CULTURES Recent Results (from the past 240 hour(s))  Culture, blood (routine x 2) Call MD if unable to obtain prior to antibiotics being given     Status: None   Collection Time: 09/23/16 12:31 PM  Result Value Ref Range Status   Specimen Description LEFT ANTECUBITAL  Final   Special Requests Blood Culture adequate volume  Final   Culture NO GROWTH 5 DAYS  Final   Report Status 09/28/2016 FINAL  Final  Culture, blood (routine x 2) Call MD if unable to obtain prior to antibiotics being given     Status: None   Collection Time: 09/23/16 12:37 PM  Result Value Ref Range Status   Specimen Description LEFT ANTECUBITAL  Final   Special Requests Blood Culture adequate volume  Final   Culture NO GROWTH 5 DAYS  Final   Report Status 09/28/2016 FINAL  Final  Culture, sputum-assessment     Status: None   Collection Time:  09/24/16  8:00 AM  Result Value Ref Range Status   Specimen Description EXPECTORATED SPUTUM  Final   Special Requests NONE  Final   Sputum evaluation   Final    THIS SPECIMEN IS ACCEPTABLE FOR SPUTUM CULTURE Performed at [facility]    Report Status 09/24/2016 FINAL  Final  Culture, respiratory (NON-Expectorated)     Status: None   Collection Time: 09/24/16  8:00 AM  Result Value Ref Range Status   Specimen Description EXPECTORATED SPUTUM  Final   Special Requests NONE Reflexed from [id number]  Final   Gram Stain   Final    RARE WBC PRESENT, PREDOMINANTLY PMN MODERATE GRAM POSITIVE COCCI IN CLUSTERS Performed at [facility], [address] [place], [place] [address]    Culture   Final    ABUNDANT METHICILLIN RESISTANT STAPHYLOCOCCUS AUREUS   Report Status 09/26/2016 FINAL  Final   Organism ID, Bacteria METHICILLIN RESISTANT STAPHYLOCOCCUS AUREUS  Final      Susceptibility   Methicillin resistant staphylococcus aureus -  MIC*    CIPROFLOXACIN >=8 RESISTANT Resistant     ERYTHROMYCIN >=8 RESISTANT Resistant     GENTAMICIN <=0.5 SENSITIVE Sensitive     OXACILLIN >=4 RESISTANT Resistant     TETRACYCLINE <=1 SENSITIVE Sensitive     VANCOMYCIN 1 SENSITIVE Sensitive     TRIMETH/SULFA >=320 RESISTANT Resistant     CLINDAMYCIN <=0.25 SENSITIVE Sensitive     RIFAMPIN <=0.5 SENSITIVE Sensitive     Inducible Clindamycin NEGATIVE Sensitive     * ABUNDANT METHICILLIN RESISTANT STAPHYLOCOCCUS AUREUS   Studies/Results: No results found. Micro Results: Recent Results (from the past 240 hour(s))  Culture, blood (routine x 2) Call MD if unable to obtain prior to antibiotics being given     Status: None   Collection Time: 09/23/16 12:31 PM  Result Value Ref Range Status   Specimen Description LEFT ANTECUBITAL  Final   Special Requests Blood Culture adequate volume  Final   Culture NO GROWTH 5 DAYS  Final   Report Status 09/28/2016 FINAL  Final  Culture, blood (routine x 2) Call  MD if unable to obtain prior to antibiotics being given     Status: None   Collection Time: 09/23/16 12:37 PM  Result Value Ref Range Status   Specimen Description LEFT ANTECUBITAL  Final   Special Requests Blood Culture adequate volume  Final   Culture NO GROWTH 5 DAYS  Final   Report Status 09/28/2016 FINAL  Final  Culture, sputum-assessment     Status: None   Collection Time: 09/24/16  8:00 AM  Result Value Ref Range Status   Specimen Description EXPECTORATED SPUTUM  Final   Special Requests NONE  Final   Sputum evaluation   Final    THIS SPECIMEN IS ACCEPTABLE FOR SPUTUM CULTURE Performed at [facility]    Report Status 09/24/2016 FINAL  Final  Culture, respiratory (NON-Expectorated)     Status: None   Collection Time: 09/24/16  8:00 AM  Result Value Ref Range Status   Specimen Description EXPECTORATED SPUTUM  Final   Special Requests NONE Reflexed from [id number]  Final   Gram Stain   Final    RARE WBC PRESENT, PREDOMINANTLY PMN MODERATE GRAM POSITIVE COCCI IN CLUSTERS Performed at [facility], [address], [place], [place] [address]    Culture   Final    ABUNDANT METHICILLIN RESISTANT STAPHYLOCOCCUS AUREUS   Report Status 09/26/2016 FINAL  Final   Organism ID, Bacteria METHICILLIN RESISTANT STAPHYLOCOCCUS AUREUS  Final      Susceptibility   Methicillin resistant staphylococcus aureus - MIC*    CIPROFLOXACIN >=8 RESISTANT Resistant     ERYTHROMYCIN >=8 RESISTANT Resistant     GENTAMICIN <=0.5 SENSITIVE Sensitive     OXACILLIN >=4 RESISTANT Resistant     TETRACYCLINE <=1 SENSITIVE Sensitive     VANCOMYCIN 1 SENSITIVE Sensitive     TRIMETH/SULFA >=320 RESISTANT Resistant     CLINDAMYCIN <=0.25 SENSITIVE Sensitive     RIFAMPIN <=0.5 SENSITIVE Sensitive     Inducible Clindamycin NEGATIVE Sensitive     * ABUNDANT METHICILLIN RESISTANT STAPHYLOCOCCUS AUREUS   Studies/Results: No results found. Medications:  I have reviewed the patient's current  medications Scheduled Meds: . bisoprolol  5 mg Oral Daily  . clindamycin  150 mg Oral Q8H  . feeding supplement (ENSURE ENLIVE)  237 mL Oral BID BM  . furosemide  20 mg Oral Daily  . gabapentin  300 mg Oral QHS  . iron polysaccharides  150 mg Oral Daily  .  levothyroxine  50 mcg Oral Daily  . pantoprazole  40 mg Oral BID AC  . pravastatin  40 mg Oral Daily  . predniSONE  40 mg Oral Q breakfast  . ramipril  2.5 mg Oral Daily  . sodium chloride flush  3 mL Intravenous Q12H   Continuous Infusions: . sodium chloride     PRN Meds:.sodium chloride, acetaminophen, ALPRAZolam, levalbuterol, ondansetron (ZOFRAN) IV, sodium chloride, sodium chloride flush, traZODone   Assessment/Plan: #1. MRSA pneumonia. Switch antibiotics to clindamycin orally. #2. Systolic/diastolic heart failure. Continue Lasix.  Will have her get up and out of bed more today and ambulate with assistance. Oxygen has been discontinued this morning Principal Problem:   Aspiration pneumonia (HCC) Active Problems:   Hypothyroidism   Essential hypertension   Hyperlipidemia   Iron deficiency anemia   Acute on chronic combined systolic and diastolic CHF (congestive heart failure) ([place])     LOS: 8 days   Lizzete Gough 10/01/2016, 10:11 AM

## 2016-10-01 NOTE — Progress Notes (Signed)
Sextremities normal, atraumatic, no cyanosis or edemaubjective: She says she feels better. She is however having some dizziness. She wants to know she's going to need oxygen at home so that will be checked. She is still coughing some.  Objective: Vital signs in last 24 hours: Temp:  [97.6 F (36.4 C)-98.3 F (36.8 C)] 98.3 F (36.8 C) (04/29 0700) Pulse Rate:  [69-79] 76 (04/29 0700) Resp:  [19-20] 20 (04/29 0700) BP: (113-138)/(88-97) 138/89 (04/29 0700) SpO2:  [95 %-100 %] 100 % (04/29 0700) Weight:  [62.7 kg (138 lb 4.8 oz)] 62.7 kg (138 lb 4.8 oz) (04/29 0700) Weight change:  Last BM Date: 09/30/16  Intake/Output from previous day: 04/28 0701 - 04/29 0700 In: 1150 [P.O.:1150] Out: -   PHYSICAL EXAM General appearance: alert, cooperative and no distress Resp: clear to auscultation bilaterally Cardio: regular rate and rhythm, S1, S2 normal, no murmur, click, rub or gallop GI: soft, non-tender; bowel sounds normal; no masses,  no organomegaly Extremities: extPsychiatric: She is still anxiousities normal, atraumatic, no cyanosis or edema Psychiatric, she is still anxious  Lab Results:  Results for orders placed or performed during the hospital encounter of 09/23/16 (from the past 48 hour(s))  Basic metabolic panel     Status: Abnormal   Collection Time: 09/30/16  6:34 AM  Result Value Ref Range   Sodium 129 (L) 135 - 145 mmol/L   Potassium 4.3 3.5 - 5.1 mmol/L   Chloride 93 (L) 101 - 111 mmol/L   CO2 27 22 - 32 mmol/L   Glucose, Bld 104 (H) 65 - 99 mg/dL   BUN 28 (H) 6 - 20 mg/dL   Creatinine, Ser 1.17 (H) 0.44 - 1.00 mg/dL   Calcium 8.7 (L) 8.9 - 10.3 mg/dL   GFR calc non Af Amer 39 (L) >60 mL/min   GFR calc Af Amer 46 (L) >60 mL/min    Comment: (NOTE) The eGFR has been calculated using the CKD EPI equation. This calculation has not been validated in all clinical situations. eGFR's persistently <60 mL/min signify possible Chronic Kidney Disease.    Anion gap 9 5 -  15    ABGS No results for input(s): PHART, PO2ART, TCO2, HCO3 in the last 72 hours.  Invalid input(s): PCO2 CULTURES Recent Results (from the past 240 hour(s))  Culture, blood (routine x 2) Call MD if unable to obtain prior to antibiotics being given     Status: None   Collection Time: 09/23/16 12:31 PM  Result Value Ref Range Status   Specimen Description LEFT ANTECUBITAL  Final   Special Requests Blood Culture adequate volume  Final   Culture NO GROWTH 5 DAYS  Final   Report Status 09/28/2016 FINAL  Final  Culture, blood (routine x 2) Call MD if unable to obtain prior to antibiotics being given     Status: None   Collection Time: 09/23/16 12:37 PM  Result Value Ref Range Status   Specimen Description LEFT ANTECUBITAL  Final   Special Requests Blood Culture adequate volume  Final   Culture NO GROWTH 5 DAYS  Final   Report Status 09/28/2016 FINAL  Final  Culture, sputum-assessment     Status: None   Collection Time: 09/24/16  8:00 AM  Result Value Ref Range Status   Specimen Description EXPECTORATED SPUTUM  Final   Special Requests NONE  Final   Sputum evaluation   Final    THIS SPECIMEN IS ACCEPTABLE FOR SPUTUM CULTURE Performed at [facility] : [facility]  Report Status 09/24/2016 FINAL  Final  Culture, respiratory (NON-Expectorated)     Status: None   Collection Time: 09/24/16  8:00 AM  Result Value Ref Range Status   Specimen Description EXPECTORATED SPUTUM  Final   Special Requests NONE Reflexed from [phone] [id number]  Final   Gram Stain   Final    RARE WBC PRESENT, PREDOMINANTLY PMN MODERATE GRAM POSITIVE COCCI IN CLUSTERS Performed at [facility], [address] [place], [place] [address]    Culture   Final    ABUNDANT METHICILLIN RESISTANT STAPHYLOCOCCUS AUREUS   Report Status 09/26/2016 FINAL  Final   Organism ID, Bacteria METHICILLIN RESISTANT STAPHYLOCOCCUS AUREUS  Final      Susceptibility   Methicillin resistant staphylococcus aureus - MIC*    CIPROFLOXACIN >=8  RESISTANT Resistant     ERYTHROMYCIN >=8 RESISTANT Resistant     GENTAMICIN <=0.5 SENSITIVE Sensitive     OXACILLIN >=4 RESISTANT Resistant     TETRACYCLINE <=1 SENSITIVE Sensitive     VANCOMYCIN 1 SENSITIVE Sensitive     TRIMETH/SULFA >=320 RESISTANT Resistant     CLINDAMYCIN <=0.25 SENSITIVE Sensitive     RIFAMPIN <=0.5 SENSITIVE Sensitive     Inducible Clindamycin NEGATIVE Sensitive     * ABUNDANT METHICILLIN RESISTANT STAPHYLOCOCCUS AUREUS   Studies/Results: No results found.  Medications:  Prior to Admission:  Prescriptions Prior to Admission  Medication Sig Dispense Refill Last Dose  . Calcium Citrate-Vitamin D (CALCIUM CITRATE + D PO) Take 1 tablet by mouth daily.   09/22/2016 at Unknown time  . furosemide (LASIX) 20 MG tablet Take 1 tablet (20 mg total) by mouth daily. 90 tablet 3 09/22/2016 at Unknown time  . gabapentin (NEURONTIN) 300 MG capsule Take 300 mg by mouth at bedtime.   09/22/2016 at Unknown time  . hydrochlorothiazide (MICROZIDE) 12.5 MG capsule Take 1 capsule (12.5 mg total) by mouth daily. 90 capsule 3 09/22/2016 at Unknown time  . levothyroxine (SYNTHROID, LEVOTHROID) 50 MCG tablet Take 50 mcg by mouth daily.   09/22/2016 at Unknown time  . Multiple Vitamins-Minerals (PRESERVISION AREDS PO) Take 1 capsule by mouth daily.   09/22/2016 at Unknown time  . naproxen (NAPROSYN) 500 MG tablet Take 500 mg by mouth 2 (two) times daily as needed for mild pain.    Past Week at Unknown time  . pantoprazole (PROTONIX) 40 MG tablet Take 1 tablet (40 mg total) by mouth 2 (two) times daily before a meal. 60 tablet 3 09/22/2016 at Unknown time  . Polysacchar Iron-FA-B12 (FERREX 150 FORTE) 150-1-25 MG-MG-MCG CAPS Take 1 capsule by mouth daily. 30 capsule 11 09/22/2016 at Unknown time  . potassium chloride SA (K-DUR,KLOR-CON) 20 MEQ tablet Take 1 tablet (20 mEq total) by mouth daily. 90 tablet 3 09/22/2016 at Unknown time  . pravastatin (PRAVACHOL) 40 MG tablet Take 40 mg by mouth daily.  3  09/22/2016 at Unknown time  . traZODone (DESYREL) 50 MG tablet Take 1 tablet (50 mg total) by mouth at bedtime as needed for sleep. 30 tablet 3 Past Week at Unknown time   Scheduled: . bisoprolol  5 mg Oral Daily  . clindamycin  150 mg Oral Q8H  . feeding supplement (ENSURE ENLIVE)  237 mL Oral BID BM  . furosemide  20 mg Oral Daily  . gabapentin  300 mg Oral QHS  . iron polysaccharides  150 mg Oral Daily  . levothyroxine  50 mcg Oral Daily  . pantoprazole  40 mg Oral BID AC  .  pravastatin  40 mg Oral Daily  . predniSONE  40 mg Oral Q breakfast  . ramipril  2.5 mg Oral Daily  . sodium chloride flush  3 mL Intravenous Q12H   Continuous: . sodium chloride     VWA:QLRJPV chloride, acetaminophen, ALPRAZolam, levalbuterol, ondansetron (ZOFRAN) IV, sodium chloride, sodium chloride flush, traZODone  Assesment: She was admitted with aspiration pneumonia and had MRSA in her sputum. She also had acute on chronic combined systolic and diastolic heart failure. She is significantly better. She's been using oxygen in the hospital and I turned it off to see if she needs oxygen at home. Principal Problem:   Aspiration pneumonia ([place]) Active Problems:   Hypothyroidism   Essential hypertension   Hyperlipidemia   Iron deficiency anemia   Acute on chronic combined systolic and diastolic CHF (congestive heart failure) ([place])    Plan: Continue treatments.    LOS: 8 days   Yonna Alwin L 10/01/2016, 10:31 AM

## 2016-10-02 DIAGNOSIS — E7800 Pure hypercholesterolemia, unspecified: Secondary | ICD-10-CM

## 2016-10-02 DIAGNOSIS — I429 Cardiomyopathy, unspecified: Secondary | ICD-10-CM

## 2016-10-02 LAB — CBC WITH DIFFERENTIAL/PLATELET
Basophils Absolute: 0 10*3/uL (ref 0.0–0.1)
Basophils Relative: 0 %
Eosinophils Absolute: 0 10*3/uL (ref 0.0–0.7)
Eosinophils Relative: 0 %
HEMATOCRIT: 37.9 % (ref 36.0–46.0)
HEMOGLOBIN: 13 g/dL (ref 12.0–15.0)
LYMPHS ABS: 0.7 10*3/uL (ref 0.7–4.0)
LYMPHS PCT: 5 %
MCH: 33.2 pg (ref 26.0–34.0)
MCHC: 34.3 g/dL (ref 30.0–36.0)
MCV: 96.9 fL (ref 78.0–100.0)
MONOS PCT: 3 %
Monocytes Absolute: 0.4 10*3/uL (ref 0.1–1.0)
NEUTROS PCT: 92 %
Neutro Abs: 14.4 10*3/uL — ABNORMAL HIGH (ref 1.7–7.7)
Platelets: 383 10*3/uL (ref 150–400)
RBC: 3.91 MIL/uL (ref 3.87–5.11)
RDW: 14.9 % (ref 11.5–15.5)
WBC: 15.5 10*3/uL — AB (ref 4.0–10.5)

## 2016-10-02 MED ORDER — TUBERCULIN PPD 5 UNIT/0.1ML ID SOLN
5.0000 [IU] | Freq: Once | INTRADERMAL | Status: DC
  Administered 2016-10-02: 5 [IU] via INTRADERMAL
  Filled 2016-10-02: qty 0.1

## 2016-10-02 MED ORDER — SODIUM CHLORIDE 0.9 % IV SOLN
INTRAVENOUS | Status: DC
  Administered 2016-10-02: 22:00:00 via INTRAVENOUS

## 2016-10-02 MED ORDER — FUROSEMIDE 10 MG/ML IJ SOLN
20.0000 mg | Freq: Once | INTRAMUSCULAR | Status: AC
  Administered 2016-10-03: 20 mg via INTRAVENOUS
  Filled 2016-10-02 (×2): qty 2

## 2016-10-02 NOTE — Evaluation (Signed)
Physical Therapy Evaluation Patient Details Name: Katelyn Daniel MRN: [id number] DOB: 1923/07/05 Today's Date: 10/02/2016   History of Present Illness  83 y.o. female with h/o HTN, HLD, hypothyroidism, here today with SOB. This began 1 week ago. She went to urgent care and was given a zpak. Was referred to cardiology earlier in the week, they sent her for a CXR and f/u with PCP; they believed that her aortic insufficiency was not severe enough o be causing her symptoms. Seen by PCP 2 days ago and given neb treatments and prednisone. She comes in today without improvement in her SOB. She states it is especially bad when laying down at nighttime.  Dx: aspiration PNA with MRSA in her sputum.     Clinical Impression  Pt received in bed, and was agreeable to PT evaluation.  She is normally independent with ambulation, and ADL's, and still driving.  During today's PT evaluation, she ambulated 151f, however it is very antalgic with multiple compensation strategies, and several minor LOB for which she used stepping strategy to correct.  Her SpO2 desaturated to 85% while on RA during ambulation.  She is recommended to return home with HHPT, and possibly a RW.      Follow Up Recommendations Home health PT    Equipment Recommendations  Other (comment) (possibly a RW)    Recommendations for Other Services       Precautions / Restrictions Precautions Precautions: Fall Precaution Comments: Due to immobility  Restrictions Weight Bearing Restrictions: No      Mobility  Bed Mobility Overal bed mobility: Modified Independent                Transfers Overall transfer level: Modified independent Equipment used: None                Ambulation/Gait Ambulation/Gait assistance: Min guard Ambulation Distance (Feet): 100 Feet   Gait Pattern/deviations: Step-through pattern;Wide base of support;Antalgic     General Gait Details: Pt demonstrates poor knee mobility, which causes her to  compensate with B hip circumduction, as well as trunk extension during gait.  She demonstrates wide B UE swing, which she holds posterior to her trunk with head forward position.   She demonstrates multiple slight LOB which she corrects via stepping strategy.  She desaturates to 85% while on RA.    Stairs            Wheelchair Mobility    Modified Rankin (Stroke Patients Only)       Balance Overall balance assessment: Needs assistance Sitting-balance support: Feet supported Sitting balance-Leahy Scale: Good     Standing balance support: No upper extremity supported Standing balance-Leahy Scale: Fair                               Pertinent Vitals/Pain Pain Assessment: No/denies pain    Home Living   Living Arrangements: Spouse/significant other (pt is helping to care for her husband.  ) Available Help at Discharge:  (aide that comes in 7 days per week.  ) Type of Home: House Home Access: Stairs to enter;Ramped entrance   Entrance Stairs-Number of Steps: 1 Home Layout: Two level (basement with chair lift. Pt accesses basement every day) Home Equipment: Cane - single point;Shower seat;Walker - 4 wheels;Grab bars - toilet      Prior Function Level of Independence: Independent      ADL's / Homemaking Assistance Needed: independent  Comments: Pt is still  driving, and running errands.       Hand Dominance   Dominant Hand: Right    Extremity/Trunk Assessment   Upper Extremity Assessment Upper Extremity Assessment: Overall WFL for tasks assessed    Lower Extremity Assessment Lower Extremity Assessment: Generalized weakness       Communication   Communication: No difficulties  Cognition Arousal/Alertness: Awake/alert Behavior During Therapy: WFL for tasks assessed/performed Overall Cognitive Status: Within Functional Limits for tasks assessed                                        General Comments      Exercises      Assessment/Plan    PT Assessment Patient needs continued PT services  PT Problem List Decreased strength;Decreased activity tolerance;Decreased balance;Decreased mobility;Decreased safety awareness;Cardiopulmonary status limiting activity       PT Treatment Interventions DME instruction;Gait training;Functional mobility training;Therapeutic activities;Therapeutic exercise;Balance training;Patient/family education    PT Goals (Current goals can be found in the Care Plan section)  Acute Rehab PT Goals Patient Stated Goal: Pt wants to go home.  PT Goal Formulation: With patient/family Time For Goal Achievement: 10/09/16 Potential to Achieve Goals: Good    Frequency Min 3X/week   Barriers to discharge Decreased caregiver support Pt is normally the caregiver for her husband, however, they have an aide 7 days per week.     Co-evaluation               AM-PAC PT "6 Clicks" Daily Activity  Outcome Measure Difficulty turning over in bed (including adjusting bedclothes, sheets and blankets)?: None Difficulty moving from lying on back to sitting on the side of the bed? : None Difficulty sitting down on and standing up from a chair with arms (e.g., wheelchair, bedside commode, etc,.)?: None Help needed moving to and from a bed to chair (including a wheelchair)?: A Little Help needed walking in hospital room?: A Little Help needed climbing 3-5 steps with a railing? : A Little 6 Click Score: 21    End of Session Equipment Utilized During Treatment: Gait belt Activity Tolerance: Patient limited by fatigue Patient left: in bed;with call bell/phone within reach;with family/visitor present Nurse Communication: Mobility status (mobility sheet left hanging in the pts room. ) PT Visit Diagnosis: Muscle weakness (generalized) (M62.81);Other abnormalities of gait and mobility (R26.89)    Time: 7782-4235 PT Time Calculation (min) (ACUTE ONLY): 29 min   Charges:   PT Evaluation $PT  Eval Low Complexity: 1 Procedure PT Treatments $Gait Training: 8-22 mins   PT G Codes:   PT G-Codes **NOT FOR INPATIENT CLASS** Functional Assessment Tool Used: AM-PAC 6 Clicks Basic Mobility;Clinical judgement Functional Limitation: Mobility: Walking and moving around Mobility: Walking and Moving Around Current Status (T6144): At least 20 percent but less than 40 percent impaired, limited or restricted Mobility: Walking and Moving Around Goal Status [phone]): At least 1 percent but less than 20 percent impaired, limited or restricted    Beth Marletta Bousquet, PT, DPT X: [phone]

## 2016-10-02 NOTE — Progress Notes (Signed)
Nutrition Follow-up  INTERVENTION:  Ensure Enlive po BID, each supplement provides 350 kcal and 20 grams of protein   Recommend liberalize diet to Regular   NUTRITION DIAGNOSIS:  Decreased fluid needs related to CHF as evidenced by est needs   GOAL:    Pt to meet >/= 90% of their estimated nutrition needs     MONITOR:  Po intake, labs and wt trends     REASON FOR ASSESSMENT:  Malnutrition Screen      ASSESSMENT: The patient is a pleasant 83 yo who presented with acute on chronic CHF on 4/21. She has hx of hypertension, hyperlipidemia, Iron deficiency and thyroid disease. RD drawn to pt due to nutrition screen re-assessment.  Her appetite is reported as fairly good and meal intake documentation averaging between 50-75% most meals. She is also has Ensure ordered and prefers [employer].  She really dislikes the [place] bacon and would prefer regular diet if MD is in agreement. She wants " just one" BLT.   Her weight is down slightly but expected given her diuretic therapy. Her weight has fluctuated between 62-65 kg since January.     Nutrition-Focused physical exam completed. Findings are mild orbital fat depletion, mild muscle depletion, and no edema. Expect muscle loss related to age and decreased mobility.   Recent Labs Lab 09/28/16 0553 09/29/16 0541 09/30/16 0634  NA 130* 128* 129*  K 4.3 3.9 4.3  CL 96* 95* 93*  CO2 '23 24 27  '$ BUN 26* 29* 28*  CREATININE 1.08* 1.16* 1.17*  CALCIUM 8.4* 8.3* 8.7*  GLUCOSE 145* 118* 104*   Labs: Sodium 129, BUN 28, Cr 1.17   Meds: Lasix,  iron, ensure enlive (strawberry), protonix  Diet Order:  Diet Heart Room service appropriate? Yes; Fluid consistency: Thin  Skin:   intact  Last BM:   4/28   Height:   Ht Readings from Last 1 Encounters:  09/26/16 '5\' 7"'$  (1.702 m)    Weight:   Wt Readings from Last 1 Encounters:  10/02/16 136 lb 7.4 oz (61.9 kg)    Ideal Body Weight:   61 kg  BMI:  Body mass index is 21.37  kg/m.  Estimated Nutritional Needs:   Kcal:   1550-1736 (25-28 kcal/kg)  Protein:   74-80 gr (1.2-1.3 gr/kg)  Fluid:   1.5 liters daily  EDUCATION NEEDS: none at this time    Colman Cater MS,RD,CSG,LDN Office: #[phone] Pager: [phone]

## 2016-10-02 NOTE — Progress Notes (Signed)
LCSW requested to assist with ALF documentation/placement. Met with patient at the bedside, no formal consult. Patient is planning to move into ALF [facility] in the near future. Discussed with [facility] information needed to help with placement. Completed FL2, awaiting dc medications to place on FL2 and signature of MD. Will send over clinicals as well with patient permission.  Patient will first dc home as she waits on her bed at [facility]. LCSW will send information through [facility] for assistance with ALF.  Lane Hacker, MSW Clinical Social Work: [employer] Coverage for :  [phone]

## 2016-10-02 NOTE — Progress Notes (Signed)
Subjective: She still doesn't feel back to baseline. She says she's coughing. She had an episode of shortness of breath last night. No chest pain. She's not coughing anything up.  Objective: Vital signs in last 24 hours: Temp:  [97.6 F (36.4 C)-97.8 F (36.6 C)] 97.6 F (36.4 C) (04/30 0500) Pulse Rate:  [65-73] 65 (04/30 0500) Resp:  [20] 20 (04/30 0500) BP: (103-130)/(68-86) 112/68 (04/30 0500) SpO2:  [95 %-100 %] 97 % (04/30 0500) Weight:  [61.9 kg (136 lb 7.4 oz)] 61.9 kg (136 lb 7.4 oz) (04/30 0500) Weight change: -0.832 kg (-1 lb 13.4 oz) Last BM Date: 09/30/16  Intake/Output from previous day: 04/29 0701 - 04/30 0700 In: 720 [P.O.:720] Out: -   PHYSICAL EXAM General appearance: alert, cooperative and mild distress Resp: rhonchi bilaterally Cardio: regular rate and rhythm, S1, S2 normal, no murmur, click, rub or gallop GI: soft, non-tender; bowel sounds normal; no masses,  no organomegaly Extremities: She has some puffiness of the extremities She appears anxious  Lab Results:  No results found for this or any previous visit (from the past 48 hour(s)).  ABGS No results for input(s): PHART, PO2ART, TCO2, HCO3 in the last 72 hours.  Invalid input(s): PCO2 CULTURES Recent Results (from the past 240 hour(s))  Culture, blood (routine x 2) Call MD if unable to obtain prior to antibiotics being given     Status: None   Collection Time: 09/23/16 12:31 PM  Result Value Ref Range Status   Specimen Description LEFT ANTECUBITAL  Final   Special Requests Blood Culture adequate volume  Final   Culture NO GROWTH 5 DAYS  Final   Report Status 09/28/2016 FINAL  Final  Culture, blood (routine x 2) Call MD if unable to obtain prior to antibiotics being given     Status: None   Collection Time: 09/23/16 12:37 PM  Result Value Ref Range Status   Specimen Description LEFT ANTECUBITAL  Final   Special Requests Blood Culture adequate volume  Final   Culture NO GROWTH 5 DAYS  Final    Report Status 09/28/2016 FINAL  Final  Culture, sputum-assessment     Status: None   Collection Time: 09/24/16  8:00 AM  Result Value Ref Range Status   Specimen Description EXPECTORATED SPUTUM  Final   Special Requests NONE  Final   Sputum evaluation   Final    THIS SPECIMEN IS ACCEPTABLE FOR SPUTUM CULTURE Performed at [facility]    Report Status 09/24/2016 FINAL  Final  Culture, respiratory (NON-Expectorated)     Status: None   Collection Time: 09/24/16  8:00 AM  Result Value Ref Range Status   Specimen Description EXPECTORATED SPUTUM  Final   Special Requests NONE Reflexed from [id number]  Final   Gram Stain   Final    RARE WBC PRESENT, PREDOMINANTLY PMN MODERATE GRAM POSITIVE COCCI IN CLUSTERS Performed at [facility], [address] [place], [place] [address]    Culture   Final    ABUNDANT METHICILLIN RESISTANT STAPHYLOCOCCUS AUREUS   Report Status 09/26/2016 FINAL  Final   Organism ID, Bacteria METHICILLIN RESISTANT STAPHYLOCOCCUS AUREUS  Final      Susceptibility   Methicillin resistant staphylococcus aureus - MIC*    CIPROFLOXACIN >=8 RESISTANT Resistant     ERYTHROMYCIN >=8 RESISTANT Resistant     GENTAMICIN <=0.5 SENSITIVE Sensitive     OXACILLIN >=4 RESISTANT Resistant     TETRACYCLINE <=1 SENSITIVE Sensitive     VANCOMYCIN 1 SENSITIVE  Sensitive     TRIMETH/SULFA >=320 RESISTANT Resistant     CLINDAMYCIN <=0.25 SENSITIVE Sensitive     RIFAMPIN <=0.5 SENSITIVE Sensitive     Inducible Clindamycin NEGATIVE Sensitive     * ABUNDANT METHICILLIN RESISTANT STAPHYLOCOCCUS AUREUS   Studies/Results: No results found.  Medications:  Prior to Admission:  Prescriptions Prior to Admission  Medication Sig Dispense Refill Last Dose  . Calcium Citrate-Vitamin D (CALCIUM CITRATE + D PO) Take 1 tablet by mouth daily.   09/22/2016 at Unknown time  . furosemide (LASIX) 20 MG tablet Take 1 tablet (20 mg total) by mouth daily. 90 tablet 3 09/22/2016 at Unknown time   . gabapentin (NEURONTIN) 300 MG capsule Take 300 mg by mouth at bedtime.   09/22/2016 at Unknown time  . hydrochlorothiazide (MICROZIDE) 12.5 MG capsule Take 1 capsule (12.5 mg total) by mouth daily. 90 capsule 3 09/22/2016 at Unknown time  . levothyroxine (SYNTHROID, LEVOTHROID) 50 MCG tablet Take 50 mcg by mouth daily.   09/22/2016 at Unknown time  . Multiple Vitamins-Minerals (PRESERVISION AREDS PO) Take 1 capsule by mouth daily.   09/22/2016 at Unknown time  . naproxen (NAPROSYN) 500 MG tablet Take 500 mg by mouth 2 (two) times daily as needed for mild pain.    Past Week at Unknown time  . pantoprazole (PROTONIX) 40 MG tablet Take 1 tablet (40 mg total) by mouth 2 (two) times daily before a meal. 60 tablet 3 09/22/2016 at Unknown time  . Polysacchar Iron-FA-B12 (FERREX 150 FORTE) 150-1-25 MG-MG-MCG CAPS Take 1 capsule by mouth daily. 30 capsule 11 09/22/2016 at Unknown time  . potassium chloride SA (K-DUR,KLOR-CON) 20 MEQ tablet Take 1 tablet (20 mEq total) by mouth daily. 90 tablet 3 09/22/2016 at Unknown time  . pravastatin (PRAVACHOL) 40 MG tablet Take 40 mg by mouth daily.  3 09/22/2016 at Unknown time  . traZODone (DESYREL) 50 MG tablet Take 1 tablet (50 mg total) by mouth at bedtime as needed for sleep. 30 tablet 3 Past Week at Unknown time   Scheduled: . bisoprolol  5 mg Oral Daily  . clindamycin  150 mg Oral Q8H  . feeding supplement (ENSURE ENLIVE)  237 mL Oral BID BM  . furosemide  20 mg Intravenous Once  . furosemide  20 mg Oral Daily  . gabapentin  300 mg Oral QHS  . iron polysaccharides  150 mg Oral Daily  . levalbuterol  0.63 mg Nebulization BID  . levothyroxine  50 mcg Oral Daily  . pantoprazole  40 mg Oral BID AC  . pravastatin  40 mg Oral Daily  . predniSONE  40 mg Oral Q breakfast  . ramipril  2.5 mg Oral Daily  . sodium chloride flush  3 mL Intravenous Q12H   Continuous: . sodium chloride     ZSW:FUXNAT chloride, acetaminophen, ALPRAZolam, levalbuterol, ondansetron  (ZOFRAN) IV, sodium chloride, sodium chloride flush, traZODone  Assesment: She was admitted with aspiration pneumonia. She has MRSA in her sputum and that's being treated. She's had acute on chronic combined systolic and diastolic heart failure. She is still having some shortness of breath cough and congestion. Principal Problem:   Aspiration pneumonia ([place]) Active Problems:   Hypothyroidism   Essential hypertension   Hyperlipidemia   Iron deficiency anemia   Acute on chronic combined systolic and diastolic CHF (congestive heart failure) ([place])    Plan: Continue current treatments. When she goes home she'll need a nebulizer. She may need oxygen.    LOS: 9 days  Demani Mcbrien L 10/02/2016, 8:39 AM

## 2016-10-02 NOTE — Progress Notes (Signed)
Still with some mild to moderate rhonchi currently on oral steroids and oral clindamycin 150 3 times a day for anti-Mercer activity appears she will require home O2 and she has a nebulizer machine with Xopenex ordered already. We'll continue physical therapy for strengthening and ambulation Place PPD for a lot of felt to 4 placement in [place] as per family's request apparently there are 2 beds available there for her husband Katelyn Daniel RJJ:[id number] DOB: 1924-03-15 DOA: 09/23/2016 PCP: Katelyn Curet, MD   Physical Exam: Blood pressure 129/73, pulse 65, temperature 97.6 F (36.4 C), temperature source Oral, resp. rate 20, height '5\' 7"'$  (1.702 Daniel), weight 61.9 kg (136 lb 7.4 oz), SpO2 98 %. Lungs show mild to moderate coarse rhonchi mild end expiratory wheeze no rales audible heart regular rhythm 1/6 aortic flow murmur no S3 no heaves thrills or rubs abdomen soft nontender bowel sounds normoactive   Investigations:  Recent Results (from the past 240 hour(s))  Culture, blood (routine x 2) Call MD if unable to obtain prior to antibiotics being given     Status: None   Collection Time: 09/23/16 12:31 PM  Result Value Ref Range Status   Specimen Description LEFT ANTECUBITAL  Final   Special Requests Blood Culture adequate volume  Final   Culture NO GROWTH 5 DAYS  Final   Report Status 09/28/2016 FINAL  Final  Culture, blood (routine x 2) Call MD if unable to obtain prior to antibiotics being given     Status: None   Collection Time: 09/23/16 12:37 PM  Result Value Ref Range Status   Specimen Description LEFT ANTECUBITAL  Final   Special Requests Blood Culture adequate volume  Final   Culture NO GROWTH 5 DAYS  Final   Report Status 09/28/2016 FINAL  Final  Culture, sputum-assessment     Status: None   Collection Time: 09/24/16  8:00 AM  Result Value Ref Range Status   Specimen Description EXPECTORATED SPUTUM  Final   Special Requests NONE  Final   Sputum evaluation   Final   THIS SPECIMEN IS ACCEPTABLE FOR SPUTUM CULTURE Performed at [facility]    Report Status 09/24/2016 FINAL  Final  Culture, respiratory (NON-Expectorated)     Status: None   Collection Time: 09/24/16  8:00 AM  Result Value Ref Range Status   Specimen Description EXPECTORATED SPUTUM  Final   Special Requests NONE Reflexed from [id number]  Final   Gram Stain   Final    RARE WBC PRESENT, PREDOMINANTLY PMN MODERATE GRAM POSITIVE COCCI IN CLUSTERS Performed at [facility], [address], [place], [place] [address]    Culture   Final    ABUNDANT METHICILLIN RESISTANT STAPHYLOCOCCUS AUREUS   Report Status 09/26/2016 FINAL  Final   Organism ID, Bacteria METHICILLIN RESISTANT STAPHYLOCOCCUS AUREUS  Final      Susceptibility   Methicillin resistant staphylococcus aureus - MIC*    CIPROFLOXACIN >=8 RESISTANT Resistant     ERYTHROMYCIN >=8 RESISTANT Resistant     GENTAMICIN <=0.5 SENSITIVE Sensitive     OXACILLIN >=4 RESISTANT Resistant     TETRACYCLINE <=1 SENSITIVE Sensitive     VANCOMYCIN 1 SENSITIVE Sensitive     TRIMETH/SULFA >=320 RESISTANT Resistant     CLINDAMYCIN <=0.25 SENSITIVE Sensitive     RIFAMPIN <=0.5 SENSITIVE Sensitive     Inducible Clindamycin NEGATIVE Sensitive     * ABUNDANT METHICILLIN RESISTANT STAPHYLOCOCCUS AUREUS     Basic Metabolic Panel:  Recent Labs  09/30/16  0634  NA 129*  K 4.3  CL 93*  CO2 27  GLUCOSE 104*  BUN 28*  CREATININE 1.17*  CALCIUM 8.7*   Liver Function Tests: No results for input(s): AST, ALT, ALKPHOS, BILITOT, PROT, ALBUMIN in the last 72 hours.   CBC: No results for input(s): WBC, NEUTROABS, HGB, HCT, MCV, PLT in the last 72 hours.  No results found.    Medications:  Impression: Principal Problem:   Aspiration pneumonia ([place]) Active Problems:   Hypothyroidism   Essential hypertension   Hyperlipidemia   Iron deficiency anemia   Acute on chronic combined systolic and diastolic CHF (congestive heart failure)  (HCC)     Plan:PPD placed today CBC and be met in a.Daniel. change 0.9 normal saline sodium is 129   Consultants: Cardiology and pulmonology   Procedures   Antibiotics: Clindamycin 150 by mouth 3 times a day        Time spent: One hour   LOS: 9 days   Katelyn Daniel   10/02/2016, 12:59 PM

## 2016-10-02 NOTE — Progress Notes (Signed)
Progress Note  Patient Name: Katelyn Daniel Date of Encounter: 10/02/2016  Primary Cardiologist: [name], MD  Subjective    Still coughing a lot, feels weak. Not sure she can go home.   Inpatient Medications    Scheduled Meds: . bisoprolol  5 mg Oral Daily  . clindamycin  150 mg Oral Q8H  . feeding supplement (ENSURE ENLIVE)  237 mL Oral BID BM  . furosemide  20 mg Oral Daily  . gabapentin  300 mg Oral QHS  . iron polysaccharides  150 mg Oral Daily  . levalbuterol  0.63 mg Nebulization BID  . levothyroxine  50 mcg Oral Daily  . pantoprazole  40 mg Oral BID AC  . pravastatin  40 mg Oral Daily  . predniSONE  40 mg Oral Q breakfast  . ramipril  2.5 mg Oral Daily  . sodium chloride flush  3 mL Intravenous Q12H   Continuous Infusions: . sodium chloride     PRN Meds: sodium chloride, acetaminophen, ALPRAZolam, levalbuterol, ondansetron (ZOFRAN) IV, sodium chloride, sodium chloride flush, traZODone   Vital Signs    Vitals:   10/01/16 1527 10/01/16 2100 10/01/16 2141 10/02/16 0500  BP:  130/86  112/68  Pulse:  73  65  Resp:  20  20  Temp:  97.8 F (36.6 C)  97.6 F (36.4 C)  TempSrc:  Oral  Oral  SpO2: 95% 99% 97% 97%  Weight:    136 lb 7.4 oz (61.9 kg)  Height:        Intake/Output Summary (Last 24 hours) at 10/02/16 0739 Last data filed at 10/01/16 1700  Gross per 24 hour  Intake              720 ml  Output                0 ml  Net              720 ml   Filed Weights   09/29/16 0424 10/01/16 0700 10/02/16 0500  Weight: 133 lb 6.4 oz (60.5 kg) 138 lb 4.8 oz (62.7 kg) 136 lb 7.4 oz (61.9 kg)    Telemetry    No telemetry.  Physical Exam   GEN: Elderly woman. No acute distress.   Neck: No JVD Cardiac: IRRR, 1/6 systolic murmurs, rubs, or gallops.  Respiratory: Upper airway congestion, with frequent coughing. Clear in the bases GI: Soft, nontender, non-distended  MS: Mild ankle edema; No deformity.  Labs    Chemistry  Recent Labs Lab  09/28/16 0553 09/29/16 0541 09/30/16 0634  NA 130* 128* 129*  K 4.3 3.9 4.3  CL 96* 95* 93*  CO2 '23 24 27  '$ GLUCOSE 145* 118* 104*  BUN 26* 29* 28*  CREATININE 1.08* 1.16* 1.17*  CALCIUM 8.4* 8.3* 8.7*  GFRNONAA 44* 40* 39*  GFRAA 50* 46* 46*  ANIONGAP '11 9 9     '$ Hematology  Recent Labs Lab 09/26/16 0607  WBC 10.5  RBC 4.53  HGB 14.5  HCT 43.9  MCV 96.9  MCH 32.0  MCHC 33.0  RDW 15.5  PLT 427*    Cardiac Enzymes No results for input(s): TROPONINI in the last 168 hours. No results for input(s): TROPIPOC in the last 168 hours.   BNP No results for input(s): BNP, PROBNP in the last 168 hours.    Radiology    No results found.  Cardiac Studies   Echocardiogram 08/22/2016: Study Conclusions  - Left ventricle: The cavity size was  normal. Wall thickness was increased in a pattern of mild LVH. Systolic function was mildly reduced. The estimated ejection fraction was in the range of 45% to 50%. Diffuse hypokinesis. There is moderate hypokinesis of the basal-midinferolateral myocardium. Doppler parameters are consistent with abnormal left ventricular relaxation (grade 1 diastolic dysfunction). - Aortic valve: Mildly calcified annulus. Trileaflet; mildly calcified leaflets. There was mild regurgitation. Mean gradient (S): 5 mm Hg. - Mitral valve: Calcified annulus. There was mild regurgitation. - Left atrium: The atrium was mildly dilated. - Right atrium: The atrium was at the upper limits of normal in size. - Atrial septum: No defect or patent foramen ovale was identified. - Tricuspid valve: There was mild regurgitation. - Pulmonary arteries: PA peak pressure: 34 mm Hg (S). - Pericardium, extracardiac: A trivial pericardial effusion was identified posterior to the heart.  Impressions:  - Mild LVH with LVEF 45-50% and diffuse hypokinesis, more prominent in the mid to basal inferolateral wall. Grade 1 diastolic dysfunction. Mild  left atrial enlargement. Mildly calcified mitral annulus with mild mitral regurgitation. Sclerotic aortic valve with mild aortic regurgitation. Mild tricuspid regurgitation with PASP estimated 34 mmHg. Trivial posterior pericardial effusion noted.  Patient Profile     83 y.o. female with history of cardiomyopathy, LVEF 45-50% range and mild diastolic dysfunction, aortic valve sclerosis without stenosis, and hypertension. She is currently admitted with combination of fluid overload and also pneumonia.  Assessment & Plan    1. Acute on Chronic Mixed CHF: EF of 45% - 50% on echocardiogram. She has diuresed with weight stabilizing. Weight down from 142 lbs at highest, to 136 lbs. Up a small amount after stopping IV lasix and transitioning to po lasix 20 mg daily. She has some mild ankle edema noted with fee elevated in the bed. Creatinine 1.17. Na 129, CO2 27. Will give one dose of IV lasix 20 mg, to see if this helps. Then continue lasix 20 mg daily.   2. MRSA Pneumonia: Followed by Dr. Luan Pulling. She is now on Vancomycin. Receiving breathing treatments. Coughing continues to be an issue. Causing her to be significantly fatigued.   3. Essential Hypertension: Currently stable, no evidence of hypotension, on ramipril 2.5 mg daily and bisoprolol 5 mg daily.    Signed, Jory Sims, NP 10/02/2016, 7:39 AM     The patient was seen and examined, and I agree with the history, physical exam, assessment and plan as documented above, with modifications as noted below. I have also personally reviewed all relevant documentation, old records, labs, and both radiographic and cardiovascular studies. I have also independently interpreted old and new ECG's. Still coughing. Has several questions about how she developed pneumonia. Weight appears stable. BP is controlled. She had some orthopnea at home as per son. I think she can be maintained on Lasix 20 mg daily given LVEF 45-50%. Continue  bisoprolol and ramipril. No further recommendations. Will sign off.  Kate Sable, MD, [facility]  10/02/2016 10:25 AM

## 2016-10-03 LAB — BASIC METABOLIC PANEL
Anion gap: 11 (ref 5–15)
BUN: 32 mg/dL — AB (ref 6–20)
CHLORIDE: 98 mmol/L — AB (ref 101–111)
CO2: 28 mmol/L (ref 22–32)
Calcium: 9 mg/dL (ref 8.9–10.3)
Creatinine, Ser: 1.1 mg/dL — ABNORMAL HIGH (ref 0.44–1.00)
GFR calc Af Amer: 49 mL/min — ABNORMAL LOW (ref >60)
GFR calc non Af Amer: 43 mL/min — ABNORMAL LOW (ref >60)
Glucose, Bld: 99 mg/dL (ref 65–99)
POTASSIUM: 4.2 mmol/L (ref 3.5–5.1)
SODIUM: 137 mmol/L (ref 135–145)

## 2016-10-03 MED ORDER — RAMIPRIL 2.5 MG PO CAPS: 2.5000 mg | ORAL_CAPSULE | Freq: Every day | ORAL | 1 refills | Status: DC

## 2016-10-03 MED ORDER — PREDNISONE 20 MG PO TABS: 40.0000 mg | ORAL_TABLET | Freq: Every day | ORAL | 0 refills | Status: DC

## 2016-10-03 MED ORDER — BISOPROLOL FUMARATE 5 MG PO TABS: 5.0000 mg | ORAL_TABLET | Freq: Every day | ORAL | 2 refills | Status: AC

## 2016-10-03 MED ORDER — CLINDAMYCIN HCL 150 MG PO CAPS: 150.0000 mg | ORAL_CAPSULE | Freq: Three times a day (TID) | ORAL | 0 refills | Status: DC

## 2016-10-03 MED ORDER — LEVALBUTEROL HCL 0.63 MG/3ML IN NEBU: 0.6300 mg | INHALATION_SOLUTION | Freq: Two times a day (BID) | RESPIRATORY_TRACT | 12 refills | Status: DC

## 2016-10-03 NOTE — Care Management Important Message (Signed)
Important Message  Patient Details  Name: Katelyn Daniel MRN: [id number] Date of Birth: 1923-11-24   Medicare Important Message Given:  Yes    Sherald Barge, RN 10/03/2016, 11:48 AM

## 2016-10-03 NOTE — NC FL2 (Signed)
[place] MEDICAID FL2 LEVEL OF CARE SCREENING TOOL     IDENTIFICATION  Patient Name: Katelyn Daniel Birthdate: 02/15/1924 Sex: female Admission Date (Current Location): 09/23/2016  [facility] and [place] Number:  [employer] and Address:  [place] [address], [place]      Provider Number: [id number]  Attending Physician Name and Address:  Lucia Gaskins, MD  Relative Name and Phone Number:       Current Level of Care: Hospital Recommended Level of Care: [place] Prior Approval Number:    Date Approved/Denied:   PASRR Number:    Discharge Plan: Domiciliary (Rest home)    Current Diagnoses: Patient Active Problem List   Diagnosis Date Noted  . Aspiration pneumonia ([place]) 09/23/2016  . Acute on chronic combined systolic and diastolic CHF (congestive heart failure) ([place]) 09/23/2016  . Iron deficiency anemia   . Candida esophagitis ([place])   . Generalized weakness 06/12/2016  . Symptomatic anemia 06/12/2016  . AKI (acute kidney injury) ([place]) 06/12/2016  . Mild dehydration 06/12/2016  . Thrombocytosis ([place]) 06/12/2016  . Hypothyroidism 06/12/2016  . Essential hypertension 06/12/2016  . Hyperlipidemia 06/12/2016    Orientation RESPIRATION BLADDER Height & Weight     Self  O2 (3L) Continent Weight: 141 lb 14.4 oz (64.4 kg) Height:  '5\' 7"'$  (170.2 cm)  BEHAVIORAL SYMPTOMS/MOOD NEUROLOGICAL BOWEL NUTRITION STATUS      Continent Diet (Heart Healthy)  AMBULATORY STATUS COMMUNICATION OF NEEDS Skin   Supervision Verbally Normal                       Personal Care Assistance Level of Assistance  Bathing, Feeding, Dressing Bathing Assistance: Independent Feeding assistance: Independent Dressing Assistance: Independent     Functional Limitations Info  Sight, Hearing, Speech Sight Info: Adequate Hearing Info: Impaired Speech Info: Adequate    SPECIAL CARE FACTORS FREQUENCY  PT (By licensed PT)     PT  Frequency: 3x/week              Contractures Contractures Info: Not present    Additional Factors Info  Code Status, Allergies, Isolation Precautions Code Status Info: Full Code Allergies Info: NKA     Isolation Precautions Info: on contact in hospital     Current Medications (10/03/2016):  This is the current hospital active medication list Current Facility-Administered Medications  Medication Dose Route Frequency Provider Last Rate Last Dose  . 0.9 %  sodium chloride infusion  250 mL Intravenous PRN Erline Hau, MD      . 0.9 %  sodium chloride infusion   Intravenous Continuous Lucia Gaskins, MD 75 mL/hr at 10/02/16 2133    . acetaminophen (TYLENOL) tablet 650 mg  650 mg Oral Q4H PRN Erline Hau, MD      . ALPRAZolam Duanne Moron) tablet 0.25 mg  0.25 mg Oral Q4H PRN Asencion Noble, MD   0.25 mg at 10/02/16 1853  . bisoprolol (ZEBETA) tablet 5 mg  5 mg Oral Daily Satira Sark, MD   5 mg at 10/03/16 3335  . clindamycin (CLEOCIN) capsule 150 mg  150 mg Oral Q8H Asencion Noble, MD   150 mg at 10/03/16 4562  . feeding supplement (ENSURE ENLIVE) (ENSURE ENLIVE) liquid 237 mL  237 mL Oral BID BM Lucia Gaskins, MD   237 mL at 10/03/16 0829  . furosemide (LASIX) tablet 20 mg  20 mg Oral Daily Lendon Colonel, NP   20  mg at 10/02/16 0855  . gabapentin (NEURONTIN) capsule 300 mg  300 mg Oral QHS Erline Hau, MD   300 mg at 10/02/16 2126  . iron polysaccharides (NIFEREX) capsule 150 mg  150 mg Oral Daily Erline Hau, MD   150 mg at 10/03/16 6568  . levalbuterol (XOPENEX) nebulizer solution 0.63 mg  0.63 mg Nebulization Q6H PRN Lucia Gaskins, MD   0.63 mg at 10/02/16 1446  . levalbuterol (XOPENEX) nebulizer solution 0.63 mg  0.63 mg Nebulization BID Lucia Gaskins, MD   0.63 mg at 10/03/16 1275  . levothyroxine (SYNTHROID, LEVOTHROID) tablet 50 mcg  50 mcg Oral Daily Erline Hau, MD   50 mcg at 10/03/16 [phone]  .  ondansetron (ZOFRAN) injection 4 mg  4 mg Intravenous Q6H PRN Erline Hau, MD      . pantoprazole (PROTONIX) EC tablet 40 mg  40 mg Oral BID AC Estela Leonie Green, MD   40 mg at 10/03/16 1749  . pravastatin (PRAVACHOL) tablet 40 mg  40 mg Oral Daily Erline Hau, MD   40 mg at 10/03/16 4496  . predniSONE (DELTASONE) tablet 40 mg  40 mg Oral Q breakfast Sinda Du, MD   40 mg at 10/03/16 7591  . ramipril (ALTACE) capsule 2.5 mg  2.5 mg Oral Daily Lendon Colonel, NP   2.5 mg at 10/03/16 6384  . sodium chloride (OCEAN) 0.65 % nasal spray 1 spray  1 spray Each Nare QID PRN Jani Gravel, MD   1 spray at 09/25/16 0456  . sodium chloride flush (NS) 0.9 % injection 3 mL  3 mL Intravenous Q12H Erline Hau, MD   3 mL at 10/03/16 0829  . sodium chloride flush (NS) 0.9 % injection 3 mL  3 mL Intravenous PRN Erline Hau, MD      . traZODone (DESYREL) tablet 50 mg  50 mg Oral QHS PRN Erline Hau, MD   50 mg at 10/02/16 2131  . tuberculin injection 5 Units  5 Units Intradermal Once Lucia Gaskins, MD   5 Units at 10/02/16 1536     Discharge Medications: Please see discharge summary for a list of discharge medications.  Relevant Imaging Results:  Relevant Lab Results:   Additional Information SSN:  [id number]  Ihor Gully, LCSW

## 2016-10-03 NOTE — Progress Notes (Signed)
    Durable Medical Equipment        Start     Ordered   10/03/16 1415  For home use only DME Hospital bed  Once    Question Answer Comment  Patient has (list medical condition): CHF   The above medical condition requires: Patient requires the ability to reposition frequently   Head must be elevated greater than: 30 degrees   Bed type Semi-electric      10/03/16 1414   10/03/16 1125  For home use only DME oxygen  Once    Question Answer Comment  Mode or (Route) Nasal cannula   Liters per Minute 3   Frequency Continuous (stationary and portable oxygen unit needed)   Oxygen conserving device Yes   Oxygen delivery system Gas      10/03/16 1124   10/03/16 0813  For home use only DME Walker rolling  Once    Question:  Patient needs a walker to treat with the following condition  Answer:  General weakness   10/03/16 0813   10/03/16 0000  For home use only DME oxygen    Comments:  Alternate treatments tried and found insufficient .  Question Answer Comment  Mode or (Route) Nasal cannula   Liters per Minute 3   Frequency Continuous (stationary and portable oxygen unit needed)   Oxygen conserving device Yes   Oxygen delivery system Gas      10/03/16 1304   10/02/16 0825  For home use only DME Nebulizer machine  Once    Question:  Patient needs a nebulizer to treat with the following condition  Answer:  COPD (chronic obstructive pulmonary disease) ([place])   10/02/16 0825

## 2016-10-03 NOTE — Care Management Note (Addendum)
Case Management Note  Patient Details  Name: TORIANNA JUNIO MRN: [id number] Date of Birth: 1923/09/02   Expected Discharge Date:  09/25/16               Expected Discharge Plan:  [place]  In-House Referral:  Clinical Social Work  Discharge planning Services  CM Consult  Post Acute Care Choice:  Home Health, Durable Medical Equipment Choice offered to:  Patient  DME Arranged:  Oxygen DME Agency:  [place]:  PT [place]:  [place]  Status of Service:  Completed, signed off  If discussed at [place] of Stay Meetings, dates discussed:  10/03/2016  Additional Comments: Pt discharging home today with [facility] services. Family is making arrangements for ALF placement at [facility], arrangements have not been completed for husband and pt will return home until those have been completed. Pt will require supplemental oxygen as alternate treatments (xopennex) tried and found insufficient, and HH PT. Pt would like [facility] for both HH and DME. Romualdo Bolk of [facility] aware of referral and will obtain pt info from chart. Pt/family aware HH has 48hrs to make first visit for [facility]. [facility] rep will deliver DME to pt room prior to DC. Pt has RW at home to use an will not be provided one. Pt has already received her neb machine. DC plan discussed with pt and daughter at bedside. No further needs communicated.   Sherald Barge, RN 10/03/2016, 11:44 AM

## 2016-10-03 NOTE — Final Progress Note (Signed)
Patient discharged with instructions, prescription, and care notes.  Verbalized understanding via teach back.  IV was removed and the site was WNL. Patient voiced no further complaints or concerns at the time of discharge.  Appointments scheduled per instructions.  Patient left the floor via w/c family  And staff in stable condition. 

## 2016-10-03 NOTE — Clinical Social Work Note (Signed)
LCSW notified son, who was at bedside of patient's discharge. Patient plans on returning home then going to [facility].  Patient is being transported by relatives.  Clinical information sent to [facility] via [employer].   LCSW signing off.     Avion Patella, Clydene Pugh, LCSW

## 2016-10-03 NOTE — Progress Notes (Signed)
She says she thinks she is going home today. I will plan to sign off at this point. Thanks for allowing me to see her with you

## 2016-10-03 NOTE — Progress Notes (Signed)
Patient was 99% on 1LNC. Patient does not wear oxygen at home. Patient taken off 02. Will continue to monitor and place back on 02 if needed.

## 2016-10-03 NOTE — Care Management (Signed)
Pt requesting hospital bed. Order placed and Romualdo Bolk, [facility] rep, made aware. Pt requesting bed be delivered to [facility] once pt moved there from home.

## 2016-10-03 NOTE — Progress Notes (Signed)
SATURATION QUALIFICATIONS: (This note is used to comply with regulatory documentation for home oxygen)  Patient Saturations on Room Air at Rest = 89%  Patient Saturations on Room Air while Ambulating = 81-84%  Patient Saturations on 2 Liters of oxygen while Ambulating = 81-84%  Once back in the room the patient was placed on 3 LPM via n/c and her sats were 99-100%    Please briefly explain why patient needs home oxygen:  The patient sats drop during activity.  She states that without the O2 that she does not feel well.

## 2016-10-03 NOTE — Discharge Summary (Signed)
Physician Discharge Summary  Katelyn Daniel AOZ:[id number] DOB: [birth year] DOA: 09/23/2016  PCP: [name], MD  Admit date: 09/23/2016 Discharge date: 10/03/2016   Recommendations for Outpatient Follow-up:  The patient is scheduled follow-up my office within one week's time she is urged as well as a film it to take all medicines including prednisone 40 mg per day for 7 days then 20 mg per day first 8 additional days and likewise to take clindamycin 150 mg 3 times a day for an additional 12 days from day of discharge as well as 3 L nasal O2 was DME alternative treatments have been tried and found insufficient such as flutter valve Lasix etc. she likewise is to take so neck since in her nebulizer 3-4 times per day 0.63 mg the reason we are using Xopenex is that alternative treatments have been tried and found insufficient such as albuterol which gave her tremulousness and DuoNeb. She is to have physical therapy as well as an Therapist, sports for home health care and/or skin assisted living. Compliance with all medicines is imperative  Discharge Diagnoses:  Principal Problem:   Aspiration pneumonia ([place]) Active Problems:   Hypothyroidism   Essential hypertension   Hyperlipidemia   Iron deficiency anemia   Acute on chronic combined systolic and diastolic CHF (congestive heart failure) (HCC)   Discharge Condition: Good  Filed Weights   10/01/16 0700 10/02/16 0500 10/03/16 0700  Weight: 62.7 kg (138 lb 4.8 oz) 61.9 kg (136 lb 7.4 oz) 64.4 kg (141 lb 14.4 oz)    History of present illness:  Patient is a 83 year old white female previously healthy measuring increasing dyspnea over 1 month. She was found admitted with a right lower lobe infiltrate suspicion for aspiration pneumonia she had mild aortic insufficiency on echocardiogram there was volume overload in the pulmonary congestion this was felt to be too combination of systolic and diastolic dysfunction her global ejection fraction was 45-50%  with some inferior wall regional wall motion abnormalities noted by echocardiogram she was seen in consultation by cardiology and pulmonology she is placed on Unasyn IV for which she clinically improved however 2 days prior to discharge she developed MRSA in sputum her chest x-ray showed increasing clarity and resolution however pulmonology decided to treat her with vancomycin for 5 days and then changing to oral clindamycin 150 by mouth 3 times a day for a total of 14 days 12 which which will begin at day of discharge she was hemodynamically stable she had some mild anemia she had some mild altered mental status she had some mild renal impairment all of which revolve resolved prior to discharge.  Hospital Course:  See history of present illness above  Procedures:    Consultations:  Cardiology and pulmonology  Discharge Instructions  Discharge Instructions    Discharge instructions    Complete by:  As directed    Face-to-face encounter (required for Medicare/Medicaid patients)    Complete by:  As directed    I Saturnino Liew M certify that this patient is under my care and that I, or a nurse practitioner or physician's assistant working with me, had a face-to-face encounter that meets the physician face-to-face encounter requirements with this patient on 10/03/2016. The encounter with the patient was in whole, or in part for the following medical condition(s) which is the primary reason for home health care (List medical condition): MRSA Pnemonitis, aspiration Pneumonia / CHF   The encounter with the patient was in whole, or in part, for the  following medical condition, which is the primary reason for home health care:  MRSA Pnemonitis , CHF   I certify that, based on my findings, the following services are medically necessary home health services:   Nursing Physical therapy     Reason for Medically Necessary Home Health Services:  Skilled Nursing- Changes in Medication/Medication Management    My clinical findings support the need for the above services:  Can transfer bed to chair only   Further, I certify that my clinical findings support that this patient is homebound due to:  Shortness of Breath with activity   For home use only DME oxygen    Complete by:  As directed    Alternate treatments tried and found insufficient .   Mode or (Route):  Nasal cannula   Liters per Minute:  3   Frequency:  Continuous (stationary and portable oxygen unit needed)   Oxygen conserving device:  Yes   Oxygen delivery system:  Gas   Home Health    Complete by:  As directed    To provide the following care/treatments:   PT RN        No Known Allergies  Contact information for follow-up providers    Jory Sims, NP Follow up on 10/09/2016.   Specialties:  Nurse Practitioner, Radiology, Cardiology Why:  4 pm  Contact information: [place]  [phone]            Contact information for after-discharge care    Destination    [facility] .   Specialty:  [place] Contact information: [address] [place] [phone]                   The results of significant diagnostics from this hospitalization (including imaging, microbiology, ancillary and laboratory) are listed below for reference.    Significant Diagnostic Studies: Dg Chest 2 View  Result Date: 09/26/2016 CLINICAL DATA:  Shortness of breath, CHF, hypertension EXAM: CHEST  2 VIEW COMPARISON:  09/23/2016 FINDINGS: Enlargement of cardiac silhouette with pulmonary vascular congestion. Atherosclerotic calcification and elongation of thoracic aorta. Bibasilar small pleural effusions and atelectasis. Improved pulmonary infiltrates suspect pulmonary edema. No segmental consolidation or pneumothorax. Underlying emphysematous changes present. Bones demineralized. IMPRESSION: Enlargement of cardiac silhouette with pulmonary vascular  congestion and improved pulmonary infiltrates likely reflecting improved edema. Persistent small bibasilar pleural effusions and atelectasis. Underlying emphysematous changes. Electronically Signed   By: Lavonia Dana M.D.   On: 09/26/2016 09:22   Dg Chest 2 View  Result Date: 09/19/2016 CLINICAL DATA:  Chest congestion and short of breath EXAM: CHEST  2 VIEW COMPARISON:  06/12/2014 FINDINGS: Interval development of moderate bibasilar airspace disease. Small pleural effusions also present. Airspace disease extends into the right middle lobe. There is mild upper lobe airspace disease bilaterally. The heart is enlarged.  Atherosclerotic aorta. Colonic dilatation most compatible with ileus. IMPRESSION: Diffuse bilateral airspace disease with basilar predominance. This could be due to pneumonia. Pulmonary edema also a consideration. Small bilateral pleural effusions are present. Electronically Signed   By: Franchot Gallo M.D.   On: 09/19/2016 19:48   Dg Chest Port 1 View  Result Date: 09/23/2016 CLINICAL DATA:  Pt states she has been having SOB for 1 week. Started getting worse 3 days ago. Was seen at urgent care Saturday and treated with Z-pak and depomedrol. EXAM: PORTABLE CHEST 1 VIEW COMPARISON:  Chest x-rays dated 09/19/2016 and 06/12/2016. Chest CT dated 05/25/2016. FINDINGS:  Increasing opacity at the right lung base. Additional opacity at the left lung base is stable compared to the most recent chest x-ray of 09/19/2016 but increased compared to earlier exams. Mild cardiomegaly is stable. There is central pulmonary vascular congestion and mild bilateral interstitial prominence, presumed interstitial edema on a background of chronic interstitial lung disease. Atherosclerotic changes noted at the aortic arch. IMPRESSION: 1. Increasing opacity at the right lung base. This is suspicious for pneumonia and small parapneumonic effusion. Alternative consideration would be aspiration pneumonitis. 2. Stable opacity  at the left lung base compared to recent exam of 09/19/2016, although increased compared to earlier chest x-ray of 06/12/2016, also suspicious for pneumonia and small pleural effusion. Again, alternative consideration would be aspiration. 3. Cardiomegaly with central pulmonary vascular congestion and bilateral interstitial prominence, presumed interstitial edema, suggesting some degree of CHF/volume overload. 4. Aortic atherosclerosis. Electronically Signed   By: Franki Cabot M.D.   On: 09/23/2016 07:27    Microbiology: Recent Results (from the past 240 hour(s))  Culture, sputum-assessment     Status: None   Collection Time: 09/24/16  8:00 AM  Result Value Ref Range Status   Specimen Description EXPECTORATED SPUTUM  Final   Special Requests NONE  Final   Sputum evaluation   Final    THIS SPECIMEN IS ACCEPTABLE FOR SPUTUM CULTURE Performed at [facility]    Report Status 09/24/2016 FINAL  Final  Culture, respiratory (NON-Expectorated)     Status: None   Collection Time: 09/24/16  8:00 AM  Result Value Ref Range Status   Specimen Description EXPECTORATED SPUTUM  Final   Special Requests NONE Reflexed from [id number]  Final   Gram Stain   Final    RARE WBC PRESENT, PREDOMINANTLY PMN MODERATE GRAM POSITIVE COCCI IN CLUSTERS Performed at [facility], [address], [place], [place] [address]    Culture   Final    ABUNDANT METHICILLIN RESISTANT STAPHYLOCOCCUS AUREUS   Report Status 09/26/2016 FINAL  Final   Organism ID, Bacteria METHICILLIN RESISTANT STAPHYLOCOCCUS AUREUS  Final      Susceptibility   Methicillin resistant staphylococcus aureus - MIC*    CIPROFLOXACIN >=8 RESISTANT Resistant     ERYTHROMYCIN >=8 RESISTANT Resistant     GENTAMICIN <=0.5 SENSITIVE Sensitive     OXACILLIN >=4 RESISTANT Resistant     TETRACYCLINE <=1 SENSITIVE Sensitive     VANCOMYCIN 1 SENSITIVE Sensitive     TRIMETH/SULFA >=320 RESISTANT Resistant     CLINDAMYCIN <=0.25 SENSITIVE Sensitive       RIFAMPIN <=0.5 SENSITIVE Sensitive     Inducible Clindamycin NEGATIVE Sensitive     * ABUNDANT METHICILLIN RESISTANT STAPHYLOCOCCUS AUREUS     Labs: Basic Metabolic Panel:  Recent Labs Lab 09/27/16 0614 09/28/16 0553 09/29/16 0541 09/30/16 0634 10/03/16 0528  NA 133* 130* 128* 129* 137  K 4.5 4.3 3.9 4.3 4.2  CL 98* 96* 95* 93* 98*  CO2 '25 23 24 27 28  '$ GLUCOSE 141* 145* 118* 104* 99  BUN 23* 26* 29* 28* 32*  CREATININE 1.02* 1.08* 1.16* 1.17* 1.10*  CALCIUM 8.5* 8.4* 8.3* 8.7* 9.0   Liver Function Tests: No results for input(s): AST, ALT, ALKPHOS, BILITOT, PROT, ALBUMIN in the last 168 hours. No results for input(s): LIPASE, AMYLASE in the last 168 hours. No results for input(s): AMMONIA in the last 168 hours. CBC:  Recent Labs Lab 10/02/16 1333  WBC 15.5*  NEUTROABS 14.4*  HGB 13.0  HCT 37.9  MCV 96.9  PLT 383   Cardiac Enzymes: No results for input(s): CKTOTAL, CKMB, CKMBINDEX, TROPONINI in the last 168 hours. BNP: BNP (last 3 results)  Recent Labs  09/23/16 0630 09/25/16 0432  BNP 1,211.0* 608.0*    ProBNP (last 3 results) No results for input(s): PROBNP in the last 8760 hours.  CBG: No results for input(s): GLUCAP in the last 168 hours.     Signed:  Renato Spellman Jerilynn Mages  [facility] Pager: [phone] 10/03/2016, 1:06 PM

## 2016-10-08 NOTE — Progress Notes (Signed)
Cardiology Office Note   Date:  10/09/2016   ID:  Katelyn Daniel, DOB [birth year], MRN [id number]  PCP:  Lucia Gaskins, MD  Cardiologist:  [name], MD  Chief Complaint  Patient presents with  . Hospitalization Follow-up  . Congestive Heart Failure      History of Present Illness: Katelyn Daniel is a 83 y.o. female who presents for post hospital follow up after admission for chronic systolic CHF, HTN, and pneumonia. She was diagnosed with iron deficiency anemia, hypertension, and hyperlipidemia. She was treated with abx, steroids, diuretics. Sent home on steroid taper and breathing treatments. She was found to be MRSA positive on sputum.   She comes today feeling better. Now on O2 via [place]. She has improved breathing. She has no excessive coughing. No chest pain or edema.   Past Medical History:  Diagnosis Date  . Hyperlipemia   . Hypertension   . Iron deficiency anemia   . Thyroid disease     Past Surgical History:  Procedure Laterality Date  . COLONOSCOPY N/A 06/14/2016   Procedure: COLONOSCOPY;  Surgeon: [name], MD;  [facility];  Service: Endoscopy;  Laterality: N/A;  . ESOPHAGOGASTRODUODENOSCOPY N/A 06/14/2016   Procedure: ESOPHAGOGASTRODUODENOSCOPY (EGD);  Surgeon: [name], MD;  [facility];  Service: Endoscopy;  Laterality: N/A;  WITH DILATION   . TONSILLECTOMY    . [facility] SINUS       Current Outpatient Prescriptions  Medication Sig Dispense Refill  . bisoprolol (ZEBETA) 5 MG tablet Take 1 tablet (5 mg total) by mouth daily. 30 tablet 2  . Calcium Citrate-Vitamin D (CALCIUM CITRATE + D PO) Take 1 tablet by mouth daily.    . clindamycin (CLEOCIN) 150 MG capsule Take 1 capsule (150 mg total) by mouth every 8 (eight) hours. 36 capsule 0  . furosemide (LASIX) 20 MG tablet Take 1 tablet (20 mg total) by mouth daily. 90 tablet 3  . gabapentin (NEURONTIN) 300 MG capsule Take 300 mg by mouth at bedtime.    . levalbuterol (XOPENEX) 0.63  MG/3ML nebulizer solution Take 3 mLs (0.63 mg total) by nebulization 2 (two) times daily. 3 mL 12  . levothyroxine (SYNTHROID, LEVOTHROID) 50 MCG tablet Take 50 mcg by mouth daily.    . Multiple Vitamins-Minerals (PRESERVISION AREDS PO) Take 1 capsule by mouth daily.    . pantoprazole (PROTONIX) 40 MG tablet Take 1 tablet (40 mg total) by mouth 2 (two) times daily before a meal. 60 tablet 3  . Polysacchar Iron-FA-B12 (FERREX 150 FORTE) 150-1-25 MG-MG-MCG CAPS Take 1 capsule by mouth daily. 30 capsule 11  . potassium chloride SA (K-DUR,KLOR-CON) 20 MEQ tablet Take 1 tablet (20 mEq total) by mouth daily. 90 tablet 3  . pravastatin (PRAVACHOL) 40 MG tablet Take 40 mg by mouth daily.  3  . predniSONE (DELTASONE) 20 MG tablet Take 2 tablets (40 mg total) by mouth daily with breakfast. 11 tablet 0  . ramipril (ALTACE) 2.5 MG capsule Take 1 capsule (2.5 mg total) by mouth daily. 30 capsule 1  . traZODone (DESYREL) 50 MG tablet Take 1 tablet (50 mg total) by mouth at bedtime as needed for sleep. 30 tablet 3   No current facility-administered medications for this visit.     Allergies:   Patient has no known allergies.    Social History:  The patient  reports that she has never smoked. She has never used smokeless tobacco. She reports that she does not drink alcohol or  use drugs.   Family History:  The patient's family history includes Asthma in her mother; Congestive Heart Failure in her mother; Emphysema in her father; Heart Problems in her brother.    ROS: All other systems are reviewed and negative. Unless otherwise mentioned in H&P    PHYSICAL EXAM: VS:  BP 120/78   Pulse (!) 59   Ht 5' 8.5" (1.74 m)   Wt 142 lb (64.4 kg)   SpO2 97%   BMI 21.28 kg/m  , BMI Body mass index is 21.28 kg/m. GEN: Well nourished, well developed, in no acute distress Frail  HEENT: normal  Neck: no JVD, carotid bruits, or masses Cardiac: RRR; no murmurs, rubs, or gallops,no edema  Respiratory: Clear to  auscultation, coughing with deep breathing.  GI: soft, nontender, nondistended, + BS MS: no deformity or atrophy Wearing support hose.  Skin: warm and dry, no rash Neuro:  Strength is diminished.  Psych: euthymic mood, full affect  Recent Labs: 06/12/2016: ALT 13; TSH 3.339 09/24/2016: Magnesium 1.9 09/25/2016: B Natriuretic Peptide 608.0 10/02/2016: Hemoglobin 13.0; Platelets 383 10/03/2016: BUN 32; Creatinine, Ser 1.10; Potassium 4.2; Sodium 137    Lipid Panel No results found for: CHOL, TRIG, HDL, CHOLHDL, VLDL, LDLCALC, LDLDIRECT    Wt Readings from Last 3 Encounters:  10/09/16 142 lb (64.4 kg)  10/03/16 141 lb 14.4 oz (64.4 kg)  09/19/16 139 lb (63 kg)      Other studies Reviewed:  Echocardiogram 08/22/2016 Left ventricle: The cavity size was normal. Wall thickness was   increased in a pattern of mild LVH. Systolic function was mildly   reduced. The estimated ejection fraction was in the range of 45%   to 50%. Diffuse hypokinesis. There is moderate hypokinesis of the   basal-midinferolateral myocardium. Doppler parameters are   consistent with abnormal left ventricular relaxation (grade 1   diastolic dysfunction). - Aortic valve: Mildly calcified annulus. Trileaflet; mildly   calcified leaflets. There was mild regurgitation. Mean gradient   (S): 5 mm Hg. - Mitral valve: Calcified annulus. There was mild regurgitation. - Left atrium: The atrium was mildly dilated. - Right atrium: The atrium was at the upper limits of normal in   size. - Atrial septum: No defect or patent foramen ovale was identified. - Tricuspid valve: There was mild regurgitation. - Pulmonary arteries: PA peak pressure: 34 mm Hg (S). - Pericardium, extracardiac: A trivial pericardial effusion was   identified posterior to the heart.  ASSESSMENT AND PLAN:  1. Chronic Systolic CHF: Echo during hospitalization revealed EF of 45%-50%. She is on bisoprolol, and ramipril.. Continue this regimen.Lasix 20 mg  daily.  Labs on 10/03/2016 creatinine 1.18.  2. O2 Dependence; She is to follow up with Dr. Luan Pulling in the setting of COPD, s/p MRSA pneumonia. She is on steroid taper.   3. Hypertension: BP is well controlled on low dose ACE. Continue this. Labs per PCP   Current medicines are reviewed at length with the patient today.    Labs/ tests ordered today include:  No orders of the defined types were placed in this encounter.    Disposition:   FU with 3-4 months   Signed, Jory Sims, NP  10/09/2016 4:26 PM    [facility] [address], [place], [place] [address] Phone: [phone]; Fax: [phone]

## 2016-10-09 ENCOUNTER — Encounter: Payer: Self-pay | Admitting: Adult Health

## 2016-10-09 ENCOUNTER — Ambulatory Visit (INDEPENDENT_AMBULATORY_CARE_PROVIDER_SITE_OTHER): Payer: Medicare Other | Admitting: Adult Health

## 2016-10-09 VITALS — BP 120/78 | HR 59 | Ht 68.5 in | Wt 142.0 lb

## 2016-10-09 DIAGNOSIS — I10 Essential (primary) hypertension: Secondary | ICD-10-CM

## 2016-10-09 DIAGNOSIS — I5022 Chronic systolic (congestive) heart failure: Secondary | ICD-10-CM | POA: Diagnosis not present

## 2016-10-09 NOTE — Patient Instructions (Addendum)
Medication Instructions:  Your physician recommends that you continue on your current medications as directed. Please refer to the Current Medication list given to you today.  Labwork: NONE  Testing/Procedures: NONE  Follow-Up: Your physician recommends that you schedule a follow-up appointment in: 4 MONTHS  Any Other Special Instructions Will Be Listed Below (If Applicable). FOLLOW UP WITH DR. Luan Pulling ON MAY 14 TH AT 9:00 AM IF YOU TO RESCHEDULE THIS PLEASE CALL [phone] If you need a refill on your cardiac medications before your next appointment, please call your pharmacy.

## 2016-10-12 ENCOUNTER — Ambulatory Visit (INDEPENDENT_AMBULATORY_CARE_PROVIDER_SITE_OTHER): Payer: Medicare Other | Admitting: Gastroenterology

## 2016-10-12 ENCOUNTER — Encounter: Payer: Self-pay | Admitting: Gastroenterology

## 2016-10-12 DIAGNOSIS — D508 Other iron deficiency anemias: Secondary | ICD-10-CM | POA: Diagnosis not present

## 2016-10-12 DIAGNOSIS — B3781 Candidal esophagitis: Secondary | ICD-10-CM

## 2016-10-12 MED ORDER — FLUCONAZOLE 100 MG PO TABS: ORAL_TABLET | ORAL | 0 refills | Status: DC

## 2016-10-12 NOTE — Progress Notes (Signed)
Subjective:    Patient ID: Katelyn Daniel, female    DOB: 1924-05-06, 83 y.o.   MRN: [id number]  Lucia Gaskins, MD  HPI JAN 2018: C/O DYSPHAGIA. HAD EGD/DIL AND CANDIDA ESOPHAGITIS. RX WITH DIF FOR 21 DAYS. ADMITTED APR 2018 WITH PNA. RX WITH ABX/STEROIDS. NOW C/O FEELING LIKE SHE HAS SOMETHING STUCK IN  HER THROAT. AS WELL HAVING DIFFICULTY SWALLOWING SOLIDS/LIQUIDS. SHE HAS A FEELING LIKE SOMETHING IS ON THE ROOF OF HER MOUTH.  PT DENIES FEVER, CHILLS, HEMATOCHEZIA, HEMATEMESIS, nausea, vomiting, melena, diarrhea, CHEST PAIN, SHORTNESS OF BREATH, CHANGE IN BOWEL IN HABITS, constipation, abdominal pain, OR, heartburn or indigestion.  Past Medical History:  Diagnosis Date  . Hyperlipemia   . Hypertension   . Iron deficiency anemia   . Thyroid disease     Past Surgical History:  Procedure Laterality Date  . COLONOSCOPY N/A 06/14/2016   Procedure: COLONOSCOPY;  Surgeon: [name], MD;  [facility];  Service: Endoscopy;  Laterality: N/A;  . ESOPHAGOGASTRODUODENOSCOPY N/A 06/14/2016   Procedure: ESOPHAGOGASTRODUODENOSCOPY (EGD);  Surgeon: [name], MD;  [facility];  Service: Endoscopy;  Laterality: N/A;  WITH DILATION   . TONSILLECTOMY    . [facility] SINUS      No Known Allergies  Current Outpatient Prescriptions  Medication Sig Dispense Refill  . bisoprolol (ZEBETA) 5 MG tablet Take 1 tablet (5 mg total) by mouth daily.    . Calcium Citrate-Vitamin D (CALCIUM CITRATE + D PO) Take 1 tablet by mouth daily.    . clindamycin (CLEOCIN) 150 MG capsule Take 1 capsule (150 mg total) by mouth every 8 (eight) hours.    . furosemide (LASIX) 20 MG tablet Take 1 tablet (20 mg total) by mouth daily.    Marland Kitchen gabapentin (NEURONTIN) 300 MG capsule Take 300 mg by mouth at bedtime.    . levalbuterol (XOPENEX) 0.63 MG/3ML nebulizer solution Take 3 mLs (0.63 mg total) by nebulization 2 (two) times daily.    Marland Kitchen levothyroxine (SYNTHROID, LEVOTHROID) 50 MCG tablet Take 50  mcg by mouth daily.    . Multiple Vitamins-Minerals (PRESERVISION AREDS PO) Take 1 capsule by mouth daily.    . pantoprazole (PROTONIX) 40 MG tablet Take 1 tablet (40 mg total) by mouth 2 (two) times daily before a meal.    . Polysacchar Iron-FA-B12 (FERREX 150 FORTE) 150-1-25 MG-MG-MCG CAPS Take 1 capsule by mouth daily.    . potassium chloride SA (K-DUR,KLOR-CON) 20 MEQ tablet Take 1 tablet (20 mEq total) by mouth daily.    . pravastatin (PRAVACHOL) 40 MG tablet Take 40 mg by mouth daily.    . predniSONE (DELTASONE) 20 MG tablet Take 2 tablets (40 mg total) by mouth daily with breakfast.    . ramipril (ALTACE) 2.5 MG capsule Take 1 capsule (2.5 mg total) by mouth daily.    . traZODone (DESYREL) 50 MG tablet Take 1 tablet (50 mg total) by mouth at bedtime as needed for sleep.     Review of Systems PER HPI OTHERWISE ALL SYSTEMS ARE NEGATIVE.    Objective:   Physical Exam  Constitutional: She is oriented to person, place, and time. She appears well-developed and well-nourished. No distress.  HENT:  Head: Normocephalic and atraumatic.  Mouth/Throat: Oropharynx is clear and moist. No oropharyngeal exudate.  Eyes: Pupils are equal, round, and reactive to light. No scleral icterus.  Neck: Normal range of motion. Neck supple.  Cardiovascular: Normal rate and normal heart sounds.   Irregular RHYTHM, NORMAL  RATE  Pulmonary/Chest: Effort normal and breath sounds normal. No respiratory distress.  Abdominal: Soft. Bowel sounds are normal. She exhibits no distension. There is no tenderness.  Musculoskeletal: She exhibits edema (TEDS HOSES INPLACE, TRACE-1+ BIL LOWER EXTREMITY EDEMA).  KYPHOSIS PRESENT. WALKS ASSISTED.  Lymphadenopathy:    She has no cervical adenopathy.  Neurological: She is alert and oriented to person, place, and time.  HARD OF HEARING, OTHERWISE, NO  NEW FOCAL DEFICITS  Psychiatric: She has a normal mood and affect.  Vitals reviewed.     Assessment & Plan:

## 2016-10-12 NOTE — Progress Notes (Signed)
I PERSONALLY REVIEWED THE CT CHEST DEC 2017 WITH DR. Willaim Bane FILLED ESOPHAGUS, NO ESOPHAGEAL MASS.

## 2016-10-12 NOTE — Progress Notes (Signed)
ON RECALL  °

## 2016-10-12 NOTE — Assessment & Plan Note (Addendum)
NOT TREATED JAN 2018 BECAUSE SHE WA SNOT SYMPTOMATIC. NOW DIFFICULTY SWALLOWING AFTER BEING HOSPITALIZED WITH PNA AND TREATED WITH ABX/STEROIDS.  COMPLETE ANTIBIOTICS AND STEROIDS. TAKE A PROBIOTIC DAILY FOR TWO MONTHS (PILLS: [place] APOTHERCARY BRAND, [place], OR ALIGN OR [place]UR[place]). TAKE A PROBIOTIC TO HELP PREVENT CLOSTRIDUM DIFFICLE COLITIS. ADD DIFLUCAN DAILY FOR 21 DAYS TO TREAT THRUSH AND TROUBLE SWALLOWING. PLEASE CALL IF SWALLOWING IS NOT IMPROVED AFTER 5 DAYS THEN WILL NEED BARIUM PILL ESOPHAGRAM. MEDICATION SIDE EFFECTS INCLUDE YELLOW EYES OR SKIN. DISCUSSED WITH PHARMACY([facility])-CALCULATED GFR < 50. RECOMMENDED DIFLUCAN 200 MG LOAD/100 MG DAILY.  FOLLOW UP IN 6 MOS.

## 2016-10-12 NOTE — Assessment & Plan Note (Addendum)
NO BRBPR OR MELENA. APR 2018 Hb NORMAL AFTER IV FE AND PO IRON. PMHx: ATROPHIC GASTRITIS.  CONTINUE TO MONITOR SYMPTOMS.

## 2016-10-12 NOTE — Progress Notes (Signed)
cc'ed to pcp °

## 2016-10-12 NOTE — Patient Instructions (Signed)
COMPLETE ANTIBIOTICS AND STEROIDS.  TAKE A PROBIOTIC DAILY FOR TWO MONTHS (PILLS: [place] APOTHERCARY BRAND, [place], OR ALIGN OR [place]UR[place]). TAKE A PROBIOTIC TO HELP PREVENT CLOSTRIDUM DIFFICLE COLITIS.  ADD DIFLUCAN DAILY FOR 21 DAYS TO TREAT THRUSH AND TROUBLE SWALLOWING. PLEASE CALL IF SWALLOWING IS NOT IMPROVED AFTER 5 DAYS. MEDICATION SIDE EFFECTS INCLUDE YELLOW EYES OR SKIN.  FOLLOW UP IN 6 MOS.

## 2016-10-24 ENCOUNTER — Other Ambulatory Visit (HOSPITAL_COMMUNITY)
Admission: RE | Admit: 2016-10-24 | Discharge: 2016-10-24 | Disposition: A | Discharge status: Home / Self Care | Payer: Medicare Other | Source: Ambulatory Visit | Attending: Family Medicine | Admitting: Family Medicine

## 2016-10-24 DIAGNOSIS — D500 Iron deficiency anemia secondary to blood loss (chronic): Secondary | ICD-10-CM | POA: Insufficient documentation

## 2016-10-24 DIAGNOSIS — E559 Vitamin D deficiency, unspecified: Secondary | ICD-10-CM | POA: Diagnosis present

## 2016-10-24 DIAGNOSIS — D510 Vitamin B12 deficiency anemia due to intrinsic factor deficiency: Secondary | ICD-10-CM | POA: Insufficient documentation

## 2016-10-24 LAB — BASIC METABOLIC PANEL
ANION GAP: 10 (ref 5–15)
BUN: 33 mg/dL — AB (ref 6–20)
CHLORIDE: 100 mmol/L — AB (ref 101–111)
CO2: 27 mmol/L (ref 22–32)
Calcium: 9.4 mg/dL (ref 8.9–10.3)
Creatinine, Ser: 1.51 mg/dL — ABNORMAL HIGH (ref 0.44–1.00)
GFR calc Af Amer: 34 mL/min — ABNORMAL LOW (ref >60)
GFR calc non Af Amer: 29 mL/min — ABNORMAL LOW (ref >60)
GLUCOSE: 99 mg/dL (ref 65–99)
POTASSIUM: 4 mmol/L (ref 3.5–5.1)
Sodium: 137 mmol/L (ref 135–145)

## 2016-10-24 LAB — CBC
HEMATOCRIT: 43.6 % (ref 36.0–46.0)
HEMOGLOBIN: 14.7 g/dL (ref 12.0–15.0)
MCH: 33.6 pg (ref 26.0–34.0)
MCHC: 33.7 g/dL (ref 30.0–36.0)
MCV: 99.5 fL (ref 78.0–100.0)
PLATELETS: 298 10*3/uL (ref 150–400)
RBC: 4.38 MIL/uL (ref 3.87–5.11)
RDW: 14.7 % (ref 11.5–15.5)
WBC: 8.6 10*3/uL (ref 4.0–10.5)

## 2016-10-24 LAB — IRON AND TIBC
Iron: 83 ug/dL (ref 28–170)
Saturation Ratios: 25 % (ref 10.4–31.8)
TIBC: 335 ug/dL (ref 250–450)
UIBC: 252 ug/dL

## 2016-10-24 LAB — VITAMIN B12: Vitamin B-12: 489 pg/mL (ref 180–914)

## 2016-10-25 LAB — VITAMIN D 25 HYDROXY (VIT D DEFICIENCY, FRACTURES): VIT D 25 HYDROXY: 42 ng/mL (ref 30.0–100.0)

## 2016-12-05 ENCOUNTER — Encounter (HOSPITAL_COMMUNITY): Payer: Medicare Other

## 2016-12-05 ENCOUNTER — Encounter (HOSPITAL_COMMUNITY): Payer: Medicare Other | Attending: Adult Health | Admitting: Adult Health

## 2016-12-05 VITALS — BP 151/85 | HR 98 | Temp 97.9°F | Resp 18 | Wt 138.6 lb

## 2016-12-05 DIAGNOSIS — M79661 Pain in right lower leg: Secondary | ICD-10-CM | POA: Diagnosis not present

## 2016-12-05 DIAGNOSIS — K922 Gastrointestinal hemorrhage, unspecified: Secondary | ICD-10-CM

## 2016-12-05 DIAGNOSIS — M79662 Pain in left lower leg: Secondary | ICD-10-CM | POA: Diagnosis not present

## 2016-12-05 DIAGNOSIS — D500 Iron deficiency anemia secondary to blood loss (chronic): Secondary | ICD-10-CM | POA: Diagnosis not present

## 2016-12-05 DIAGNOSIS — N189 Chronic kidney disease, unspecified: Secondary | ICD-10-CM

## 2016-12-05 LAB — CBC WITH DIFFERENTIAL/PLATELET
Basophils Absolute: 0 10*3/uL (ref 0.0–0.1)
Basophils Relative: 0 %
EOS ABS: 0.1 10*3/uL (ref 0.0–0.7)
Eosinophils Relative: 1 %
HEMATOCRIT: 40.8 % (ref 36.0–46.0)
HEMOGLOBIN: 13.7 g/dL (ref 12.0–15.0)
LYMPHS ABS: 2.5 10*3/uL (ref 0.7–4.0)
LYMPHS PCT: 27 %
MCH: 32.9 pg (ref 26.0–34.0)
MCHC: 33.6 g/dL (ref 30.0–36.0)
MCV: 97.8 fL (ref 78.0–100.0)
MONOS PCT: 9 %
Monocytes Absolute: 0.9 10*3/uL (ref 0.1–1.0)
NEUTROS PCT: 63 %
Neutro Abs: 5.8 10*3/uL (ref 1.7–7.7)
Platelets: 298 10*3/uL (ref 150–400)
RBC: 4.17 MIL/uL (ref 3.87–5.11)
RDW: 14 % (ref 11.5–15.5)
WBC: 9.3 10*3/uL (ref 4.0–10.5)

## 2016-12-05 LAB — BASIC METABOLIC PANEL
Anion gap: 11 (ref 5–15)
BUN: 23 mg/dL — AB (ref 6–20)
CHLORIDE: 101 mmol/L (ref 101–111)
CO2: 25 mmol/L (ref 22–32)
CREATININE: 1.31 mg/dL — AB (ref 0.44–1.00)
Calcium: 9 mg/dL (ref 8.9–10.3)
GFR calc Af Amer: 40 mL/min — ABNORMAL LOW (ref >60)
GFR calc non Af Amer: 34 mL/min — ABNORMAL LOW (ref >60)
GLUCOSE: 87 mg/dL (ref 65–99)
POTASSIUM: 4 mmol/L (ref 3.5–5.1)
SODIUM: 137 mmol/L (ref 135–145)

## 2016-12-05 LAB — IRON AND TIBC
Iron: 116 ug/dL (ref 28–170)
SATURATION RATIOS: 35 % — AB (ref 10.4–31.8)
TIBC: 332 ug/dL (ref 250–450)
UIBC: 216 ug/dL

## 2016-12-05 LAB — FERRITIN: FERRITIN: 38 ng/mL (ref 11–307)

## 2016-12-05 NOTE — Patient Instructions (Signed)
[place] at [facility] Discharge Instructions  RECOMMENDATIONS MADE BY THE CONSULTANT AND ANY TEST RESULTS WILL BE SENT TO YOUR REFERRING PHYSICIAN.  You were seen by Elzie Rings today. She would like for you to return to clinic in apx 3 months with lab work just prior to seeing her. Any questions or concerns prior to your appointment please call office.  Thank you for choosing [place] at [facility] to provide your oncology and hematology care.  To afford each patient quality time with our provider, please arrive at least 15 minutes before your scheduled appointment time.    If you have a lab appointment with the [facility] please come in thru the  Main Entrance and check in at the main information desk  You need to re-schedule your appointment should you arrive 10 or more minutes late.  We strive to give you quality time with our providers, and arriving late affects you and other patients whose appointments are after yours.  Also, if you no show three or more times for appointments you may be dismissed from the clinic at the providers discretion.     Again, thank you for choosing [facility].  Our hope is that these requests will decrease the amount of time that you wait before being seen by our physicians.       _____________________________________________________________  Should you have questions after your visit to [facility], please contact our office at [phone] between the hours of 8:30 a.m. and 4:30 p.m.  Voicemails left after 4:30 p.m. will not be returned until the following business day.  For prescription refill requests, have your pharmacy contact our office.       Resources For Cancer Patients and their Caregivers ? American Cancer Society: Can assist with transportation, wigs, general needs, runs Look Good Feel Better.        713-427-3541 ? Cancer Care: Provides financial assistance,  online support groups, medication/co-pay assistance.  1-800-813-HOPE 5737702430) ? [place] Assists [place] cancer patients and their families through emotional , educational and financial support.  8196291844 ? [place] DSS Where to apply for food stamps, Medicaid and utility assistance. 9392486091 ? RCATS: Transportation to medical appointments. 502-292-1562 ? Social Security Administration: May apply for disability if have a Stage IV cancer. 343-818-2992 (808) 034-2636 ? [employer], Disability and Transit Services: Assists with nutrition, care and transit needs. [place] Support Programs: @10RELATIVEDAYS @ > Cancer Support Group  2nd Tuesday of the month 1pm-2pm, Journey Room  > Creative Journey  3rd Tuesday of the month 1130am-1pm, Journey Room  > Look Good Feel Better  1st Wednesday of the month 10am-12 noon, Journey Room (Call [place] to register [phone])

## 2016-12-05 NOTE — Progress Notes (Signed)
[place], [place] [address]   CLINIC:  Medical Oncology/Hematology  PCP:  Lucia Gaskins, MD [place] [address] [phone]   REASON FOR VISIT:  Follow-up for Iron deficiency anemia d/t chronic blood loss   CURRENT THERAPY: Ferrex forte po daily & IV iron PRN    HISTORY OF PRESENT ILLNESS:  (From Kirby Crigler, PA-C's last note on 09/05/16)     INTERVAL HISTORY:  Ms. Katelyn Daniel 83 y.o. female returns for routine follow-up for iron deficiency anemia thought to be secondary to chronic GI blood loss.   She remains on oral iron with Ferrex forte daily; she tolerates this medication well without any dyspepsia, N&V, or GI complaints. Denies any bleeding episodes including blood in her stools, hematuria, dysuria, nosebleeds, or gingival bleeding.    Reports LE pain, which has been chronic. She also has LE edema; currently wearing bilateral compression stockings.  Pain not relieved with OTC Tylenol or Ibuprofen; also not relieved with prescription-strength Naproxyn.  Endorses pain with walking, but also leg pain at rest.    Since her last visit to the cancer center, she was hospitalized from 09/23/16-5//1/18 for pneumonia.  She continues to have some cough and shortness of breath, but these symptoms are improving.   She sees her PCP, Dr. [name], regularly.     IV iron administration record:  Oncology Flowsheet 07/31/2016  ferumoxytol [facility]) IV 510 mg      REVIEW OF SYSTEMS:  Review of Systems  Constitutional: Positive for fatigue. Negative for chills and fever.  HENT:  Negative.  Negative for lump/mass and nosebleeds.   Eyes: Negative.   Respiratory: Positive for cough and shortness of breath.   Cardiovascular: Positive for leg swelling (on Lasix). Negative for chest pain.  Gastrointestinal: Negative.  Negative for abdominal pain, blood in stool, constipation, diarrhea, nausea and vomiting.  Endocrine:  Negative.   Genitourinary: Negative.  Negative for dysuria and hematuria.   Musculoskeletal: Positive for arthralgias.       Walks with cane  Skin: Negative.  Negative for rash.  Neurological: Negative.  Negative for dizziness and headaches.  Hematological: Negative for adenopathy. Bruises/bleeds easily.  Psychiatric/Behavioral: Negative.  Negative for depression and sleep disturbance. The patient is not nervous/anxious.      PAST MEDICAL/SURGICAL HISTORY:  Past Medical History:  Diagnosis Date  . Hyperlipemia   . Hypertension   . Iron deficiency anemia   . Thyroid disease    Past Surgical History:  Procedure Laterality Date  . COLONOSCOPY N/A 06/14/2016   Procedure: COLONOSCOPY;  Surgeon: [name], MD;  [facility];  Service: Endoscopy;  Laterality: N/A;  . ESOPHAGOGASTRODUODENOSCOPY N/A 06/14/2016   Procedure: ESOPHAGOGASTRODUODENOSCOPY (EGD);  Surgeon: [name], MD;  Location: [facility];  Service: Endoscopy;  Laterality: N/A;  WITH DILATION   . TONSILLECTOMY    . [facility] SINUS       SOCIAL HISTORY:  Social History   Social History  . Marital status: Married    Spouse name: N/A  . Number of children: N/A  . Years of education: N/A   Occupational History  . Not on file.   Social History Main Topics  . Smoking status: Never Smoker  . Smokeless tobacco: Never Used  . Alcohol use No  . Drug use: No  . Sexual activity: Not Currently   Other Topics Concern  . Not on file   Social History Narrative  . No narrative  on file    FAMILY HISTORY:  Family History  Problem Relation Age of Onset  . Asthma Mother   . Congestive Heart Failure Mother   . Emphysema Father   . Heart Problems Brother   . Colon cancer Neg Hx     CURRENT MEDICATIONS:  Outpatient Encounter Prescriptions as of 12/05/2016  Medication Sig  . bisoprolol (ZEBETA) 5 MG tablet Take 1 tablet (5 mg total) by mouth daily.  . Calcium Citrate-Vitamin D (CALCIUM CITRATE + D PO)  Take 1 tablet by mouth daily.  . furosemide (LASIX) 20 MG tablet Take 1 tablet (20 mg total) by mouth daily.  Marland Kitchen gabapentin (NEURONTIN) 300 MG capsule Take 300 mg by mouth at bedtime.  Marland Kitchen levothyroxine (SYNTHROID, LEVOTHROID) 50 MCG tablet Take 50 mcg by mouth daily.  . Multiple Vitamins-Minerals (PRESERVISION AREDS PO) Take 1 capsule by mouth daily.  . pantoprazole (PROTONIX) 40 MG tablet Take 1 tablet (40 mg total) by mouth 2 (two) times daily before a meal.  . Polysacchar Iron-FA-B12 (FERREX 150 FORTE) 150-1-25 MG-MG-MCG CAPS Take 1 capsule by mouth daily.  . potassium chloride SA (K-DUR,KLOR-CON) 20 MEQ tablet Take 1 tablet (20 mEq total) by mouth daily.  . pravastatin (PRAVACHOL) 40 MG tablet Take 40 mg by mouth daily.  . traZODone (DESYREL) 50 MG tablet Take 1 tablet (50 mg total) by mouth at bedtime as needed for sleep.  . [DISCONTINUED] clindamycin (CLEOCIN) 150 MG capsule Take 1 capsule (150 mg total) by mouth every 8 (eight) hours.  . [DISCONTINUED] fluconazole (DIFLUCAN) 100 MG tablet 2 PO ON MAY 10 THEN 1 PO DAILY FOR 20 DAYS.  . [DISCONTINUED] levalbuterol (XOPENEX) 0.63 MG/3ML nebulizer solution Take 3 mLs (0.63 mg total) by nebulization 2 (two) times daily.  . [DISCONTINUED] predniSONE (DELTASONE) 20 MG tablet Take 2 tablets (40 mg total) by mouth daily with breakfast.  . [DISCONTINUED] ramipril (ALTACE) 2.5 MG capsule Take 1 capsule (2.5 mg total) by mouth daily.   No facility-administered encounter medications on file as of 12/05/2016.     ALLERGIES:  No Known Allergies   PHYSICAL EXAM:  ECOG Performance status: 1 - Symptomatic; remains largely independent   Vitals:   12/05/16 1200  BP: (!) 151/85  Pulse: 98  Resp: 18  Temp: 97.9 F (36.6 C)   Filed Weights   12/05/16 1200  Weight: 138 lb 9.6 oz (62.9 kg)    Physical Exam  Constitutional: She is oriented to person, place, and time.  Elderly female in no acute distress   HENT:  Head: Normocephalic.    Mouth/Throat: Oropharynx is clear and moist. No oropharyngeal exudate.  Eyes: Conjunctivae are normal. Pupils are equal, round, and reactive to light. No scleral icterus.  Neck: Normal range of motion. Neck supple.  Cardiovascular: Normal rate and regular rhythm.   Pulmonary/Chest: Effort normal. No respiratory distress.  Mild rhonchi to bilat bases (recent pneumonia)   Abdominal: Soft. Bowel sounds are normal. There is no tenderness. There is no rebound and no guarding.  Musculoskeletal: She exhibits edema (1+ BLE pitting edema (bilat compression stockings in place) ).  -Required assistance getting onto exam table -Ambulates with cane  -Negative Homan's sign   Lymphadenopathy:    She has no cervical adenopathy.  Neurological: She is alert and oriented to person, place, and time. No cranial nerve deficit.  Skin: Skin is warm. No rash noted.  Psychiatric: Mood, memory, affect and judgment normal.  Nursing note and vitals reviewed.    LABORATORY DATA:  I have reviewed the labs as listed.  CBC    Component Value Date/Time   WBC 9.3 12/05/2016 1157   RBC 4.17 12/05/2016 1157   HGB 13.7 12/05/2016 1157   HCT 40.8 12/05/2016 1157   PLT 298 12/05/2016 1157   MCV 97.8 12/05/2016 1157   MCH 32.9 12/05/2016 1157   MCHC 33.6 12/05/2016 1157   RDW 14.0 12/05/2016 1157   LYMPHSABS 2.5 12/05/2016 1157   MONOABS 0.9 12/05/2016 1157   EOSABS 0.1 12/05/2016 1157   BASOSABS 0.0 12/05/2016 1157   CMP Latest Ref Rng & Units 12/05/2016 10/24/2016 10/03/2016  Glucose 65 - 99 mg/dL 87 99 99  BUN 6 - 20 mg/dL 23(H) 33(H) 32(H)  Creatinine 0.44 - 1.00 mg/dL 1.31(H) 1.51(H) 1.10(H)  Sodium 135 - 145 mmol/L 137 137 137  Potassium 3.5 - 5.1 mmol/L 4.0 4.0 4.2  Chloride 101 - 111 mmol/L 101 100(L) 98(L)  CO2 22 - 32 mmol/L 25 27 28   Calcium 8.9 - 10.3 mg/dL 9.0 9.4 9.0  Total Protein 6.5 - 8.1 g/dL - - -  Total Bilirubin 0.3 - 1.2 mg/dL - - -  Alkaline Phos 38 - 126 U/L - - -  AST 15 - 41 U/L - -  -  ALT 14 - 54 U/L - - -    PENDING LABS:    DIAGNOSTIC IMAGING:  *The following radiologic images and reports have been reviewed independently and agree with below findings.  Colonoscopy/EGD: 06/14/16        PATHOLOGY:  06/14/16            ASSESSMENT & PLAN:   Iron deficiency anemia:  -Thought to be secondary to chronic GI blood loss.  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  -Last IV iron infusion with Feraheme in 07/2016; she tolerated without complaints.   Oncology Flowsheet 07/31/2016  ferumoxytol [facility]) IV 510 mg  -Hgb normal today at 13.7 g/dL.  Iron studies are pending for today.  Anemia could also be secondary to CKD with EGFR 34 today. BUN and creatinine chronically elevated as well, at 23/1.31 today.  -Continue Ferrex forte daily as prescribed.   -Return to cancer center in 3-4 months for follow-up with labs.   Bilateral lower extremity pain:  -Likely multifactorial, but could be secondary to claudication, LE edema, muscle weakness, or arthritis.  Clinically, I have low suspicion for DVT; Homan's sign negative today. No reported skin changes to LE per patient. Mild edema noted bilaterally and is symmetric.  Not likely related to hematologic etiology.   -OTC pain relievers not very effective.   -Follow-up with PCP as directed or referral to other specialists as they deem necessary.   Health maintenance:  -Recommended continued follow-up with PCP as directed.     Dispo:  -Return to cancer center in 3-4 months with labs.    All questions were answered to patient's stated satisfaction. Encouraged patient to call with any new concerns or questions before her next visit to the cancer center and we can certain see her sooner,  if needed.    Plan of care discussed with Dr. Talbert Cage, who agrees with the above aforementioned.    A total of 25 minutes was spent in face-to-face care of this patient, with greater than 50% of that time spent in counseling and care-coordination.  Orders placed this encounter:  Orders Placed This Encounter  Procedures  . CBC with Differential/Platelet  . Comprehensive metabolic panel  . Ferritin  . Iron and TIBC      Mike Craze, NP [place] [phone]

## 2016-12-07 ENCOUNTER — Other Ambulatory Visit (HOSPITAL_COMMUNITY): Payer: Self-pay | Admitting: Oncology

## 2016-12-12 ENCOUNTER — Encounter (HOSPITAL_BASED_OUTPATIENT_CLINIC_OR_DEPARTMENT_OTHER): Payer: Medicare Other

## 2016-12-12 ENCOUNTER — Encounter (HOSPITAL_COMMUNITY): Payer: Self-pay

## 2016-12-12 VITALS — BP 123/72 | HR 100 | Temp 97.7°F | Resp 18

## 2016-12-12 DIAGNOSIS — D500 Iron deficiency anemia secondary to blood loss (chronic): Secondary | ICD-10-CM | POA: Diagnosis present

## 2016-12-12 DIAGNOSIS — D508 Other iron deficiency anemias: Secondary | ICD-10-CM

## 2016-12-12 DIAGNOSIS — K922 Gastrointestinal hemorrhage, unspecified: Secondary | ICD-10-CM

## 2016-12-12 MED ORDER — SODIUM CHLORIDE 0.9 % IV SOLN
510.0000 mg | Freq: Once | INTRAVENOUS | Status: AC
  Administered 2016-12-12: 510 mg via INTRAVENOUS
  Filled 2016-12-12: qty 17

## 2016-12-12 MED ORDER — SODIUM CHLORIDE 0.9 % IV SOLN
Freq: Once | INTRAVENOUS | Status: AC
  Administered 2016-12-12: 14:00:00 via INTRAVENOUS

## 2016-12-12 NOTE — Patient Instructions (Signed)
[facility] at [facility] Discharge Instructions  RECOMMENDATIONS MADE BY THE CONSULTANT AND ANY TEST RESULTS WILL BE SENT TO YOUR REFERRING PHYSICIAN.  Received Feraheme infusion today.Follow-up as scheduled. Call clinic for any questions or concerns  Thank you for choosing [facility] at [facility] to provide your oncology and hematology care.  To afford each patient quality time with our provider, please arrive at least 15 minutes before your scheduled appointment time.    If you have a lab appointment with the Cancer Center please come in thru the  Main Entrance and check in at the main information desk  You need to re-schedule your appointment should you arrive 10 or more minutes late.  We strive to give you quality time with our providers, and arriving late affects you and other patients whose appointments are after yours.  Also, if you no show three or more times for appointments you may be dismissed from the clinic at the providers discretion.     Again, thank you for choosing [facility].  Our hope is that these requests will decrease the amount of time that you wait before being seen by our physicians.       _____________________________________________________________  Should you have questions after your visit to [facility], please contact our office at [phone] between the hours of 8:30 a.m. and 4:30 p.m.  Voicemails left after 4:30 p.m. will not be returned until the following business day.  For prescription refill requests, have your pharmacy contact our office.       Resources For Cancer Patients and their Caregivers ? American Cancer Society: Can assist with transportation, wigs, general needs, runs Look Good Feel Better.        1-888-227-6333 ? Cancer Care: Provides financial assistance, online support groups, medication/co-pay assistance.  1-800-813-HOPE (4673) ? [facility] Assists [place] cancer patients and their families through emotional , educational and financial support.  336-427-4357 ? [place] DSS Where to apply for food stamps, Medicaid and utility assistance. 336-342-1394 ? [employer]: Transportation to medical appointments. 336-347-2287 ? Social Security Administration: May apply for disability if have a Stage IV cancer. 336-342-7796 1-800-772-1213 ? [place] Aging, Disability and Transit Services: Assists with nutrition, care and transit needs. 336-349-2343  Cancer Center Support Programs: @10RELATIVEDAYS@ > Cancer Support Group  2nd Tuesday of the month 1pm-2pm, Journey Room  > Creative Journey  3rd Tuesday of the month 1130am-1pm, Journey Room  > Look Good Feel Better  1st Wednesday of the month 10am-12 noon, Journey Room (Call [employer] to register 1-800-395-5775)   

## 2016-12-12 NOTE — Progress Notes (Signed)
Katelyn Daniel tolerated Feraheme infusion well without complaints or incident. VSS Pt discharged self ambulatory using her cane with assistance. Pt discharged in satisfactory condition accompanied by a caregiver

## 2016-12-19 ENCOUNTER — Ambulatory Visit: Payer: Medicare Other | Admitting: Adult Health

## 2017-02-06 ENCOUNTER — Encounter: Payer: Self-pay | Admitting: Adult Health

## 2017-02-06 ENCOUNTER — Ambulatory Visit (INDEPENDENT_AMBULATORY_CARE_PROVIDER_SITE_OTHER): Payer: Medicare Other | Admitting: Adult Health

## 2017-02-06 VITALS — BP 106/66 | HR 95 | Ht 67.0 in | Wt 140.0 lb

## 2017-02-06 DIAGNOSIS — I5032 Chronic diastolic (congestive) heart failure: Secondary | ICD-10-CM

## 2017-02-06 DIAGNOSIS — E038 Other specified hypothyroidism: Secondary | ICD-10-CM | POA: Diagnosis not present

## 2017-02-06 DIAGNOSIS — I10 Essential (primary) hypertension: Secondary | ICD-10-CM

## 2017-02-06 DIAGNOSIS — N189 Chronic kidney disease, unspecified: Secondary | ICD-10-CM | POA: Diagnosis not present

## 2017-02-06 NOTE — Progress Notes (Signed)
Cardiology Office Note   Date:  02/06/2017   ID:  Katelyn Daniel, DOB [birth year], MRN [id number]  PCP:  Lucia Gaskins, MD  Cardiologist: [name]  Chief Complaint  Patient presents with  . Hypertension  . Congestive Heart Failure     History of Present Illness: Katelyn Daniel is a 83 y.o. female who presents for ongoing assessment and management of hypertension, chronic systolic heart failure, hyperlipidemia, with other history to include hyperlipidemia, O2 dependent COPD and iron deficiency anemia.. Most recent echocardiogram was in March 2018 with EF of 45-50% with diffuse hypokinesis, moderate hypokinesis of the basal mid inferior lateral myocardium.  The patient was last seen in the office on May 2018, at that time she was without any cardiac complaints and was continued on bisoprolol. She remained on oxygen and is being followed by Dr. Luan Pulling in the setting of COPD. Blood pressure was well-controlled.  She states that she occasionally feels her heart racing and has to remind herself to take a deep breath when she is lying in bed. She has been medically compliant. Weight has been essentially the same.   Past Medical History:  Diagnosis Date  . Hyperlipemia   . Hypertension   . Iron deficiency anemia   . Thyroid disease     Past Surgical History:  Procedure Laterality Date  . COLONOSCOPY N/A 06/14/2016   Procedure: COLONOSCOPY;  Surgeon: [name], MD;  [facility];  Service: Endoscopy;  Laterality: N/A;  . ESOPHAGOGASTRODUODENOSCOPY N/A 06/14/2016   Procedure: ESOPHAGOGASTRODUODENOSCOPY (EGD);  Surgeon: [name], MD;  [facility];  Service: Endoscopy;  Laterality: N/A;  WITH DILATION   . TONSILLECTOMY    . [facility] SINUS       Current Outpatient Prescriptions  Medication Sig Dispense Refill  . bisoprolol (ZEBETA) 5 MG tablet Take 1 tablet (5 mg total) by mouth daily. 30 tablet 2  . Calcium Citrate-Vitamin D (CALCIUM CITRATE + D PO) Take 1  tablet by mouth daily.    . furosemide (LASIX) 20 MG tablet Take 1 tablet (20 mg total) by mouth daily. 90 tablet 3  . gabapentin (NEURONTIN) 300 MG capsule Take 300 mg by mouth at bedtime.    Marland Kitchen levothyroxine (SYNTHROID, LEVOTHROID) 50 MCG tablet Take 50 mcg by mouth daily.    . Multiple Vitamins-Minerals (PRESERVISION AREDS PO) Take 1 capsule by mouth daily.    . pantoprazole (PROTONIX) 40 MG tablet Take 1 tablet (40 mg total) by mouth 2 (two) times daily before a meal. 60 tablet 3  . Polysacchar Iron-FA-B12 (FERREX 150 FORTE) 150-1-25 MG-MG-MCG CAPS Take 1 capsule by mouth daily. 30 capsule 11  . potassium chloride SA (K-DUR,KLOR-CON) 20 MEQ tablet Take 1 tablet (20 mEq total) by mouth daily. 90 tablet 3  . pravastatin (PRAVACHOL) 40 MG tablet Take 40 mg by mouth daily.  3  . traZODone (DESYREL) 50 MG tablet Take 1 tablet (50 mg total) by mouth at bedtime as needed for sleep. 30 tablet 3   No current facility-administered medications for this visit.     Allergies:   Patient has no known allergies.    Social History:  The patient  reports that she has never smoked. She has never used smokeless tobacco. She reports that she does not drink alcohol or use drugs.   Family History:  The patient's family history includes Asthma in her mother; Congestive Heart Failure in her mother; Emphysema in her father; Heart Problems in her brother.  ROS: All other systems are reviewed and negative. Unless otherwise mentioned in H&P    PHYSICAL EXAM: VS:  There were no vitals taken for this visit. , BMI There is no height or weight on file to calculate BMI. GEN: Well nourished, well developed, in no acute distress  HEENT: normal  Neck: no JVD, carotid bruits, or masses Cardiac: RRR; 1/6 soft systolic  Murmurs, no  rubs, or gallops,no edema  Respiratory:  Clear to auscultation bilaterally, normal work of breathing GI: soft, nontender, nondistended, + BS MS: no deformity or atrophy  Skin: warm and  dry, no rash Neuro:  Strength and sensation are intact Psych: euthymic mood, full affect   Recent Labs: 06/12/2016: ALT 13; TSH 3.339 09/24/2016: Magnesium 1.9 09/25/2016: B Natriuretic Peptide 608.0 12/05/2016: BUN 23; Creatinine, Ser 1.31; Hemoglobin 13.7; Platelets 298; Potassium 4.0; Sodium 137      Wt Readings from Last 3 Encounters:  12/05/16 138 lb 9.6 oz (62.9 kg)  10/12/16 140 lb 12.8 oz (63.9 kg)  10/09/16 142 lb (64.4 kg)      Other studies Reviewed:  Echocardiogram  08/25/2016 Left ventricle: The cavity size was normal. Wall thickness was   increased in a pattern of mild LVH. Systolic function was mildly   reduced. The estimated ejection fraction was in the range of 45%   to 50%. Diffuse hypokinesis. There is moderate hypokinesis of the   basal-midinferolateral myocardium. Doppler parameters are   consistent with abnormal left ventricular relaxation (grade 1   diastolic dysfunction). - Aortic valve: Mildly calcified annulus. Trileaflet; mildly   calcified leaflets. There was mild regurgitation. Mean gradient   (S): 5 mm Hg. - Mitral valve: Calcified annulus. There was mild regurgitation. - Left atrium: The atrium was mildly dilated. - Right atrium: The atrium was at the upper limits of normal in   size. - Atrial septum: No defect or patent foramen ovale was identified. - Tricuspid valve: There was mild regurgitation. - Pulmonary arteries: PA peak pressure: 34 mm Hg (S). - Pericardium, extracardiac: A trivial pericardial effusion was   identified posterior to the heart.  ASSESSMENT AND PLAN:  1. Hypertension : BP is soft but she is asymptomatic for dizziness with position change or otherwise. She continues on lasix 20 mg BID. Can potentially reduced to daily if she does become symptomatic or hypotensive. Will have a BMET ordered for kidney function. Continue bisoprolol.  2. Chronic Diastolic CHF: No evidence of fluid overload. Weight is essentially the same.  Continue lasix.   3. CKD: Stage Stage II: GFR 34: BMET.   4. Iron Deficiency Anemia: Check CBC due to complaints of dyspnea.    Current medicines are reviewed at length with the patient today.  Labs will be sent to [place].   Labs/ tests ordered today include: BMET, CBC, TSH.  Phill Myron. [place] Pugh, ANP, AACC   02/06/2017 1:03 PM     [facility] [address], [place], [place] [address] Phone: [phone]; Fax: [phone]

## 2017-02-06 NOTE — Patient Instructions (Addendum)
Your physician wants you to follow-up in:  6 months with Dr. Raliegh Ip.Lawrence NP You will receive a reminder letter in the mail two months in advance. If you don't receive a letter, please call our office to schedule the follow-up appointment.   Your physician recommends that you continue on your current medications as directed. Please refer to the Current Medication list given to you today.    If you need a refill on your cardiac medications before your next appointment, please call your pharmacy.     Get lab work this week : CBC, BMET, TSH     No tests ordered at visit today.     Thank you for choosing [place] !

## 2017-02-08 LAB — CBC
HEMATOCRIT: 39.1 % (ref 35.0–45.0)
Hemoglobin: 13.3 g/dL (ref 11.7–15.5)
MCH: 32.7 pg (ref 27.0–33.0)
MCHC: 34 g/dL (ref 32.0–36.0)
MCV: 96.1 fL (ref 80.0–100.0)
MPV: 10.9 fL (ref 7.5–12.5)
PLATELETS: 341 10*3/uL (ref 140–400)
RBC: 4.07 10*6/uL (ref 3.80–5.10)
RDW: 12.7 % (ref 11.0–15.0)
WBC: 4.7 10*3/uL (ref 3.8–10.8)

## 2017-02-08 LAB — BASIC METABOLIC PANEL WITH GFR
BUN/Creatinine Ratio: 18 (calc) (ref 6–22)
BUN: 24 mg/dL (ref 7–25)
CALCIUM: 9.2 mg/dL (ref 8.6–10.4)
CHLORIDE: 102 mmol/L (ref 98–110)
CO2: 28 mmol/L (ref 20–32)
Creat: 1.37 mg/dL — ABNORMAL HIGH (ref 0.60–0.88)
GFR, EST AFRICAN AMERICAN: 39 mL/min/{1.73_m2} — AB (ref >=60)
GFR, EST NON AFRICAN AMERICAN: 34 mL/min/{1.73_m2} — AB (ref >=60)
Glucose, Bld: 84 mg/dL (ref 65–139)
POTASSIUM: 4.1 mmol/L (ref 3.5–5.3)
Sodium: 139 mmol/L (ref 135–146)

## 2017-02-08 LAB — TSH: TSH: 3.34 m[IU]/L (ref 0.40–4.50)

## 2017-03-05 ENCOUNTER — Emergency Department (HOSPITAL_COMMUNITY): Payer: Medicare Other

## 2017-03-05 ENCOUNTER — Inpatient Hospital Stay (HOSPITAL_COMMUNITY)
Admission: EM | Admit: 2017-03-05 | Discharge: 2017-03-09 | DRG: 291 | Disposition: A | Discharge status: Home Health | Payer: Medicare Other | Attending: Internal Medicine | Admitting: Internal Medicine

## 2017-03-05 ENCOUNTER — Encounter (HOSPITAL_COMMUNITY): Payer: Self-pay | Admitting: Emergency Medicine

## 2017-03-05 DIAGNOSIS — T50905A Adverse effect of unspecified drugs, medicaments and biological substances, initial encounter: Secondary | ICD-10-CM

## 2017-03-05 DIAGNOSIS — Y95 Nosocomial condition: Secondary | ICD-10-CM | POA: Diagnosis not present

## 2017-03-05 DIAGNOSIS — R778 Other specified abnormalities of plasma proteins: Secondary | ICD-10-CM | POA: Diagnosis present

## 2017-03-05 DIAGNOSIS — E7801 Familial hypercholesterolemia: Secondary | ICD-10-CM

## 2017-03-05 DIAGNOSIS — E785 Hyperlipidemia, unspecified: Secondary | ICD-10-CM | POA: Diagnosis not present

## 2017-03-05 DIAGNOSIS — K219 Gastro-esophageal reflux disease without esophagitis: Secondary | ICD-10-CM | POA: Diagnosis not present

## 2017-03-05 DIAGNOSIS — R531 Weakness: Secondary | ICD-10-CM | POA: Diagnosis not present

## 2017-03-05 DIAGNOSIS — I130 Hypertensive heart and chronic kidney disease with heart failure and stage 1 through stage 4 chronic kidney disease, or unspecified chronic kidney disease: Principal | ICD-10-CM | POA: Diagnosis present

## 2017-03-05 DIAGNOSIS — J690 Pneumonitis due to inhalation of food and vomit: Secondary | ICD-10-CM | POA: Diagnosis not present

## 2017-03-05 DIAGNOSIS — I700 Atherosclerosis of aorta: Secondary | ICD-10-CM | POA: Diagnosis present

## 2017-03-05 DIAGNOSIS — Z8249 Family history of ischemic heart disease and other diseases of the circulatory system: Secondary | ICD-10-CM

## 2017-03-05 DIAGNOSIS — E039 Hypothyroidism, unspecified: Secondary | ICD-10-CM | POA: Diagnosis not present

## 2017-03-05 DIAGNOSIS — N183 Chronic kidney disease, stage 3 unspecified: Secondary | ICD-10-CM | POA: Diagnosis present

## 2017-03-05 DIAGNOSIS — I5043 Acute on chronic combined systolic (congestive) and diastolic (congestive) heart failure: Secondary | ICD-10-CM | POA: Diagnosis present

## 2017-03-05 DIAGNOSIS — I10 Essential (primary) hypertension: Secondary | ICD-10-CM | POA: Diagnosis present

## 2017-03-05 DIAGNOSIS — Z825 Family history of asthma and other chronic lower respiratory diseases: Secondary | ICD-10-CM | POA: Diagnosis not present

## 2017-03-05 DIAGNOSIS — E030 Congenital hypothyroidism with diffuse goiter: Secondary | ICD-10-CM | POA: Diagnosis not present

## 2017-03-05 DIAGNOSIS — R739 Hyperglycemia, unspecified: Secondary | ICD-10-CM | POA: Diagnosis not present

## 2017-03-05 DIAGNOSIS — J189 Pneumonia, unspecified organism: Secondary | ICD-10-CM

## 2017-03-05 DIAGNOSIS — R636 Underweight: Secondary | ICD-10-CM | POA: Diagnosis present

## 2017-03-05 DIAGNOSIS — J181 Lobar pneumonia, unspecified organism: Secondary | ICD-10-CM | POA: Diagnosis present

## 2017-03-05 DIAGNOSIS — J9601 Acute respiratory failure with hypoxia: Secondary | ICD-10-CM | POA: Diagnosis present

## 2017-03-05 DIAGNOSIS — Z6822 Body mass index (BMI) 22.0-22.9, adult: Secondary | ICD-10-CM

## 2017-03-05 DIAGNOSIS — T380X5A Adverse effect of glucocorticoids and synthetic analogues, initial encounter: Secondary | ICD-10-CM | POA: Diagnosis not present

## 2017-03-05 DIAGNOSIS — G4700 Insomnia, unspecified: Secondary | ICD-10-CM | POA: Diagnosis not present

## 2017-03-05 DIAGNOSIS — D509 Iron deficiency anemia, unspecified: Secondary | ICD-10-CM | POA: Diagnosis present

## 2017-03-05 DIAGNOSIS — Z23 Encounter for immunization: Secondary | ICD-10-CM

## 2017-03-05 DIAGNOSIS — I5022 Chronic systolic (congestive) heart failure: Secondary | ICD-10-CM | POA: Diagnosis not present

## 2017-03-05 DIAGNOSIS — F419 Anxiety disorder, unspecified: Secondary | ICD-10-CM | POA: Diagnosis present

## 2017-03-05 DIAGNOSIS — G629 Polyneuropathy, unspecified: Secondary | ICD-10-CM | POA: Diagnosis not present

## 2017-03-05 DIAGNOSIS — R748 Abnormal levels of other serum enzymes: Secondary | ICD-10-CM | POA: Diagnosis not present

## 2017-03-05 DIAGNOSIS — E038 Other specified hypothyroidism: Secondary | ICD-10-CM | POA: Diagnosis not present

## 2017-03-05 DIAGNOSIS — R7989 Other specified abnormal findings of blood chemistry: Secondary | ICD-10-CM | POA: Diagnosis present

## 2017-03-05 HISTORY — DX: Candidal esophagitis: B37.81

## 2017-03-05 HISTORY — DX: Chronic kidney disease, stage 3 (moderate): N18.3

## 2017-03-05 LAB — PROCALCITONIN: Procalcitonin: <0.1 ng/mL

## 2017-03-05 LAB — PHOSPHORUS: Phosphorus: 4 mg/dL (ref 2.5–4.6)

## 2017-03-05 LAB — BASIC METABOLIC PANEL
Anion gap: 12 (ref 5–15)
BUN: 22 mg/dL — AB (ref 6–20)
CHLORIDE: 102 mmol/L (ref 101–111)
CO2: 24 mmol/L (ref 22–32)
Calcium: 9 mg/dL (ref 8.9–10.3)
Creatinine, Ser: 1.45 mg/dL — ABNORMAL HIGH (ref 0.44–1.00)
GFR calc Af Amer: 35 mL/min — ABNORMAL LOW (ref >60)
GFR calc non Af Amer: 30 mL/min — ABNORMAL LOW (ref >60)
Glucose, Bld: 105 mg/dL — ABNORMAL HIGH (ref 65–99)
POTASSIUM: 4 mmol/L (ref 3.5–5.1)
SODIUM: 138 mmol/L (ref 135–145)

## 2017-03-05 LAB — TSH: TSH: 6.603 u[IU]/mL — ABNORMAL HIGH (ref 0.350–4.500)

## 2017-03-05 LAB — MRSA PCR SCREENING: MRSA by PCR: NEGATIVE

## 2017-03-05 LAB — I-STAT TROPONIN, ED: Troponin i, poc: 0.04 ng/mL (ref 0.00–0.08)

## 2017-03-05 LAB — I-STAT CG4 LACTIC ACID, ED: LACTIC ACID, VENOUS: 1.69 mmol/L (ref 0.5–1.9)

## 2017-03-05 LAB — CBC
HEMATOCRIT: 40.3 % (ref 36.0–46.0)
Hemoglobin: 13.4 g/dL (ref 12.0–15.0)
MCH: 32.7 pg (ref 26.0–34.0)
MCHC: 33.3 g/dL (ref 30.0–36.0)
MCV: 98.3 fL (ref 78.0–100.0)
Platelets: 288 10*3/uL (ref 150–400)
RBC: 4.1 MIL/uL (ref 3.87–5.11)
RDW: 13.2 % (ref 11.5–15.5)
WBC: 7.3 10*3/uL (ref 4.0–10.5)

## 2017-03-05 LAB — BRAIN NATRIURETIC PEPTIDE: B NATRIURETIC PEPTIDE 5: 1510.2 pg/mL — AB (ref 0.0–100.0)

## 2017-03-05 LAB — MAGNESIUM: Magnesium: 2.1 mg/dL (ref 1.7–2.4)

## 2017-03-05 LAB — TROPONIN I: TROPONIN I: 0.05 ng/mL — AB (ref <0.03)

## 2017-03-05 MED ORDER — LEVOTHYROXINE SODIUM 50 MCG PO TABS
50.0000 ug | ORAL_TABLET | Freq: Every day | ORAL | Status: DC
  Administered 2017-03-06: 50 ug via ORAL
  Filled 2017-03-05: qty 1

## 2017-03-05 MED ORDER — ACETAMINOPHEN 650 MG RE SUPP: 650.0000 mg | Freq: Four times a day (QID) | RECTAL | Status: DC | PRN

## 2017-03-05 MED ORDER — ONDANSETRON HCL 4 MG/2ML IJ SOLN: 4.0000 mg | Freq: Four times a day (QID) | INTRAMUSCULAR | Status: DC | PRN

## 2017-03-05 MED ORDER — ALBUTEROL SULFATE (2.5 MG/3ML) 0.083% IN NEBU
5.0000 mg | INHALATION_SOLUTION | Freq: Once | RESPIRATORY_TRACT | Status: AC
  Administered 2017-03-05: 5 mg via RESPIRATORY_TRACT

## 2017-03-05 MED ORDER — PRAVASTATIN SODIUM 40 MG PO TABS
40.0000 mg | ORAL_TABLET | Freq: Every day | ORAL | Status: DC
  Administered 2017-03-06 – 2017-03-09 (×4): 40 mg via ORAL
  Filled 2017-03-05 (×4): qty 1

## 2017-03-05 MED ORDER — POTASSIUM CHLORIDE CRYS ER 20 MEQ PO TBCR
20.0000 meq | EXTENDED_RELEASE_TABLET | Freq: Every day | ORAL | Status: DC
  Administered 2017-03-05 – 2017-03-06 (×2): 20 meq via ORAL
  Filled 2017-03-05 (×2): qty 1

## 2017-03-05 MED ORDER — ENOXAPARIN SODIUM 30 MG/0.3ML ~~LOC~~ SOLN
30.0000 mg | SUBCUTANEOUS | Status: DC
  Administered 2017-03-05 – 2017-03-08 (×4): 30 mg via SUBCUTANEOUS
  Filled 2017-03-05 (×4): qty 0.3

## 2017-03-05 MED ORDER — BISOPROLOL FUMARATE 5 MG PO TABS
5.0000 mg | ORAL_TABLET | Freq: Every day | ORAL | Status: DC
  Administered 2017-03-06 – 2017-03-09 (×4): 5 mg via ORAL
  Filled 2017-03-05 (×4): qty 1

## 2017-03-05 MED ORDER — ACETAMINOPHEN 325 MG PO TABS
650.0000 mg | ORAL_TABLET | Freq: Four times a day (QID) | ORAL | Status: DC | PRN
  Administered 2017-03-05 – 2017-03-07 (×2): 650 mg via ORAL
  Filled 2017-03-05 (×2): qty 2

## 2017-03-05 MED ORDER — GABAPENTIN 300 MG PO CAPS
300.0000 mg | ORAL_CAPSULE | Freq: Every day | ORAL | Status: DC
  Administered 2017-03-05 – 2017-03-08 (×4): 300 mg via ORAL
  Filled 2017-03-05 (×4): qty 1

## 2017-03-05 MED ORDER — SODIUM CHLORIDE 0.9% FLUSH
3.0000 mL | Freq: Two times a day (BID) | INTRAVENOUS | Status: DC
  Administered 2017-03-05 – 2017-03-06 (×2): 3 mL via INTRAVENOUS

## 2017-03-05 MED ORDER — DM-GUAIFENESIN ER 30-600 MG PO TB12: 1.0000 | ORAL_TABLET | Freq: Two times a day (BID) | ORAL | Status: DC | PRN

## 2017-03-05 MED ORDER — ONDANSETRON HCL 4 MG PO TABS: 4.0000 mg | ORAL_TABLET | Freq: Four times a day (QID) | ORAL | Status: DC | PRN

## 2017-03-05 MED ORDER — SODIUM CHLORIDE 0.9% FLUSH: 3.0000 mL | INTRAVENOUS | Status: DC | PRN

## 2017-03-05 MED ORDER — DEXTROSE 5 % IV SOLN
500.0000 mg | INTRAVENOUS | Status: AC
  Administered 2017-03-05 – 2017-03-07 (×3): 500 mg via INTRAVENOUS
  Filled 2017-03-05 (×3): qty 500

## 2017-03-05 MED ORDER — PANTOPRAZOLE SODIUM 40 MG PO TBEC
40.0000 mg | DELAYED_RELEASE_TABLET | Freq: Every day | ORAL | Status: DC
  Administered 2017-03-06 – 2017-03-09 (×4): 40 mg via ORAL
  Filled 2017-03-05 (×4): qty 1

## 2017-03-05 MED ORDER — FUROSEMIDE 10 MG/ML IJ SOLN
60.0000 mg | Freq: Once | INTRAMUSCULAR | Status: AC
  Administered 2017-03-05: 60 mg via INTRAVENOUS
  Filled 2017-03-05: qty 6

## 2017-03-05 MED ORDER — TRAZODONE HCL 50 MG PO TABS
50.0000 mg | ORAL_TABLET | Freq: Every evening | ORAL | Status: DC | PRN
  Administered 2017-03-06 – 2017-03-08 (×3): 50 mg via ORAL
  Filled 2017-03-05 (×3): qty 1

## 2017-03-05 MED ORDER — DIAZEPAM 2 MG PO TABS: 2.0000 mg | ORAL_TABLET | Freq: Two times a day (BID) | ORAL | Status: DC | PRN

## 2017-03-05 MED ORDER — FUROSEMIDE 10 MG/ML IJ SOLN
20.0000 mg | Freq: Two times a day (BID) | INTRAMUSCULAR | Status: DC
  Administered 2017-03-05 – 2017-03-06 (×2): 20 mg via INTRAVENOUS
  Filled 2017-03-05 (×2): qty 2

## 2017-03-05 MED ORDER — INFLUENZA VAC SPLIT HIGH-DOSE 0.5 ML IM SUSY
0.5000 mL | PREFILLED_SYRINGE | INTRAMUSCULAR | Status: AC
  Administered 2017-03-09: 0.5 mL via INTRAMUSCULAR
  Filled 2017-03-05: qty 0.5

## 2017-03-05 MED ORDER — DEXTROSE 5 % IV SOLN
1.0000 g | INTRAVENOUS | Status: AC
  Administered 2017-03-05 – 2017-03-07 (×3): 1 g via INTRAVENOUS
  Filled 2017-03-05 (×3): qty 10

## 2017-03-05 MED ORDER — SODIUM CHLORIDE 0.9 % IV SOLN: 250.0000 mL | INTRAVENOUS | Status: DC | PRN

## 2017-03-05 NOTE — ED Notes (Signed)
Patient transported to X-ray 

## 2017-03-05 NOTE — ED Notes (Signed)
Dinner tray delivered.

## 2017-03-05 NOTE — Progress Notes (Signed)
CRITICAL VALUE ALERT  Critical Value:  Troponin 0.05  Date & Time Notied:  2253 03/05/17  Provider Notified: Raliegh Ip Schorr  Orders Received/Actions taken: no orders received

## 2017-03-05 NOTE — ED Provider Notes (Signed)
[facility] DEPT Provider Note   CSN: [id number] Arrival date & time: 03/05/17  1139     History   Chief Complaint Chief Complaint  Patient presents with  . Shortness of Breath  . Chest Pain    HPI MADALYNN PICKELSIMER is a 83 y.o. female.  83 year old female presents with increased dyspnea on exertion times several days. Was seen at urgent care yesterday and diagnosed with pneumonia placed on Levaquin. States that her trouble breathing has increased today and has not been associated with chest pain or chest pressure but she has has some orthopnea. Also notes some lower extremity edema as well too. No fever, chills, vomiting, diarrhea. No pleuritic component to her symptoms. Her symptoms are better with rest.      Past Medical History:  Diagnosis Date  . Hyperlipemia   . Hypertension   . Iron deficiency anemia   . Thyroid disease     Patient Active Problem List   Diagnosis Date Noted  . Aspiration pneumonia ([place]) 09/23/2016  . Acute on chronic combined systolic and diastolic CHF (congestive heart failure) ([place]) 09/23/2016  . Iron deficiency anemia   . Candida esophagitis ([place])   . Generalized weakness 06/12/2016  . AKI (acute kidney injury) ([place]) 06/12/2016  . Mild dehydration 06/12/2016  . Hypothyroidism 06/12/2016  . Essential hypertension 06/12/2016  . Hyperlipidemia 06/12/2016    Past Surgical History:  Procedure Laterality Date  . COLONOSCOPY N/A 06/14/2016   Procedure: COLONOSCOPY;  Surgeon: [name], MD;  Location: [facility];  Service: Endoscopy;  Laterality: N/A;  . ESOPHAGOGASTRODUODENOSCOPY N/A 06/14/2016   Procedure: ESOPHAGOGASTRODUODENOSCOPY (EGD);  Surgeon: [name], MD;  Location: [facility];  Service: Endoscopy;  Laterality: N/A;  WITH DILATION   . TONSILLECTOMY    . [place] SINUS      OB History    Gravida Para Term Preterm AB Living             2   SAB TAB Ectopic Multiple Live Births                   Home Medications      Prior to Admission medications   Medication Sig Start Date End Date Taking? Authorizing Provider  bisoprolol (ZEBETA) 5 MG tablet Take 1 tablet (5 mg total) by mouth daily. 10/04/16   Lucia Gaskins, MD  Calcium Citrate-Vitamin D (CALCIUM CITRATE + D PO) Take 1 tablet by mouth daily.    [provider]  furosemide (LASIX) 20 MG tablet Take 20 mg by mouth 2 (two) times daily.  01/26/17   [provider]  gabapentin (NEURONTIN) 300 MG capsule Take 300 mg by mouth at bedtime. 06/06/16   [provider]  levothyroxine (SYNTHROID, LEVOTHROID) 50 MCG tablet Take 50 mcg by mouth daily. 06/02/16   [provider]  Multiple Vitamins-Minerals (PRESERVISION AREDS PO) Take 1 capsule by mouth daily.    [provider]  pantoprazole (PROTONIX) 40 MG tablet Take 1 tablet (40 mg total) by mouth 2 (two) times daily before a meal. Patient taking differently: Take 40 mg by mouth daily.  06/15/16   Lucia Gaskins, MD  Polysacchar Iron-FA-B12 (FERREX 150 FORTE) 150-1-25 MG-MG-MCG CAPS Take 1 capsule by mouth daily. 09/05/16   Baird Cancer, PA-C  potassium chloride SA (K-DUR,KLOR-CON) 20 MEQ tablet Take 1 tablet (20 mEq total) by mouth daily. 09/21/16   Lendon Colonel, NP  pravastatin (PRAVACHOL) 40 MG tablet Take 40 mg by mouth  daily. 03/16/16   [provider]  traZODone (DESYREL) 50 MG tablet Take 1 tablet (50 mg total) by mouth at bedtime as needed for sleep. 06/15/16   Lucia Gaskins, MD    Family History Family History  Problem Relation Age of Onset  . Asthma Mother   . Congestive Heart Failure Mother   . Emphysema Father   . Heart Problems Brother   . Colon cancer Neg Hx     Social History Social History  Substance Use Topics  . Smoking status: Never Smoker  . Smokeless tobacco: Never Used  . Alcohol use No     Allergies   Patient has no known allergies.   Review of Systems Review of Systems  All other systems reviewed and  are negative.    Physical Exam Updated Vital Signs BP 111/86 (BP Location: Right Arm)   Pulse (!) 106   Temp 98 F (36.7 C) (Oral)   Resp 17   SpO2 96%   Physical Exam  Constitutional: She is oriented to person, place, and time. She appears well-developed and well-nourished.  Non-toxic appearance. No distress.  HENT:  Head: Normocephalic and atraumatic.  Eyes: Pupils are equal, round, and reactive to light. Conjunctivae, EOM and lids are normal.  Neck: Normal range of motion. Neck supple. No tracheal deviation present. No thyroid mass present.  Cardiovascular: Normal rate, regular rhythm and normal heart sounds.  Exam reveals no gallop.   No murmur heard. Pulmonary/Chest: Effort normal. No stridor. No respiratory distress. She has decreased breath sounds in the right lower field and the left lower field. She has no wheezes. She has rhonchi in the right lower field and the left lower field. She has no rales.  Abdominal: Soft. Normal appearance and bowel sounds are normal. She exhibits no distension. There is no tenderness. There is no rebound and no CVA tenderness.  Musculoskeletal: Normal range of motion. She exhibits no edema or tenderness.  Neurological: She is alert and oriented to person, place, and time. She has normal strength. No cranial nerve deficit or sensory deficit. GCS eye subscore is 4. GCS verbal subscore is 5. GCS motor subscore is 6.  Skin: Skin is warm and dry. No abrasion and no rash noted.  Psychiatric: She has a normal mood and affect. Her speech is normal and behavior is normal.  Nursing note and vitals reviewed.    ED Treatments / Results  Labs (all labs ordered are listed, but only abnormal results are displayed) Labs Reviewed  BASIC METABOLIC PANEL - Abnormal; Notable for the following:       Result Value   Glucose, Bld 105 (*)    BUN 22 (*)    Creatinine, Ser 1.45 (*)    GFR calc non Af Amer 30 (*)    GFR calc Af Amer 35 (*)    All other components  within normal limits  CULTURE, BLOOD (ROUTINE X 2)  CULTURE, BLOOD (ROUTINE X 2)  CBC  I-STAT TROPONIN, ED  I-STAT CG4 LACTIC ACID, ED    EKG  EKG Interpretation None       Radiology Dg Chest 2 View  Result Date: 03/05/2017 CLINICAL DATA:  83 year old female with shortness of breath for 3 days. Initial encounter. EXAM: CHEST  2 VIEW COMPARISON:  09/26/2016 and 06/12/2016. FINDINGS: Cardiomegaly. Mild pulmonary edema with bilateral pleural effusions. In the proper clinical setting it would be difficult to exclude left lower lobe infiltrate although findings may be related to atelectasis and pleural effusion.  Calcified mildly tortuous aorta. No acute osseous abnormality. IMPRESSION: Cardiomegaly. Mild pulmonary edema with bilateral small pleural effusions. In the proper clinical setting it would be difficult to exclude left lower lobe infiltrate although findings may be related to atelectasis and pleural effusion. Aortic Atherosclerosis (ICD10-I70.0). Electronically Signed   By: Genia Del M.D.   On: 03/05/2017 12:59    Procedures Procedures (including critical care time)  Medications Ordered in ED Medications  albuterol (PROVENTIL) (2.5 MG/3ML) 0.083% nebulizer solution 5 mg (5 mg Nebulization Given 03/05/17 1153)     Initial Impression / Assessment and Plan / ED Course  I have reviewed the triage vital signs and the nursing notes.  Pertinent labs & imaging results that were available during my care of the patient were reviewed by me and considered in my medical decision making (see chart for details).     Patient is with evidence of CHF versus pneumonia on chest x-ray. BNP is elevated here and she subsequently given succinylcholine as a Lasix IV push. Will be admitted for CHF exacerbation.  Final Clinical Impressions(s) / ED Diagnoses   Final diagnoses:  None    New Prescriptions New Prescriptions   No medications on file     Lacretia Leigh, MD 03/05/17 1516

## 2017-03-05 NOTE — ED Notes (Signed)
Purewick placed on pt. 

## 2017-03-05 NOTE — ED Triage Notes (Signed)
Pt to ER for worsening shortness of breath after being diagnosed with pneumonia yesterday. Also reports worsening fatigue. States was put on levaquin yesterday and has been taking as prescribed. States has been using albuterol inhaler at home without relief. Lungs clear bilaterally. VSS.

## 2017-03-05 NOTE — H&P (Signed)
History and Physical    Katelyn Daniel:[id number] DOB: 06-08-1923 DOA: 03/05/2017  PCP: Katelyn Gaskins, MD Patient coming from: Home  Chief Complaint: SOB  HPI: Katelyn Daniel is a 83 y.o. female with medical history significant of hyperlipidemia, hypertension, thyroid dysfunction. Patient presents with progressive shortness of breath. Much worse w/ ambulation. This started 2-3 weeks prior to admission w/ marked worsening over past few days. On day prior to admission pt was seen at the  urgent care ([facility]) on the day prior to admission and diagnosed with pneumonia. Overnight pt was unable to sleep due to sensation of SOB and needing to sleep on 3-4 pillows throughout the night. Patient endorses occasional productive cough. Lower extremity edema which is at her baseline, and a sensation of fullness in her chest, but denies chest pain, palpitations, nausea, vomiting, fevers, abdominal pain, dysuria, frequency, flank pain, headache, focal neurological deficits. Patient endorses compliance with her home diuretic medication. Denies any recent medication changes.    ED Course: Objective findings outlined below. Lasix 60 mg IV administered 1.  Review of Systems: As per HPI otherwise all other systems reviewed and are negative  Ambulatory Status: Limited secondary to general his physical deconditioning due to age.  Past Medical History:  Diagnosis Date  . Hyperlipemia   . Hypertension   . Iron deficiency anemia   . Thyroid disease     Past Surgical History:  Procedure Laterality Date  . COLONOSCOPY N/A 06/14/2016   Procedure: COLONOSCOPY;  Surgeon: [name], MD;  [facility];  Service: Endoscopy;  Laterality: N/A;  . ESOPHAGOGASTRODUODENOSCOPY N/A 06/14/2016   Procedure: ESOPHAGOGASTRODUODENOSCOPY (EGD);  Surgeon: [name], MD;  Location: [facility];  Service: Endoscopy;  Laterality: N/A;  WITH DILATION   . TONSILLECTOMY    . [facility] SINUS        Social History   Social History  . Marital status: Married    Spouse name: N/A  . Number of children: N/A  . Years of education: N/A   Occupational History  . Not on file.   Social History Main Topics  . Smoking status: Never Smoker  . Smokeless tobacco: Never Used  . Alcohol use No  . Drug use: No  . Sexual activity: Not Currently   Other Topics Concern  . Not on file   Social History Narrative  . No narrative on file    No Known Allergies  Family History  Problem Relation Age of Onset  . Asthma Mother   . Congestive Heart Failure Mother   . Emphysema Father   . Heart Problems Brother   . Colon cancer Neg Hx       Prior to Admission medications   Medication Sig Start Date End Date Taking? Authorizing Provider  acetaminophen (TYLENOL) 500 MG tablet Take 500 mg by mouth every 6 (six) hours as needed for mild pain.   Yes [provider]  albuterol (PROVENTIL) (2.5 MG/3ML) 0.083% nebulizer solution Take 2.5 mg by nebulization 2 (two) times daily.   Yes [provider]  Calcium Citrate-Vitamin D (CALCIUM CITRATE + D PO) Take 1 tablet by mouth daily.   Yes [provider]  cholecalciferol (VITAMIN D) 1000 units tablet Take 1,000 Units by mouth daily.   Yes [provider]  dextromethorphan-guaiFENesin (MUCINEX DM) 30-600 MG 12hr tablet Take 1 tablet by mouth 2 (two) times daily as needed for cough.   Yes [provider]  DiazePAM (VALIUM PO)  Take 1 tablet by mouth at bedtime as needed (for sleep).   Yes [provider]  furosemide (LASIX) 20 MG tablet Take 20 mg by mouth 2 (two) times daily.  01/26/17  Yes [provider]  gabapentin (NEURONTIN) 300 MG capsule Take 300 mg by mouth at bedtime. 06/06/16  Yes [provider]  levofloxacin (LEVAQUIN) 750 MG tablet Take 750 mg by mouth daily. 03/04/17  Yes [provider]  levothyroxine (SYNTHROID, LEVOTHROID) 50 MCG tablet Take 50 mcg by mouth  daily. 06/02/16  Yes [provider]  Multiple Vitamins-Minerals (EMERGEN-C VITAMIN C) PACK Take 1 tablet by mouth daily as needed.   Yes [provider]  Multiple Vitamins-Minerals (PRESERVISION AREDS PO) Take 1 capsule by mouth 2 (two) times daily.    Yes [provider]  naproxen sodium (ANAPROX) 220 MG tablet Take 220 mg by mouth daily as needed (for pain).   Yes [provider]  Omega-3 Fatty Acids (FISH OIL) 1000 MG CAPS Take 1,000 mg by mouth daily.   Yes [provider]  pantoprazole (PROTONIX) 40 MG tablet Take 1 tablet (40 mg total) by mouth 2 (two) times daily before a meal. Patient taking differently: Take 40 mg by mouth daily.  06/15/16  Yes Daniel, Katelyn Lovett, MD  Polysacchar Iron-FA-B12 (FERREX 150 FORTE) 150-1-25 MG-MG-MCG CAPS Take 1 capsule by mouth daily. 09/05/16  Yes Daniel, Katelyn Hilding, PA-C  potassium chloride SA (K-DUR,KLOR-CON) 20 MEQ tablet Take 1 tablet (20 mEq total) by mouth daily. 09/21/16  Yes Katelyn Colonel, NP  pravastatin (PRAVACHOL) 40 MG tablet Take 40 mg by mouth daily. 03/16/16  Yes [provider]  traZODone (DESYREL) 50 MG tablet Take 1 tablet (50 mg total) by mouth at bedtime as needed for sleep. 06/15/16  Yes Daniel, Katelyn Lovett, MD  bisoprolol (ZEBETA) 5 MG tablet Take 1 tablet (5 mg total) by mouth daily. 10/04/16   Katelyn Gaskins, MD    Physical Exam: Vitals:   03/05/17 1800 03/05/17 1815 03/05/17 1830 03/05/17 1849  BP: 114/75 123/72 116/82 128/82  Pulse: (!) 103 (!) 48 (!) 101 (!) 101  Resp:    17  Temp:      TempSrc:      SpO2: 95% 92% 94% 94%     General:  Appears To be in mild distress. Lying in bed.  Eyes:  PERRL, EOMI, normal lids, iris ENT:  grossly normal hearing, lips & tongue, mmm Neck:  no LAD, masses or thyromegaly Cardiovascular:  RRR, no m/r/g. No LE edema.  Respiratory: diminished in bases bilat w/ crackles in bases bilat. Increased effort.  Abdomen:  soft, ntnd, NABS Skin:   no rash or induration seen on limited exam Musculoskeletal:  grossly normal tone BUE/BLE, good ROM, no bony abnormality Psychiatric:  grossly normal mood and affect, speech fluent and appropriate, AOx3 Neurologic:  CN 2-12 grossly intact, moves all extremities in coordinated fashion, sensation intact  Labs on Admission: I have personally reviewed following labs and imaging studies  CBC:  Recent Labs Lab 03/05/17 1145  WBC 7.3  HGB 13.4  HCT 40.3  MCV 98.3  PLT 601   Basic Metabolic Panel:  Recent Labs Lab 03/05/17 1145  NA 138  K 4.0  CL 102  CO2 24  GLUCOSE 105*  BUN 22*  CREATININE 1.45*  CALCIUM 9.0   GFR: CrCl cannot be calculated (Unknown ideal weight.). Liver Function Tests: No results for input(s): AST, ALT, ALKPHOS, BILITOT, PROT, ALBUMIN in the last 168 hours.  No results for input(s): LIPASE, AMYLASE in the last 168 hours. No results for input(s): AMMONIA in the last 168 hours. Coagulation Profile: No results for input(s): INR, PROTIME in the last 168 hours. Cardiac Enzymes: No results for input(s): CKTOTAL, CKMB, CKMBINDEX, TROPONINI in the last 168 hours. BNP (last 3 results) No results for input(s): PROBNP in the last 8760 hours. HbA1C: No results for input(s): HGBA1C in the last 72 hours. CBG: No results for input(s): GLUCAP in the last 168 hours. Lipid Profile: No results for input(s): CHOL, HDL, LDLCALC, TRIG, CHOLHDL, LDLDIRECT in the last 72 hours. Thyroid Function Tests: No results for input(s): TSH, T4TOTAL, FREET4, T3FREE, THYROIDAB in the last 72 hours. Anemia Panel: No results for input(s): VITAMINB12, FOLATE, FERRITIN, TIBC, IRON, RETICCTPCT in the last 72 hours. Urine analysis:    Component Value Date/Time   COLORURINE YELLOW 06/12/2016 [place] 06/12/2016 1042   LABSPEC 1.017 06/12/2016 1042   PHURINE 6.0 06/12/2016 1042   GLUCOSEU NEGATIVE 06/12/2016 1042   HGBUR NEGATIVE 06/12/2016 1042   BILIRUBINUR NEGATIVE  06/12/2016 1042   KETONESUR NEGATIVE 06/12/2016 1042   PROTEINUR NEGATIVE 06/12/2016 1042   NITRITE NEGATIVE 06/12/2016 1042   LEUKOCYTESUR SMALL (A) 06/12/2016 1042    Creatinine Clearance: CrCl cannot be calculated (Unknown ideal weight.).  Sepsis Labs: @LABRCNTIP (procalcitonin:4,lacticidven:4) )No results found for this or any previous visit (from the past 240 hour(s)).   Radiological Exams on Admission: Dg Chest 2 View  Result Date: 03/05/2017 CLINICAL DATA:  83 year old female with shortness of breath for 3 days. Initial encounter. EXAM: CHEST  2 VIEW COMPARISON:  09/26/2016 and 06/12/2016. FINDINGS: Cardiomegaly. Mild pulmonary edema with bilateral pleural effusions. In the proper clinical setting it would be difficult to exclude left lower lobe infiltrate although findings may be related to atelectasis and pleural effusion. Calcified mildly tortuous aorta. No acute osseous abnormality. IMPRESSION: Cardiomegaly. Mild pulmonary edema with bilateral small pleural effusions. In the proper clinical setting it would be difficult to exclude left lower lobe infiltrate although findings may be related to atelectasis and pleural effusion. Aortic Atherosclerosis (ICD10-I70.0). Electronically Signed   By: Genia Del M.D.   On: 03/05/2017 12:59    EKG: Independently reviewed. Sinus tach, no ACS.   Assessment/Plan Active Problems:   Hypothyroidism   Essential hypertension   Hyperlipidemia   Acute on chronic combined systolic and diastolic CHF (congestive heart failure) (HCC)   Lobar pneumonia (HCC)   Acute respiratory failure with hypoxemia (HCC)   Acute respiratory failure: likely multifactorial including decompensated congestive heart failure and pneumonia. O2 saturations on room air in the high 80s, BNP 1510, troponin 0.04, lactic acid 1.69, WBC 7.3, chest x-ray as above. - Treatment for pneumonia and CHF as outlined below  CAP: may be contributing to a lesser degree than the CHF but  cannot exclude at this time and favor treatment given pts.age and overall. PSI pneumonia severity score 121.  - Pneumonia orderset - Ceftriaxone/Azithro - f/u cultures - O2 PRN - Diuresis as below - procalcitonin  Acute on chronic diastolic and systolic CHF: Last echo from 08/22/2016 with an EF of 45% grade on a stalk dysfunction. BNP 1450 with increasing dyspnea on exertion and 3 pillow orthopnea. Chest x-ray concerning for decompensated CHF. Patient reports being started on Lasix 20 mg by mouth a couple months prior to admission. Endorses compliance with this medication. Lasix 60 IV given in ED - CHF orderset - Lasix 20 IV BID (60IV x1 in ED at 14:20  on day of admission. Home dose is 20 PO) - continue bblocker  CKD III: renal decline noted over the past 3 mo w/ Cr 1.45, but prior to 10/2016 baseline Cr was around 1.1. Suspect from deterioration of cardiac function and use of diuretics. No AKI at this time - BMET in am - outpt renal referral at time of DC.   Neuropathy: - contineu Neurontin  HLD: - continue statin  Anxeity/Insomnia: - continue Valium, trazodone  GERD; - contineu protonix  OF NOTE PTS HUSBAND IS ALSO ADMITTED TO THE HOSPITAL IN UNIT 2C W/ SIMILAR MEDICAL ISSUES PER FAMILY.     DVT prophylaxis: Lovenox  Code Status: full  Family Communication: sons  Disposition Plan: pending improvemetn  Consults called: none  Admission status: inpatient    Waldemar Dickens MD [facility]  If 7PM-7AM, please contact night-coverage [email or web address] Password [email or web address]  03/05/2017, 7:03 PM

## 2017-03-06 ENCOUNTER — Inpatient Hospital Stay (HOSPITAL_COMMUNITY): Payer: Medicare Other

## 2017-03-06 LAB — RESPIRATORY PANEL BY PCR
Adenovirus: NOT DETECTED
BORDETELLA PERTUSSIS-RVPCR: NOT DETECTED
CORONAVIRUS HKU1-RVPPCR: NOT DETECTED
Chlamydophila pneumoniae: NOT DETECTED
Coronavirus 229E: NOT DETECTED
Coronavirus NL63: NOT DETECTED
Coronavirus OC43: NOT DETECTED
INFLUENZA B-RVPPCR: NOT DETECTED
Influenza A: NOT DETECTED
METAPNEUMOVIRUS-RVPPCR: NOT DETECTED
Mycoplasma pneumoniae: NOT DETECTED
PARAINFLUENZA VIRUS 2-RVPPCR: NOT DETECTED
PARAINFLUENZA VIRUS 3-RVPPCR: NOT DETECTED
Parainfluenza Virus 1: NOT DETECTED
Parainfluenza Virus 4: NOT DETECTED
RHINOVIRUS / ENTEROVIRUS - RVPPCR: NOT DETECTED
Respiratory Syncytial Virus: NOT DETECTED

## 2017-03-06 LAB — COMPREHENSIVE METABOLIC PANEL
ALBUMIN: 3.3 g/dL — AB (ref 3.5–5.0)
ALT: 27 U/L (ref 14–54)
ANION GAP: 10 (ref 5–15)
AST: 29 U/L (ref 15–41)
Alkaline Phosphatase: 78 U/L (ref 38–126)
BUN: 22 mg/dL — ABNORMAL HIGH (ref 6–20)
CALCIUM: 9.1 mg/dL (ref 8.9–10.3)
CHLORIDE: 102 mmol/L (ref 101–111)
CO2: 25 mmol/L (ref 22–32)
CREATININE: 1.52 mg/dL — AB (ref 0.44–1.00)
GFR calc Af Amer: 33 mL/min — ABNORMAL LOW (ref >60)
GFR calc non Af Amer: 29 mL/min — ABNORMAL LOW (ref >60)
Glucose, Bld: 130 mg/dL — ABNORMAL HIGH (ref 65–99)
POTASSIUM: 3.7 mmol/L (ref 3.5–5.1)
SODIUM: 137 mmol/L (ref 135–145)
Total Bilirubin: 0.9 mg/dL (ref 0.3–1.2)
Total Protein: 6 g/dL — ABNORMAL LOW (ref 6.5–8.1)

## 2017-03-06 LAB — CBC
HCT: 40.3 % (ref 36.0–46.0)
Hemoglobin: 13.5 g/dL (ref 12.0–15.0)
MCH: 32.7 pg (ref 26.0–34.0)
MCHC: 33.5 g/dL (ref 30.0–36.0)
MCV: 97.6 fL (ref 78.0–100.0)
PLATELETS: 306 10*3/uL (ref 150–400)
RBC: 4.13 MIL/uL (ref 3.87–5.11)
RDW: 13.3 % (ref 11.5–15.5)
WBC: 8.5 10*3/uL (ref 4.0–10.5)

## 2017-03-06 LAB — STREP PNEUMONIAE URINARY ANTIGEN: STREP PNEUMO URINARY ANTIGEN: NEGATIVE

## 2017-03-06 MED ORDER — LEVALBUTEROL HCL 0.63 MG/3ML IN NEBU
0.6300 mg | INHALATION_SOLUTION | Freq: Four times a day (QID) | RESPIRATORY_TRACT | Status: DC | PRN
  Administered 2017-03-06: 0.63 mg via RESPIRATORY_TRACT
  Filled 2017-03-06: qty 3

## 2017-03-06 MED ORDER — POTASSIUM CHLORIDE CRYS ER 20 MEQ PO TBCR
20.0000 meq | EXTENDED_RELEASE_TABLET | Freq: Two times a day (BID) | ORAL | Status: DC
  Administered 2017-03-06 – 2017-03-09 (×6): 20 meq via ORAL
  Filled 2017-03-06 (×6): qty 1

## 2017-03-06 MED ORDER — VITAMIN D 1000 UNITS PO TABS
1000.0000 [IU] | ORAL_TABLET | Freq: Every day | ORAL | Status: DC
  Administered 2017-03-06 – 2017-03-09 (×4): 1000 [IU] via ORAL
  Filled 2017-03-06 (×4): qty 1

## 2017-03-06 MED ORDER — LEVALBUTEROL HCL 1.25 MG/0.5ML IN NEBU
1.2500 mg | INHALATION_SOLUTION | Freq: Once | RESPIRATORY_TRACT | Status: AC
  Administered 2017-03-06: 1.25 mg via RESPIRATORY_TRACT
  Filled 2017-03-06: qty 0.5

## 2017-03-06 MED ORDER — LEVOTHYROXINE SODIUM 50 MCG PO TABS
50.0000 ug | ORAL_TABLET | Freq: Every day | ORAL | Status: DC
  Administered 2017-03-07 – 2017-03-09 (×4): 50 ug via ORAL
  Filled 2017-03-06 (×4): qty 1

## 2017-03-06 MED ORDER — ALBUTEROL SULFATE (2.5 MG/3ML) 0.083% IN NEBU
2.5000 mg | INHALATION_SOLUTION | RESPIRATORY_TRACT | Status: DC | PRN
  Administered 2017-03-06 (×3): 2.5 mg via RESPIRATORY_TRACT
  Filled 2017-03-06 (×3): qty 3

## 2017-03-06 MED ORDER — FUROSEMIDE 10 MG/ML IJ SOLN: 40.0000 mg | Freq: Two times a day (BID) | INTRAMUSCULAR | Status: DC

## 2017-03-06 MED ORDER — FUROSEMIDE 10 MG/ML IJ SOLN
20.0000 mg | Freq: Two times a day (BID) | INTRAMUSCULAR | Status: DC
  Administered 2017-03-06 – 2017-03-08 (×5): 20 mg via INTRAVENOUS
  Filled 2017-03-06 (×5): qty 2

## 2017-03-06 NOTE — Evaluation (Signed)
Physical Therapy Evaluation Patient Details Name: Katelyn Daniel MRN: [id number] DOB: 12-08-1923 Today's Date: 03/06/2017   History of Present Illness  Pt is a 83 y.o. female admitted on 03/05/17 with progressive dyspnea and orthopnea (worsening over past 2-3 weeks); on the day PTA, she was dx with pneumonia at [facility] urgent care. Determined to have acute respiratory failure, likely multifactorial including decompensated CHF and pneumonia. Other pertinent PMH includes thyroid disease, HTN, HLD.     Clinical Impression  Pt presents with decreased activity tolerance and an overall decrease in functional mobility secondary to above. PTA, pt mod indep with intermittent use of SPC; she lives at home with husband who has a caregiver 7 days/wk (of note, husband currently admitted). Today, pt able to transfer and amb in room with single HHA; demonstrates increased instability, reaching out for support of furniture with other UE. Further mobility limited secondary to decreased BP (see pertinent vitals/pain). SpO2 remained >90% on RA throughout treatment; RN aware of O2 removal and will monitor, as pt on 2L O2 [place] upon entering room. Pt would benefit from continued acute PT services to maximize functional mobility and independence prior to d/c with HHPT and supervision for mobility. As husband is also admitted, family currently working on plan for d/c and providing adequate amount of support.     Follow Up Recommendations Home health PT;Supervision for mobility/OOB    Equipment Recommendations  None recommended by PT (owns RW)    Recommendations for Other Services       Precautions / Restrictions Precautions Precautions: Fall Precaution Comments: Watch BP Restrictions Weight Bearing Restrictions: No      Mobility  Bed Mobility Overal bed mobility: Needs Assistance Bed Mobility: Supine to Sit     Supine to sit: HOB elevated;Min guard     General bed mobility comments: Min  guard for safety; no physical assist required  Transfers Overall transfer level: Needs assistance Equipment used: 1 person hand held assist Transfers: Sit to/from [facility] Sit to Stand: Min assist Stand pivot transfers: Min assist       General transfer comment: Stood x3 from bed, chair, and bedside commode with HHA and minA to assist trunk into elevation and steady balance  Ambulation/Gait Ambulation/Gait assistance: Min assist Ambulation Distance (Feet): 10 Feet Assistive device: 1 person hand held assist Gait Pattern/deviations: Step-through pattern;Decreased stride length;Trunk flexed Gait velocity: Decreased Gait velocity interpretation: <1.8 ft/sec, indicative of risk for recurrent falls General Gait Details: Amb around bed to chair with single HHA and minA for balance; pt reaching to furniture for support of other UE secondary to unsteadiness. Further mobility limited secondary to decreased BP  Stairs            Wheelchair Mobility    Modified Rankin (Stroke Patients Only)       Balance Overall balance assessment: Needs assistance Sitting-balance support: No upper extremity supported;Feet supported Sitting balance-Leahy Scale: Fair     Standing balance support: Single extremity supported;Bilateral upper extremity supported;During functional activity Standing balance-Leahy Scale: Poor Standing balance comment: Reliant on UE support                              Pertinent Vitals/Pain Pain Assessment: No/denies pain  BP Measurements: Supine 98/62 Sitting 93/69 Standing 76/57 Post-amb to chair 107/72 Sitting ~1 min 83/67 Stand pivot to [facility] 96/68    Home Living Family/patient expects to be discharged to:: Private residence Living Arrangements:  Spouse/significant other Available Help at Discharge: Family;Personal care attendant;Available PRN/intermittently Type of Home: House Home Access: Stairs to enter;Ramped  entrance Entrance Stairs-Rails: None Entrance Stairs-Number of Steps: 1 Home Layout: Able to live on main level with bedroom/bathroom;Laundry or work area in basement (Market researcher to basement) Home Equipment: Kasandra Knudsen - single point;Walker - 4 wheels;Grab bars - toilet;Shower seat - built in Additional Comments: Pt lives with husband who is currently admitted on [place]; has personal care attendant that provides assist for husband from 8AM-4PM 7 days/wk.     Prior Function Level of Independence: Independent with assistive device(s)         Comments: Pt reports mod indep at home with intermittent use of SPC     Hand Dominance        Extremity/Trunk Assessment   Upper Extremity Assessment Upper Extremity Assessment: Generalized weakness    Lower Extremity Assessment Lower Extremity Assessment: Generalized weakness    Cervical / Trunk Assessment Cervical / Trunk Assessment: Kyphotic  Communication   Communication: No difficulties  Cognition Arousal/Alertness: Awake/alert Behavior During Therapy: WFL for tasks assessed/performed Overall Cognitive Status: Within Functional Limits for tasks assessed                                 General Comments: Pt reports asymptomatic throughout session despite drop in BP; potentially distracted secondary to husband being admitted to hospital in same unit      General Comments      Exercises     Assessment/Plan    PT Assessment Patient needs continued PT services  PT Problem List Decreased strength;Decreased range of motion;Decreased activity tolerance;Decreased balance;Decreased mobility;Decreased knowledge of use of DME;Cardiopulmonary status limiting activity       PT Treatment Interventions Gait training;DME instruction;Stair training;Functional mobility training;Therapeutic activities;Therapeutic exercise;Balance training;Patient/family education    PT Goals (Current goals can be found in the Care Plan section)  Acute  Rehab PT Goals Patient Stated Goal: Return home with husband PT Goal Formulation: With patient Time For Goal Achievement: 03/20/17 Potential to Achieve Goals: Good    Frequency Min 3X/week   Barriers to discharge Decreased caregiver support Daughter-in-law present and mentioned potential to hire a 24/7 caregiver    Co-evaluation               AM-PAC PT "6 Clicks" Daily Activity  Outcome Measure Difficulty turning over in bed (including adjusting bedclothes, sheets and blankets)?: A Little Difficulty moving from lying on back to sitting on the side of the bed? : A Little Difficulty sitting down on and standing up from a chair with arms (e.g., wheelchair, bedside commode, etc,.)?: A Little Help needed moving to and from a bed to chair (including a wheelchair)?: A Little Help needed walking in hospital room?: A Little Help needed climbing 3-5 steps with a railing? : A Little 6 Click Score: 18    End of Session Equipment Utilized During Treatment: Gait belt Activity Tolerance: Patient tolerated treatment well;Treatment limited secondary to medical complications (Comment) Patient left: in chair;with call bell/phone within reach;with family/visitor present Nurse Communication: Mobility status;Other (comment) (BP) PT Visit Diagnosis: Unsteadiness on feet (R26.81);Muscle weakness (generalized) (M62.81)    Time: 1023-1050 PT Time Calculation (min) (ACUTE ONLY): 27 min   Charges:   PT Evaluation $PT Eval Moderate Complexity: 1 Mod PT Treatments $Therapeutic Activity: 8-22 mins   PT G Codes:       Mabeline Caras, PT, DPT Acute Rehab  Services  Pager: [place] 03/06/2017, 11:18 AM

## 2017-03-06 NOTE — Care Management Note (Addendum)
Case Management Note  Patient Details  Name: Katelyn Daniel MRN: [id number] Date of Birth: [birth year]  Subjective/Objective:    Pt admitted with PNA                Action/Plan:  PTA independent from home with husband - strong family support.  Unfortunately husbands health has declined to the point that he may not return home.     Expected Discharge Date:  03/07/17               Expected Discharge Plan:  [place]  In-House Referral:     Discharge planning Services  CM Consult  Post Acute Care Choice:    Choice offered to:     DME Arranged:    DME Agency:     HH Arranged:    HH Agency:     Status of Service:     If discussed at [employer], dates discussed:    Additional Comments: Pt remains on IV steriods and IV lasix,   - discussed with attending transfer to lower level of care.  CM attempted to discuss [place] recommendations - family request CM return post Palliative discussion scheduled at 3pm for pts husband at bedside (PTA husband was active with Al[facility] Lemmings) Maryclare Labrador, RN 03/06/2017, 1:55 PM

## 2017-03-06 NOTE — Progress Notes (Signed)
Katelyn Daniel TEAM 1 - Stepdown/ICU TEAM  Katelyn Daniel  OVZ:[id number] DOB: 04-07-1924 DOA: 03/05/2017 PCP: Katelyn Gaskins, MD    Brief Narrative:  83 y.o. female with a history of HLD, HTN, CKD, systolic CHF, and thyroid dysfunction who presented with progressive dyspnea and orthopnea which had been building for 2-3 weeks. The day prior to her admission she was diagnosed w/ "PNA" at [place].   Subjective: The pt tells me she feels much better at this time.  She is much less sob, though not yet back to her baseline.  She denies f/c, n/v, abdom pain, or cp.    Assessment & Plan:  Acute hypoxic resp failure - pulmonary edema due to acute exacerbation of chronic systolic CHF  TTE March 4128 noted EF 45% w/ grade 1 DD - cont IV lasix - follow Is/Os - follow daily weights - net negative ~2L thus far  [employer]   03/05/17 2100  Weight: 63.2 kg (139 lb 5.3 oz)    Possible LLL PNA  procalcitonin is encouraging at < 0.10 - pt is afebrile and WBC is normal - will stop abx after 3 days of tx and follow clinically if stable at that time - if true PNA becomes more of a concern will consider broadening coverage for "HCAP" as pt has been in hospital visiting her sick husband for over a week  CKD Stage 3 Baseline crt ~1.45 - crt climbing w/ attempts to diurese - follow daily   Recent Labs Lab 03/05/17 1145 03/06/17 0126  CREATININE 1.45* 1.52*    HTN BP well controlled   HLD  Hypothyroid  TSH was 3.3 1 month ago - presently elevated at 6.6, but suspect this is due to acute illness - will not change med tx at this time   Chronic anxiety   GERD  DVT prophylaxis: lovenox  Code Status: FULL CODE Family Communication: no family present at time of exam  Disposition Plan: SDU  Consultants:  none  Procedures: none  Antimicrobials:  Azithromycin 10/1 > Rocephin 10/1 >   Objective: Blood pressure 115/79, pulse (!) 103, temperature 97.6 F (36.4 C), temperature  source Oral, resp. rate 16, height 5\' 7"  (1.702 m), weight 63.2 kg (139 lb 5.3 oz), SpO2 97 %.  Intake/Output Summary (Last 24 hours) at 03/06/17 0942 Last data filed at 03/06/17 0400  Gross per 24 hour  Intake              300 ml  Output             2600 ml  Net            -2300 ml   Filed Weights   03/05/17 2100  Weight: 63.2 kg (139 lb 5.3 oz)    Examination: General: No acute respiratory distress at rest on supplemental O2 Lungs: fine diffuse crackles - no wheezing  Cardiovascular: mild tachycardia - no gallup or rub - reg rythm Abdomen: Nontender, nondistended, soft, bowel sounds positive, no rebound, no ascites, no appreciable mass Extremities: trace B LE edema   CBC:  Recent Labs Lab 03/05/17 1145 03/06/17 0126  WBC 7.3 8.5  HGB 13.4 13.5  HCT 40.3 40.3  MCV 98.3 97.6  PLT 288 786   Basic Metabolic Panel:  Recent Labs Lab 03/05/17 1145 03/05/17 2137 03/06/17 0126  NA 138  --  137  K 4.0  --  3.7  CL 102  --  102  CO2 24  --  25  GLUCOSE 105*  --  130*  BUN 22*  --  22*  CREATININE 1.45*  --  1.52*  CALCIUM 9.0  --  9.1  MG  --  2.1  --   PHOS  --  4.0  --    GFR: Estimated Creatinine Clearance: 23.4 mL/min (A) (by C-G formula based on SCr of 1.52 mg/dL (H)).  Liver Function Tests:  Recent Labs Lab 03/06/17 0126  AST 29  ALT 27  ALKPHOS 78  BILITOT 0.9  PROT 6.0*  ALBUMIN 3.3*    Cardiac Enzymes:  Recent Labs Lab 03/05/17 2137  TROPONINI 0.05*    Recent Results (from the past 240 hour(s))  MRSA PCR Screening     Status: None   Collection Time: 03/05/17  8:53 PM  Result Value Ref Range Status   MRSA by PCR NEGATIVE NEGATIVE Final    Comment:        The GeneXpert MRSA Assay (FDA approved for NASAL specimens only), is one component of a comprehensive MRSA colonization surveillance program. It is not intended to diagnose MRSA infection nor to guide or monitor treatment for MRSA infections.      Scheduled Meds: .  bisoprolol  5 mg Oral Daily  . enoxaparin (LOVENOX) injection  30 mg Subcutaneous Q24H  . furosemide  20 mg Intravenous BID  . gabapentin  300 mg Oral QHS  . Influenza vac split quadrivalent PF  0.5 mL Intramuscular Tomorrow-1000  . levothyroxine  50 mcg Oral Daily  . pantoprazole  40 mg Oral Daily  . potassium chloride SA  20 mEq Oral Daily  . pravastatin  40 mg Oral Daily  . sodium chloride flush  3 mL Intravenous Q12H     LOS: 1 day   Cherene Altes, MD [facility] Office  [phone] Pager - Text Page per Amion as per below:  On-Call/Text Page:      [email or web address]      password [email or web address]  If 7PM-7AM, please contact night-coverage [email or web address] Password [email or web address] 03/06/2017, 9:42 AM

## 2017-03-06 NOTE — Progress Notes (Signed)
Pt moved to her spouses hospital room in the chair for a quick visit with spouse and family. Pt on room air at this time, sats=92%

## 2017-03-06 NOTE — Progress Notes (Signed)
OT Cancellation Note  Patient Details Name: AVERYANNA SAX MRN: [id number] DOB: 05-24-1924   Cancelled Treatment:    Reason Eval/Treat Not Completed: Fatigue/lethargy limiting ability to participate. Discussed with RN. Pt just able to get to sleep and RN requests hold so that pt may rest this afternoon. Will check back in am for OT evaluation.  Norman Herrlich, MS OTR/L  Pager: [phone]   Norman Herrlich 03/06/2017, 2:39 PM

## 2017-03-07 ENCOUNTER — Encounter: Payer: Self-pay | Admitting: Gastroenterology

## 2017-03-07 DIAGNOSIS — E038 Other specified hypothyroidism: Secondary | ICD-10-CM

## 2017-03-07 DIAGNOSIS — I5022 Chronic systolic (congestive) heart failure: Secondary | ICD-10-CM

## 2017-03-07 DIAGNOSIS — N183 Chronic kidney disease, stage 3 (moderate): Secondary | ICD-10-CM

## 2017-03-07 LAB — LEGIONELLA PNEUMOPHILA SEROGP 1 UR AG: L. pneumophila Serogp 1 Ur Ag: NEGATIVE

## 2017-03-07 LAB — RESPIRATORY PANEL BY PCR
Adenovirus: NOT DETECTED
BORDETELLA PERTUSSIS-RVPCR: NOT DETECTED
CORONAVIRUS 229E-RVPPCR: NOT DETECTED
CORONAVIRUS HKU1-RVPPCR: NOT DETECTED
Chlamydophila pneumoniae: NOT DETECTED
Coronavirus NL63: NOT DETECTED
Coronavirus OC43: NOT DETECTED
Influenza A: NOT DETECTED
Influenza B: NOT DETECTED
Metapneumovirus: NOT DETECTED
Mycoplasma pneumoniae: NOT DETECTED
Parainfluenza Virus 1: NOT DETECTED
Parainfluenza Virus 2: NOT DETECTED
Parainfluenza Virus 3: NOT DETECTED
Parainfluenza Virus 4: NOT DETECTED
RHINOVIRUS / ENTEROVIRUS - RVPPCR: NOT DETECTED
Respiratory Syncytial Virus: NOT DETECTED

## 2017-03-07 LAB — CBC
HCT: 37.2 % (ref 36.0–46.0)
Hemoglobin: 12.5 g/dL (ref 12.0–15.0)
MCH: 33.1 pg (ref 26.0–34.0)
MCHC: 33.6 g/dL (ref 30.0–36.0)
MCV: 98.4 fL (ref 78.0–100.0)
PLATELETS: 277 10*3/uL (ref 150–400)
RBC: 3.78 MIL/uL — ABNORMAL LOW (ref 3.87–5.11)
RDW: 13.5 % (ref 11.5–15.5)
WBC: 6 10*3/uL (ref 4.0–10.5)

## 2017-03-07 LAB — BASIC METABOLIC PANEL
Anion gap: 11 (ref 5–15)
BUN: 25 mg/dL — ABNORMAL HIGH (ref 6–20)
CHLORIDE: 99 mmol/L — AB (ref 101–111)
CO2: 25 mmol/L (ref 22–32)
CREATININE: 1.63 mg/dL — AB (ref 0.44–1.00)
Calcium: 8.8 mg/dL — ABNORMAL LOW (ref 8.9–10.3)
GFR calc Af Amer: 31 mL/min — ABNORMAL LOW (ref >60)
GFR calc non Af Amer: 26 mL/min — ABNORMAL LOW (ref >60)
Glucose, Bld: 95 mg/dL (ref 65–99)
Potassium: 4.3 mmol/L (ref 3.5–5.1)
Sodium: 135 mmol/L (ref 135–145)

## 2017-03-07 MED ORDER — IPRATROPIUM-ALBUTEROL 0.5-2.5 (3) MG/3ML IN SOLN
3.0000 mL | Freq: Four times a day (QID) | RESPIRATORY_TRACT | Status: DC
  Administered 2017-03-07 – 2017-03-09 (×9): 3 mL via RESPIRATORY_TRACT
  Filled 2017-03-07 (×9): qty 3

## 2017-03-07 MED ORDER — DM-GUAIFENESIN ER 30-600 MG PO TB12
1.0000 | ORAL_TABLET | Freq: Two times a day (BID) | ORAL | Status: DC
  Administered 2017-03-07 – 2017-03-09 (×5): 1 via ORAL
  Filled 2017-03-07 (×5): qty 1

## 2017-03-07 MED ORDER — METHYLPREDNISOLONE SODIUM SUCC 125 MG IJ SOLR
60.0000 mg | Freq: Every day | INTRAMUSCULAR | Status: DC
  Administered 2017-03-07 – 2017-03-08 (×2): 60 mg via INTRAVENOUS
  Filled 2017-03-07 (×2): qty 2

## 2017-03-07 NOTE — Progress Notes (Signed)
Physical Therapy Treatment Patient Details Name: Katelyn Daniel MRN: [id number] DOB: 12-Nov-1923 Today's Date: 03/07/2017    History of Present Illness Pt is a 83 y.o. female admitted on 03/05/17 with progressive dyspnea and orthopnea (worsening over past 2-3 weeks); on the day PTA, she was dx with pneumonia at [facility] urgent care. Determined to have acute respiratory failure, likely multifactorial including decompensated CHF and pneumonia. Other pertinent PMH includes thyroid disease, HTN, HLD.     PT Comments    Pt admitted with above diagnosis. Pt currently with functional limitations due to balance and endurance deficits. Pt was able to ambulate with RW with min assist only mostly cuing.  Pt BP stable today.  Pt limited her walking due to DOE3/4.  HEr sats remained 94% and greater entire walk.  Will continue acute PT.  Pt will benefit from skilled PT to increase their independence and safety with mobility to allow discharge to the venue listed below.     Follow Up Recommendations  Home health PT;Supervision for mobility/OOB     Equipment Recommendations  None recommended by PT (owns RW)    Recommendations for Other Services       Precautions / Restrictions Precautions Precautions: Fall Precaution Comments: Watch BP Restrictions Weight Bearing Restrictions: No    Mobility  Bed Mobility Overal bed mobility: Needs Assistance Bed Mobility: Supine to Sit     Supine to sit: HOB elevated;Supervision     General bed mobility comments: Min guard for safety; no physical assist required  Transfers Overall transfer level: Needs assistance Equipment used: Rolling walker (2 wheeled) Transfers: Sit to/from [facility] Sit to Stand: Min guard;Min assist         General transfer comment: STeadying assist needed.   Ambulation/Gait Ambulation/Gait assistance: Min assist;Min guard Ambulation Distance (Feet): 125 Feet Assistive device: Rolling walker  (2 wheeled) Gait Pattern/deviations: Step-through pattern;Decreased stride length;Trunk flexed;Staggering right;Staggering left Gait velocity: Decreased Gait velocity interpretation: Below normal speed for age/gender General Gait Details: Pt needed occasional steadying assist with ambulation.  No significant LOB but cues to slow downa nd stay close to RW.  Cues for sequencing steps and RW as well.  Overall did well but just needs someone with her until she gets more comfortable with RW.    Stairs            Wheelchair Mobility    Modified Rankin (Stroke Patients Only)       Balance Overall balance assessment: Needs assistance Sitting-balance support: No upper extremity supported;Feet supported Sitting balance-Leahy Scale: Fair     Standing balance support: Bilateral upper extremity supported;During functional activity Standing balance-Leahy Scale: Poor Standing balance comment: Reliant on UE support                             Cognition Arousal/Alertness: Awake/alert Behavior During Therapy: WFL for tasks assessed/performed Overall Cognitive Status: Within Functional Limits for tasks assessed                                        Exercises General Exercises - Lower Extremity Long Arc Quad: AROM;Both;10 reps;Seated Hip Flexion/Marching: AROM;Both;10 reps;Seated    General Comments        Pertinent Vitals/Pain Pain Assessment: No/denies pain  Initial BP 103/73, final BP 101/77.  Pt sats >94% on RA.  HR 85-93.  Home Living                      Prior Function            PT Goals (current goals can now be found in the care plan section) Progress towards PT goals: Progressing toward goals    Frequency    Min 3X/week      PT Plan Current plan remains appropriate    Co-evaluation              AM-PAC PT "6 Clicks" Daily Activity  Outcome Measure  Difficulty turning over in bed (including adjusting  bedclothes, sheets and blankets)?: None Difficulty moving from lying on back to sitting on the side of the bed? : None Difficulty sitting down on and standing up from a chair with arms (e.g., wheelchair, bedside commode, etc,.)?: A Little Help needed moving to and from a bed to chair (including a wheelchair)?: A Little Help needed walking in hospital room?: A Little Help needed climbing 3-5 steps with a railing? : A Little 6 Click Score: 20    End of Session Equipment Utilized During Treatment: Gait belt Activity Tolerance: Patient tolerated treatment well;Treatment limited secondary to medical complications (Comment) Patient left: in chair;with call bell/phone within reach;with chair alarm set Nurse Communication: Mobility status PT Visit Diagnosis: Unsteadiness on feet (R26.81);Muscle weakness (generalized) (M62.81)     Time: 0768-0881 PT Time Calculation (min) (ACUTE ONLY): 18 min  Charges:  $Gait Training: 8-22 mins                    G Codes:       Jaycee Pelzer,PT Acute Rehabilitation [phone] (pager)    Denice Paradise 03/07/2017, 9:02 AM

## 2017-03-07 NOTE — Evaluation (Signed)
Occupational Therapy Evaluation Patient Details Name: Katelyn Daniel MRN: [id number] DOB: January 07, 1924 Today's Date: 03/07/2017    History of Present Illness Pt is a 83 y.o. female admitted on 03/05/17 with progressive dyspnea and orthopnea (worsening over past 2-3 weeks); on the day PTA, she was dx with pneumonia at [facility] urgent [facility]. Determined to have acute respiratory failure, likely multifactorial including decompensated CHF and pneumonia. Other pertinent PMH includes thyroid disease, HTN, HLD.    Clinical Impression   PTA, pt was independent with occasional use of cane for ADL and functional mobility. She currently requires overall supervision for standing ADL and ADL transfers. She presents with decreased activity tolerance for ADL and slight instability on her feet impacting her ability to participate in ADL at [facility]. Pt would benefit from continued OT services while admitted to improve independence with ADL and functional mobility prior to returning home with assistance from her family.     Follow Up Recommendations  No OT follow up;Supervision/Assistance - 24 hour    Equipment Recommendations       Recommendations for Other Services       Precautions / Restrictions Precautions Precautions: Fall Precaution Comments: Watch BP Restrictions Weight Bearing Restrictions: No      Mobility Bed Mobility Overal bed mobility: Needs Assistance Bed Mobility: Sit to Supine       Sit to supine: Supervision   General bed mobility comments: Supervision for safety.  Transfers Overall transfer level: Needs assistance Equipment used: None Transfers: Sit to/from Stand Sit to Stand: Supervision         General transfer comment: Supervision for safety    Balance Overall balance assessment: Needs assistance Sitting-balance support: No upper extremity supported;Feet supported Sitting balance-Leahy Scale: Fair     Standing balance support: Bilateral upper  extremity supported;During functional activity Standing balance-Leahy Scale: Poor Standing balance comment: Reliant on UE support                            ADL either performed or assessed with clinical judgement   ADL Overall ADL's : Needs assistance/impaired Eating/Feeding: Set up;Sitting   Grooming: Supervision/safety;Standing   Upper Body Bathing: Set up;Sitting   Lower Body Bathing: Supervison/ safety;Sit to/from stand   Upper Body Dressing : Set up;Sitting   Lower Body Dressing: Supervision/safety;Sit to/from stand   Toilet Transfer: Min guard;Ambulation Toilet Transfer Details (indicate cue type and reason): would benefit from cane but pt is hesitant to use Toileting- Clothing Manipulation and Hygiene: Supervision/safety;Sit to/from stand       Functional mobility during ADLs: Min guard General ADL Comments: moves quickly making her unsafe at times.     Vision Baseline Vision/History: Wears glasses Wears Glasses: Reading only (only sometimes) Patient Visual Report: No change from baseline Vision Assessment?: No apparent visual deficits     Perception     Praxis      Pertinent Vitals/Pain Pain Assessment: No/denies pain     Hand Dominance     Extremity/Trunk Assessment Upper Extremity Assessment Upper Extremity Assessment: Generalized weakness   Lower Extremity Assessment Lower Extremity Assessment: Generalized weakness       Communication Communication Communication: No difficulties   Cognition Arousal/Alertness: Awake/alert Behavior During Therapy: WFL for tasks assessed/performed Overall Cognitive Status: Within Functional Limits for tasks assessed  General Comments: WFL for basic tasks. Pt with decreased understanding of her deficits at times.   General Comments  momentary O2 desaturation to 87% this session standing at sink and after returning to bed. Rebounded to >93% with pursed lip  breathing. On RA throughout.    Exercises     Shoulder Instructions      Home Living Family/patient expects to be discharged to:: Private residence Living Arrangements: Spouse/significant other Available Help at Discharge: Family;Personal care attendant;Available PRN/intermittently Type of Home: House Home Access: Stairs to enter;Ramped entrance Entrance Stairs-Number of Steps: 1 Entrance Stairs-Rails: None Home Layout: Able to live on main level with bedroom/bathroom;Laundry or work area in basement (Market researcher to basement)     Bathroom Shower/Tub: Occupational psychologist: Handicapped height     Home Equipment: Kasandra Knudsen - single point;Walker - 4 wheels;Grab bars - toilet;Shower seat - built in   Additional Comments: Pt lives with husband who is currently admitted on [address]; has personal care attendant that provides assist for husband from 8AM-4PM 7 days/wk.       Prior Functioning/Environment Level of Independence: Independent with assistive device(s)        Comments: Pt reports mod indep at home with intermittent use of SPC        OT Problem List: Decreased strength;Decreased activity tolerance;Impaired balance (sitting and/or standing);Decreased safety awareness;Decreased knowledge of use of DME or AE;Decreased knowledge of precautions      OT Treatment/Interventions: Self-care/ADL training;Therapeutic exercise;Energy conservation;Therapeutic activities;DME and/or AE instruction;Patient/family education    OT Goals(Current goals can be found in the care plan section) Acute Rehab OT Goals Patient Stated Goal: Return home with husband OT Goal Formulation: With patient Time For Goal Achievement: 03/21/17 Potential to Achieve Goals: Good ADL Goals Pt Will Perform Lower Body Dressing: with modified independence;sit to/from stand Pt Will Transfer to Toilet: with modified independence;ambulating;regular height toilet Pt Will Perform Toileting - Clothing Manipulation  and hygiene: with modified independence;sit to/from stand Pt Will Perform Tub/Shower Transfer: with modified independence;ambulating;Shower transfer;shower seat;rolling walker Additional ADL Goal #1: Pt will verbalize 3 strategies to conserve energy during morning ADL routine independently.   OT Frequency: Min 2X/week   Barriers to D/C:            Co-evaluation              AM-PAC PT "6 Clicks" Daily Activity     Outcome Measure Help from another person eating meals?: None Help from another person taking care of personal grooming?: A Little Help from another person toileting, which includes using toliet, bedpan, or urinal?: A Little Help from another person bathing (including washing, rinsing, drying)?: A Little Help from another person to put on and taking off regular upper body clothing?: None Help from another person to put on and taking off regular lower body clothing?: A Little 6 Click Score: 20   End of Session Nurse Communication: Mobility status  Activity Tolerance: Patient tolerated treatment well Patient left: in bed;with call bell/phone within reach  OT Visit Diagnosis: Unsteadiness on feet (R26.81);Muscle weakness (generalized) (M62.81)                Time: 4081-4481 OT Time Calculation (min): 16 min Charges:  OT General Charges $OT Visit: 1 Visit OT Evaluation $OT Eval Moderate Complexity: 1 Mod G-Codes:     Norman Herrlich, MS OTR/L  Pager: [place] A Catricia Scheerer 03/07/2017, 6:19 PM

## 2017-03-07 NOTE — Progress Notes (Signed)
Visited with patient while she was in the room with her spouse who is down the hall.  Patient says there is no where she would rather be than by his side..she says he would marry her all over again.  Sweet couple who is enjoying sharing stories with one another today.  Chaplain will continue to support both.    03/07/17 1344  Clinical Encounter Type  Visited With Patient and family together

## 2017-03-07 NOTE — Progress Notes (Addendum)
PROGRESS NOTE    Katelyn Daniel  WIO:[id number] DOB: 01-Apr-1924 DOA: 03/05/2017 PCP: Lucia Gaskins, MD   Brief Narrative:  83 y.o. WF PMHx HLD,Thyroid dysfunction. Iron deficiency Anemia, Chronic Systolic CHF, HTN,  Presents with progressive shortness of breath. Much worse w/ ambulation. This started 2-3 weeks prior to admission w/ marked worsening over past few days. On day prior to admission pt was seen at the  urgent care ([facility]) on the day prior to admission and diagnosed with pneumonia. Overnight pt was unable to sleep due to sensation of SOB and needing to sleep on 3-4 pillows throughout the night. Patient endorses occasional productive cough. Lower extremity edema which is at her baseline, and a sensation of fullness in her chest, but denies chest pain, palpitations, nausea, vomiting, fevers, abdominal pain, dysuria, frequency, flank pain, headache, focal neurological deficits. Patient endorses compliance with her home diuretic medication. Denies any recent medication changes.    Subjective: 10/3 A/O 4. Negative CP, positive SOB, positive productive cough (yellow), negative abdominal pain, negative N/V. Last 24 hours afebrile    Assessment & Plan:   Active Problems:   Hypothyroidism   Essential hypertension   Hyperlipidemia   Acute on chronic combined systolic and diastolic CHF (congestive heart failure) (HCC)   Lobar pneumonia (HCC)   Acute respiratory failure with hypoxemia (HCC)  Acute Respiratory Failure with Hypoxia -Multifactorial pulmonary edema,pneumonia, exacerbation chronic systolic CHF. -DuoNeb QID -Flutter valve -Mucinex DM BID -Solu-Medrol 60 mg daily -completed 3 day course antibiotics  LEFT LOWER LOBE  PNA   -continued feeling of SOB (SPO2 95-98% on room air) -Obtain ambulatory SPO2 -PCXR on 10/4 -stop abx after 3 days of tx and follow clinically if stable at that time - if true PNA becomes more of a concern will consider broadening  coverage for "HCAP" as pt has been in hospital visiting her sick husband for over a week   Chronic Systolic CHF -Echocardiogram 08/2016: EF 45% with Grade 1 Diastolic dysfunction -base weight (unknown) -Strict in and out since admission -1.6 L -Daily weight Filed Weights   03/05/17 2100 03/07/17 0527  Weight: 139 lb 5.3 oz (63.2 kg) 139 lb 8.8 oz (63.3 kg)   Essential HTN -Stable  CKD stage III (baseline Cr~1.45   Recent Labs Lab 03/05/17 1145 03/06/17 0126 03/07/17 0223 03/08/17 0207  CREATININE 1.45* 1.52* 1.63* 1.40*      HLD   Hypothyroidism  -10/1 TSH= 6.6 possibly due to acute illness, at may also be secondary to subacute hypothyroidism. Will not make adjustments during patient's acute illness. -PCP will need to recheck in 6-8 weeks   Chronic anxiety    GERD    DVT prophylaxis: Lovenox Code Status: full Family Communication: spoke with sons concerning plan of care Disposition Plan: TBD   Consultants:  None   Procedures/Significant Events:  none   I have personally reviewed and interpreted all radiology studies and my findings are as above.  VENTILATOR SETTINGS: none   Cultures 10/1 blood NGTD 10/1 MRSA by PCR negative 10/3 sputum pending 10/3 Respiratory virus panel pending    Antimicrobials: Anti-infectives    Start     Stop   03/05/17 1915  cefTRIAXone (ROCEPHIN) 1 g in dextrose 5 % 50 mL IVPB     03/07/17 1807   03/05/17 1915  azithromycin (ZITHROMAX) 500 mg in dextrose 5 % 250 mL IVPB     03/07/17 2024      Devices    LINES / TUBES:  Continuous Infusions: . azithromycin Stopped (03/06/17 2122)  . cefTRIAXone (ROCEPHIN)  IV Stopped (03/06/17 2009)     Objective: Vitals:   03/06/17 1922 03/06/17 2316 03/07/17 0346 03/07/17 0527  BP: 103/71 103/67 97/63   Pulse: 86 100 80   Resp: (!) 24 10 18    Temp: (!) 97.5 F (36.4 C) (!) 97.5 F (36.4 C) (!) 97.5 F (36.4 C)   TempSrc: Oral Oral Oral   SpO2: 95% 92% (!)  89%   Weight:    139 lb 8.8 oz (63.3 kg)  Height:        Intake/Output Summary (Last 24 hours) at 03/07/17 0719 Last data filed at 03/06/17 2122  Gross per 24 hour  Intake             1270 ml  Output              600 ml  Net              670 ml   Filed Weights   03/05/17 2100 03/07/17 0527  Weight: 139 lb 5.3 oz (63.2 kg) 139 lb 8.8 oz (63.3 kg)    Examination:  General: A/O 4, positive acute respiratory distress Neck:  Negative scars, masses, torticollis, lymphadenopathy, JVD Lungs: mild diffuse wheezing, negative crackles, mild diminished breath sounds LLL Cardiovascular: Regular rate and rhythm without murmur gallop or rub normal S1 and S2 Abdomen: negative abdominal pain, nondistended, positive soft, bowel sounds, no rebound, no ascites, no appreciable mass Extremities: No significant cyanosis, clubbing, or edema bilateral lower extremities Skin: Negative rashes, lesions, ulcers Psychiatric:  Negative depression, negative anxiety, negative fatigue, negative mania  Central nervous system:  Cranial nerves II through XII intact, tongue/uvula midline, all extremities muscle strength 5/5, sensation intact throughout, negative dysarthria, negative expressive aphasia, negative receptive aphasia.  .     Data Reviewed: Care during the described time interval was provided by me .  I have reviewed this patient's available data, including medical history, events of note, physical examination, and all test results as part of my evaluation.   CBC:  Recent Labs Lab 03/05/17 1145 03/06/17 0126 03/07/17 0223  WBC 7.3 8.5 6.0  HGB 13.4 13.5 12.5  HCT 40.3 40.3 37.2  MCV 98.3 97.6 98.4  PLT 288 306 106   Basic Metabolic Panel:  Recent Labs Lab 03/05/17 1145 03/05/17 2137 03/06/17 0126 03/07/17 0223  NA 138  --  137 135  K 4.0  --  3.7 4.3  CL 102  --  102 99*  CO2 24  --  25 25  GLUCOSE 105*  --  130* 95  BUN 22*  --  22* 25*  CREATININE 1.45*  --  1.52* 1.63*    CALCIUM 9.0  --  9.1 8.8*  MG  --  2.1  --   --   PHOS  --  4.0  --   --    GFR: Estimated Creatinine Clearance: 21.9 mL/min (A) (by C-G formula based on SCr of 1.63 mg/dL (H)). Liver Function Tests:  Recent Labs Lab 03/06/17 0126  AST 29  ALT 27  ALKPHOS 78  BILITOT 0.9  PROT 6.0*  ALBUMIN 3.3*   No results for input(s): LIPASE, AMYLASE in the last 168 hours. No results for input(s): AMMONIA in the last 168 hours. Coagulation Profile: No results for input(s): INR, PROTIME in the last 168 hours. Cardiac Enzymes:  Recent Labs Lab 03/05/17 2137  TROPONINI 0.05*   BNP (last 3 results) No  results for input(s): PROBNP in the last 8760 hours. HbA1C: No results for input(s): HGBA1C in the last 72 hours. CBG: No results for input(s): GLUCAP in the last 168 hours. Lipid Profile: No results for input(s): CHOL, HDL, LDLCALC, TRIG, CHOLHDL, LDLDIRECT in the last 72 hours. Thyroid Function Tests:  Recent Labs  03/05/17 2137  TSH 6.603*   Anemia Panel: No results for input(s): VITAMINB12, FOLATE, FERRITIN, TIBC, IRON, RETICCTPCT in the last 72 hours. Urine analysis:    Component Value Date/Time   COLORURINE YELLOW 06/12/2016 [place] 06/12/2016 1042   LABSPEC 1.017 06/12/2016 1042   PHURINE 6.0 06/12/2016 1042   GLUCOSEU NEGATIVE 06/12/2016 1042   HGBUR NEGATIVE 06/12/2016 1042   BILIRUBINUR NEGATIVE 06/12/2016 1042   KETONESUR NEGATIVE 06/12/2016 1042   PROTEINUR NEGATIVE 06/12/2016 1042   NITRITE NEGATIVE 06/12/2016 1042   LEUKOCYTESUR SMALL (A) 06/12/2016 1042   Sepsis Labs: @LABRCNTIP (procalcitonin:4,lacticidven:4)  ) Recent Results (from the past 240 hour(s))  Culture, blood (Routine X 2) w Reflex to ID Panel     Status: None (Preliminary result)   Collection Time: 03/05/17  1:23 PM  Result Value Ref Range Status   Specimen Description BLOOD RIGHT HAND  Final   Special Requests   Final    BOTTLES DRAWN AEROBIC AND ANAEROBIC Blood  Culture adequate volume   Culture NO GROWTH 1 DAY  Final   Report Status PENDING  Incomplete  Culture, blood (Routine X 2) w Reflex to ID Panel     Status: None (Preliminary result)   Collection Time: 03/05/17  6:10 PM  Result Value Ref Range Status   Specimen Description BLOOD RIGHT ANTECUBITAL  Final   Special Requests   Final    BOTTLES DRAWN AEROBIC AND ANAEROBIC Blood Culture adequate volume   Culture NO GROWTH < 24 HOURS  Final   Report Status PENDING  Incomplete  Respiratory Panel by PCR     Status: None   Collection Time: 03/05/17  8:53 PM  Result Value Ref Range Status   Adenovirus NOT DETECTED NOT DETECTED Final   Coronavirus 229E NOT DETECTED NOT DETECTED Final   Coronavirus HKU1 NOT DETECTED NOT DETECTED Final   Coronavirus NL63 NOT DETECTED NOT DETECTED Final   Coronavirus OC43 NOT DETECTED NOT DETECTED Final   Metapneumovirus NOT DETECTED NOT DETECTED Final   Rhinovirus / Enterovirus NOT DETECTED NOT DETECTED Final   Influenza A NOT DETECTED NOT DETECTED Final   Influenza B NOT DETECTED NOT DETECTED Final   Parainfluenza Virus 1 NOT DETECTED NOT DETECTED Final   Parainfluenza Virus 2 NOT DETECTED NOT DETECTED Final   Parainfluenza Virus 3 NOT DETECTED NOT DETECTED Final   Parainfluenza Virus 4 NOT DETECTED NOT DETECTED Final   Respiratory Syncytial Virus NOT DETECTED NOT DETECTED Final   Bordetella pertussis NOT DETECTED NOT DETECTED Final   Chlamydophila pneumoniae NOT DETECTED NOT DETECTED Final   Mycoplasma pneumoniae NOT DETECTED NOT DETECTED Final  MRSA PCR Screening     Status: None   Collection Time: 03/05/17  8:53 PM  Result Value Ref Range Status   MRSA by PCR NEGATIVE NEGATIVE Final    Comment:        The GeneXpert MRSA Assay (FDA approved for NASAL specimens only), is one component of a comprehensive MRSA colonization surveillance program. It is not intended to diagnose MRSA infection nor to guide or monitor treatment for MRSA infections.           Radiology Studies: X-ray Chest  Pa And Lateral  Result Date: 03/06/2017 CLINICAL DATA:  Pneumonia EXAM: CHEST  2 VIEW COMPARISON:  March 05, 2017 FINDINGS: There are bilateral pleural effusions. There is mild interstitial edema. There is cardiomegaly with pulmonary venous hypertension. There is airspace consolidation in portions of the left lower lobe. There is aortic atherosclerosis. No evident adenopathy. No bone lesions. IMPRESSION: By means indicative of a degree of congestive heart failure airspace consolidation in the left lower lobe likely represent superimposed pneumonia. There is aortic atherosclerosis. Followup PA and lateral chest radiographs recommended in 3-4 weeks following trial of antibiotic therapy to ensure resolution and exclude underlying malignancy. Aortic Atherosclerosis (ICD10-I70.0). Electronically Signed   By: Lowella Grip III M.D.   On: 03/06/2017 08:28   Dg Chest 2 View  Result Date: 03/05/2017 CLINICAL DATA:  83 year old female with shortness of breath for 3 days. Initial encounter. EXAM: CHEST  2 VIEW COMPARISON:  09/26/2016 and 06/12/2016. FINDINGS: Cardiomegaly. Mild pulmonary edema with bilateral pleural effusions. In the proper clinical setting it would be difficult to exclude left lower lobe infiltrate although findings may be related to atelectasis and pleural effusion. Calcified mildly tortuous aorta. No acute osseous abnormality. IMPRESSION: Cardiomegaly. Mild pulmonary edema with bilateral small pleural effusions. In the proper clinical setting it would be difficult to exclude left lower lobe infiltrate although findings may be related to atelectasis and pleural effusion. Aortic Atherosclerosis (ICD10-I70.0). Electronically Signed   By: Genia Del M.D.   On: 03/05/2017 12:59        Scheduled Meds: . bisoprolol  5 mg Oral Daily  . cholecalciferol  1,000 Units Oral Daily  . enoxaparin (LOVENOX) injection  30 mg Subcutaneous Q24H  . furosemide   20 mg Intravenous BID  . gabapentin  300 mg Oral QHS  . Influenza vac split quadrivalent PF  0.5 mL Intramuscular Tomorrow-1000  . levothyroxine  50 mcg Oral QAC breakfast  . pantoprazole  40 mg Oral Daily  . potassium chloride SA  20 mEq Oral BID  . pravastatin  40 mg Oral Daily   Continuous Infusions: . azithromycin Stopped (03/06/17 2122)  . cefTRIAXone (ROCEPHIN)  IV Stopped (03/06/17 2009)     LOS: 2 days    Time spent: 40 minutes    Berneda Piccininni, Geraldo Docker, MD [facility] Pager [phone]   If 7PM-7AM, please contact night-coverage [email or web address] Password [email or web address] 03/07/2017, 7:19 AM

## 2017-03-08 ENCOUNTER — Inpatient Hospital Stay (HOSPITAL_COMMUNITY): Payer: Medicare Other

## 2017-03-08 DIAGNOSIS — J189 Pneumonia, unspecified organism: Secondary | ICD-10-CM

## 2017-03-08 LAB — BASIC METABOLIC PANEL
Anion gap: 12 (ref 5–15)
BUN: 26 mg/dL — ABNORMAL HIGH (ref 6–20)
CHLORIDE: 98 mmol/L — AB (ref 101–111)
CO2: 23 mmol/L (ref 22–32)
Calcium: 8.9 mg/dL (ref 8.9–10.3)
Creatinine, Ser: 1.4 mg/dL — ABNORMAL HIGH (ref 0.44–1.00)
GFR calc non Af Amer: 32 mL/min — ABNORMAL LOW (ref >60)
GFR, EST AFRICAN AMERICAN: 37 mL/min — AB (ref >60)
Glucose, Bld: 175 mg/dL — ABNORMAL HIGH (ref 65–99)
POTASSIUM: 4.8 mmol/L (ref 3.5–5.1)
SODIUM: 133 mmol/L — AB (ref 135–145)

## 2017-03-08 LAB — EXPECTORATED SPUTUM ASSESSMENT W GRAM STAIN, RFLX TO RESP C

## 2017-03-08 LAB — MAGNESIUM: MAGNESIUM: 2.2 mg/dL (ref 1.7–2.4)

## 2017-03-08 LAB — EXPECTORATED SPUTUM ASSESSMENT W REFEX TO RESP CULTURE

## 2017-03-08 MED ORDER — METHYLPREDNISOLONE SODIUM SUCC 125 MG IJ SOLR
60.0000 mg | Freq: Two times a day (BID) | INTRAMUSCULAR | Status: DC
  Administered 2017-03-08 – 2017-03-09 (×2): 60 mg via INTRAVENOUS
  Filled 2017-03-08 (×2): qty 2

## 2017-03-08 MED ORDER — PIPERACILLIN-TAZOBACTAM IN DEX 2-0.25 GM/50ML IV SOLN
2.2500 g | Freq: Three times a day (TID) | INTRAVENOUS | Status: DC
  Administered 2017-03-08 – 2017-03-09 (×2): 2.25 g via INTRAVENOUS
  Filled 2017-03-08 (×3): qty 50

## 2017-03-08 MED ORDER — METHYLPREDNISOLONE SODIUM SUCC 40 MG IJ SOLR: 30.0000 mg | Freq: Every day | INTRAMUSCULAR | Status: DC

## 2017-03-08 MED ORDER — FUROSEMIDE 10 MG/ML IJ SOLN
40.0000 mg | Freq: Two times a day (BID) | INTRAMUSCULAR | Status: DC
  Administered 2017-03-08 – 2017-03-09 (×3): 40 mg via INTRAVENOUS
  Filled 2017-03-08 (×3): qty 4

## 2017-03-08 MED ORDER — IPRATROPIUM-ALBUTEROL 0.5-2.5 (3) MG/3ML IN SOLN
3.0000 mL | Freq: Once | RESPIRATORY_TRACT | Status: AC
  Administered 2017-03-08: 3 mL via RESPIRATORY_TRACT
  Filled 2017-03-08: qty 3

## 2017-03-08 NOTE — Progress Notes (Signed)
Occupational Therapy Treatment Patient Details Name: Katelyn Daniel MRN: [id number] DOB: Feb 01, 1924 Today's Date: 03/08/2017    History of present illness Pt is a 83 y.o. female admitted on 03/05/17 with progressive dyspnea and orthopnea (worsening over past 2-3 weeks); on the day PTA, she was dx with pneumonia at [facility] urgent care. Determined to have acute respiratory failure, likely multifactorial including decompensated CHF and pneumonia. Other pertinent PMH includes thyroid disease, HTN, HLD.    OT comments  Pt is making slow steady progress.   She requires min A for functional transfers, and fatigues after ~10 mins therapeutic activity with DOE 3/4 - 4/4.  She requires long seated rest break to recover, but remained fatigue.  02 sats remained >96% on RA, except brief drop to 85 with DOE 4/4 when fatigued, but rebounded immediately into the 90s once seated.  She and family report she has LT care insurance, and they are working on getting her assist at home.  Due to how quickly she fatigues, she will need 24 hour assist/supervision.  Also recommend use of RW when up as she is at very high risk of falls which worsens with fatigue.  Will continue to follow.   Follow Up Recommendations  Home health OT;Supervision/Assistance - 24 hour    Equipment Recommendations  None recommended by OT (has DME )    Recommendations for Other Services      Precautions / Restrictions Precautions Precautions: Fall Precaution Comments: Watch BP Restrictions Weight Bearing Restrictions: No       Mobility Bed Mobility Overal bed mobility: Needs Assistance Bed Mobility: Sit to Supine     Supine to sit: HOB elevated;Supervision        Transfers Overall transfer level: Needs assistance Equipment used: None Transfers: Sit to/from [facility] Sit to Stand: Supervision Stand pivot transfers: Min assist       General transfer comment: assist to steady     Balance  Overall balance assessment: Needs assistance Sitting-balance support: No upper extremity supported;Feet supported Sitting balance-Leahy Scale: Good     Standing balance support: Single extremity supported;Bilateral upper extremity supported;During functional activity Standing balance-Leahy Scale: Poor Standing balance comment: reliant on UE support.  Pt wanted to attempt activity without RW.  She required min a to steady and as she fatigued her fall risk increased, and she required RW.  Pilar Plate discussion with pt re: need for RW when up as she is at very high risk for falls especially as she fatigues                            ADL either performed or assessed with clinical judgement   ADL Overall ADL's : Needs assistance/impaired                         Toilet Transfer: Min guard;Ambulation;Comfort height toilet;Grab bars;RW   Toileting- Clothing Manipulation and Hygiene: Supervision/safety;Sit to/from stand       Functional mobility during ADLs: Min guard;Rolling walker General ADL Comments: Pt fatigues quickly with activity, and once she is fatigued, has little reserve.  She stood x ~10 mins to speak with MD, she then required long seated rest break.  she ambulated ~15ft and was unable to go further due to fatigue.        Vision       Perception     Praxis      Cognition Arousal/Alertness: Awake/alert  Behavior During Therapy: [facility] for tasks assessed/performed Overall Cognitive Status: Within Functional Limits for tasks assessed                                          Exercises     Shoulder Instructions       General Comments 02 sats remained >96% throughout majority of session; however, as she fatigued, sats dropped briefly to 85% with DOE 4/4.  Sats immediately improved into the 90s upon sitting     Pertinent Vitals/ Pain       Pain Assessment: No/denies pain  Home Living                                           Prior Functioning/Environment              Frequency  Min 2X/week        Progress Toward Goals  OT Goals(current goals can now be found in the care plan section)  Progress towards OT goals: Progressing toward goals     Plan Discharge plan needs to be updated    Co-evaluation                 AM-PAC PT "6 Clicks" Daily Activity     Outcome Measure   Help from another person eating meals?: None Help from another person taking care of personal grooming?: A Little Help from another person toileting, which includes using toliet, bedpan, or urinal?: A Little Help from another person bathing (including washing, rinsing, drying)?: A Little Help from another person to put on and taking off regular upper body clothing?: A Little Help from another person to put on and taking off regular lower body clothing?: A Little 6 Click Score: 19    End of Session Equipment Utilized During Treatment: Rolling walker  OT Visit Diagnosis: Unsteadiness on feet (R26.81);Muscle weakness (generalized) (M62.81)   Activity Tolerance Patient limited by fatigue   Patient Left in chair;with call bell/phone within reach;with family/visitor present   Nurse Communication Mobility status        Time: 1203-1259 OT Time Calculation (min): 56 min  Charges: OT General Charges $OT Visit: 1 Visit OT Treatments $Self Care/Home Management : 8-22 mins $Therapeutic Activity: 38-52 mins  [facility], OTR/L [phone]    Katelyn Daniel M 03/08/2017, 2:19 PM

## 2017-03-08 NOTE — Progress Notes (Signed)
PROGRESS NOTE    Katelyn Daniel  BSW:[id number] DOB: 01-12-1924 DOA: 03/05/2017 PCP: Lucia Gaskins, MD   Brief Narrative:  83 y.o. WF PMHx HLD,Thyroid dysfunction. Iron deficiency Anemia, Chronic Systolic CHF, HTN,  Presents with progressive shortness of breath. Much worse w/ ambulation. This started 2-3 weeks prior to admission w/ marked worsening over past few days. On day prior to admission pt was seen at the  urgent care ([facility]) on the day prior to admission and diagnosed with pneumonia. Overnight pt was unable to sleep due to sensation of SOB and needing to sleep on 3-4 pillows throughout the night. Patient endorses occasional productive cough. Lower extremity edema which is at her baseline, and a sensation of fullness in her chest, but denies chest pain, palpitations, nausea, vomiting, fevers, abdominal pain, dysuria, frequency, flank pain, headache, focal neurological deficits. Patient endorses compliance with her home diuretic medication. Denies any recent medication changes.    Subjective: 10/4 A/O 4, negative CP, negative abdominal pain, negative N/V, positive DOE, positive SOB. Afebrile last 24 hours   Assessment & Plan:   Active Problems:   Hypothyroidism   Essential hypertension   Hyperlipidemia   Acute on chronic combined systolic and diastolic CHF (congestive heart failure) (HCC)   Lobar pneumonia (HCC)   Acute respiratory failure with hypoxemia (HCC)  Acute Respiratory Failure with Hypoxia -Multifactorial pulmonary edema,pneumonia, exacerbation chronic systolic CHF. -DuoNeb QID -Flutter valve -Mucinex DM BID -10/4 increase Solu-Medrol to 60 mg daily -completed 3 day course antibiotics -Patient continues to have DOE, able to ambulate from bed to door at which point must be placed in chair and wheeled back to bed. Extremely weak.  LEFT LOWER LOBE HCAP   -continued feeling of SOB (SPO2 95-98% on room air) -Ambulatory SPO2 pending -PCXR on  10/4: Continue findings of CHF + inflammatory process vs worsening infection.. See results below -10/4 begin HCAP treatment. Given patient is worsening PCXR, continued DOE/SOB and hospital exposure while visiting husband probably prudent to complete 7 day course of antibiotics.   Chronic Systolic CHF -Echocardiogram 08/2016: EF 45% with Grade 1 Diastolic dysfunction -base weight (unknown) -Strict in and out since admission -1.3 L -Daily weight Filed Weights   03/05/17 2100 03/07/17 0527  Weight: 139 lb 5.3 oz (63.2 kg) 139 lb 8.8 oz (63.3 kg)  -Increase Lasix 40 mg BID  Essential HTN -Stable  CKD stage III (baseline Cr~1.45)   Recent Labs Lab 03/05/17 1145 03/06/17 0126 03/07/17 0223 03/08/17 0207  CREATININE 1.45* 1.52* 1.63* 1.40*  -stable     HLD   Hypothyroidism  -10/1 TSH= 6.6 possibly due to acute illness, at may also be secondary to subacute hypothyroidism. Will not make adjustments during patient's acute illness. -PCP will need to recheck in 6-8 weeks   Chronic anxiety    GERD    DVT prophylaxis: Lovenox Code Status: full Family Communication: spoke with sons concerning plan of care Disposition Plan: Discharged to [facility] with husband   Consultants:  None   Procedures/Significant Events:  10/4 PCXR:-CHF with pulmonary edema and left pleural effusion.-Bibasilar airspace disease is present and an inflammatory process cannot be excluded. --Followup PA and lateral chest X-ray is recommended in 3-4 weeks following trial of antibiotic therapy to ensure resolution and exclude underlying malignancy. Overall, findings are worse than on the prior study.    I have personally reviewed and interpreted all radiology studies and my findings are as above.  VENTILATOR SETTINGS: none   Cultures 10/1 blood NGTD  10/1 MRSA by PCR negative 10/3 sputum not acceptable for testing 10/3 Respiratory virus panel negative    Antimicrobials: Anti-infectives     Start     Stop   03/05/17 1915  cefTRIAXone (ROCEPHIN) 1 g in dextrose 5 % 50 mL IVPB     03/07/17 1807   03/05/17 1915  azithromycin (ZITHROMAX) 500 mg in dextrose 5 % 250 mL IVPB     03/07/17 2024      Devices    LINES / TUBES:      Continuous Infusions:    Objective: Vitals:   03/08/17 0220 03/08/17 0303 03/08/17 0734 03/08/17 0744  BP:  115/78  107/64  Pulse:  81    Resp:  17    Temp:  (!) 97.5 F (36.4 C)  (!) 97.4 F (36.3 C)  TempSrc:  Oral  Oral  SpO2: 96% 93% 95%   Weight:      Height:        Intake/Output Summary (Last 24 hours) at 03/08/17 1159 Last data filed at 03/08/17 0744  Gross per 24 hour  Intake              490 ml  Output              325 ml  Net              165 ml   Filed Weights   03/05/17 2100 03/07/17 0527  Weight: 139 lb 5.3 oz (63.2 kg) 139 lb 8.8 oz (63.3 kg)    General: A/O 4, positive acute respiratory distress Neck:  Negative scars, masses, torticollis, lymphadenopathy, JVD Lungs:  negative wheezing, negative crackles, mild diminished breath sounds bibasilar Cardiovascular: Regular rate and rhythm without murmur gallop or rub normal S1 and S2 Abdomen: negative abdominal pain, nondistended, positive soft, bowel sounds, no rebound, no ascites, no appreciable mass Extremities: No significant cyanosis, clubbing, or edema bilateral lower extremities Skin: Negative rashes, lesions, ulcers Psychiatric:  Negative depression, negative anxiety, negative fatigue, negative mania  Central nervous system:  Cranial nerves II through XII intact, tongue/uvula midline, all extremities muscle strength 5/5, sensation intact throughout, negative dysarthria, negative expressive aphasia, negative receptive aphasia.  .     Data Reviewed: Care during the described time interval was provided by me .  I have reviewed this patient's available data, including medical history, events of note, physical examination, and all test results as part of my  evaluation.   CBC:  Recent Labs Lab 03/05/17 1145 03/06/17 0126 03/07/17 0223  WBC 7.3 8.5 6.0  HGB 13.4 13.5 12.5  HCT 40.3 40.3 37.2  MCV 98.3 97.6 98.4  PLT 288 306 532   Basic Metabolic Panel:  Recent Labs Lab 03/05/17 1145 03/05/17 2137 03/06/17 0126 03/07/17 0223 03/08/17 0207  NA 138  --  137 135 133*  K 4.0  --  3.7 4.3 4.8  CL 102  --  102 99* 98*  CO2 24  --  25 25 23   GLUCOSE 105*  --  130* 95 175*  BUN 22*  --  22* 25* 26*  CREATININE 1.45*  --  1.52* 1.63* 1.40*  CALCIUM 9.0  --  9.1 8.8* 8.9  MG  --  2.1  --   --  2.2  PHOS  --  4.0  --   --   --    GFR: Estimated Creatinine Clearance: 25.5 mL/min (A) (by C-G formula based on SCr of 1.4 mg/dL (H)). Liver Function Tests:  Recent Labs Lab 03/06/17 0126  AST 29  ALT 27  ALKPHOS 78  BILITOT 0.9  PROT 6.0*  ALBUMIN 3.3*   No results for input(s): LIPASE, AMYLASE in the last 168 hours. No results for input(s): AMMONIA in the last 168 hours. Coagulation Profile: No results for input(s): INR, PROTIME in the last 168 hours. Cardiac Enzymes:  Recent Labs Lab 03/05/17 2137  TROPONINI 0.05*   BNP (last 3 results) No results for input(s): PROBNP in the last 8760 hours. HbA1C: No results for input(s): HGBA1C in the last 72 hours. CBG: No results for input(s): GLUCAP in the last 168 hours. Lipid Profile: No results for input(s): CHOL, HDL, LDLCALC, TRIG, CHOLHDL, LDLDIRECT in the last 72 hours. Thyroid Function Tests:  Recent Labs  03/05/17 2137  TSH 6.603*   Anemia Panel: No results for input(s): VITAMINB12, FOLATE, FERRITIN, TIBC, IRON, RETICCTPCT in the last 72 hours. Urine analysis:    Component Value Date/Time   COLORURINE YELLOW 06/12/2016 [place] 06/12/2016 1042   LABSPEC 1.017 06/12/2016 1042   PHURINE 6.0 06/12/2016 1042   GLUCOSEU NEGATIVE 06/12/2016 1042   HGBUR NEGATIVE 06/12/2016 1042   BILIRUBINUR NEGATIVE 06/12/2016 1042   KETONESUR NEGATIVE  06/12/2016 1042   PROTEINUR NEGATIVE 06/12/2016 1042   NITRITE NEGATIVE 06/12/2016 1042   LEUKOCYTESUR SMALL (A) 06/12/2016 1042   Sepsis Labs: @LABRCNTIP (procalcitonin:4,lacticidven:4)  ) Recent Results (from the past 240 hour(s))  Culture, blood (Routine X 2) w Reflex to ID Panel     Status: None (Preliminary result)   Collection Time: 03/05/17  1:23 PM  Result Value Ref Range Status   Specimen Description BLOOD RIGHT HAND  Final   Special Requests   Final    BOTTLES DRAWN AEROBIC AND ANAEROBIC Blood Culture adequate volume   Culture NO GROWTH 2 DAYS  Final   Report Status PENDING  Incomplete  Culture, blood (Routine X 2) w Reflex to ID Panel     Status: None (Preliminary result)   Collection Time: 03/05/17  6:10 PM  Result Value Ref Range Status   Specimen Description BLOOD RIGHT ANTECUBITAL  Final   Special Requests   Final    BOTTLES DRAWN AEROBIC AND ANAEROBIC Blood Culture adequate volume   Culture NO GROWTH 2 DAYS  Final   Report Status PENDING  Incomplete  Culture, sputum-assessment     Status: None (Preliminary result)   Collection Time: 03/05/17  7:00 PM  Result Value Ref Range Status   Specimen Description EXPECTORATED SPUTUM  Final   Special Requests NONE  Final   Sputum evaluation   Final    Sputum specimen not acceptable for testing.  Please recollect.   Cruzita Lederer RN 2055 03/07/17 A BROWNING    Report Status PENDING  Incomplete  Respiratory Panel by PCR     Status: None   Collection Time: 03/05/17  8:53 PM  Result Value Ref Range Status   Adenovirus NOT DETECTED NOT DETECTED Final   Coronavirus 229E NOT DETECTED NOT DETECTED Final   Coronavirus HKU1 NOT DETECTED NOT DETECTED Final   Coronavirus NL63 NOT DETECTED NOT DETECTED Final   Coronavirus OC43 NOT DETECTED NOT DETECTED Final   Metapneumovirus NOT DETECTED NOT DETECTED Final   Rhinovirus / Enterovirus NOT DETECTED NOT DETECTED Final   Influenza A NOT DETECTED NOT DETECTED Final   Influenza B  NOT DETECTED NOT DETECTED Final   Parainfluenza Virus 1 NOT DETECTED NOT DETECTED Final   Parainfluenza Virus 2 NOT DETECTED  NOT DETECTED Final   Parainfluenza Virus 3 NOT DETECTED NOT DETECTED Final   Parainfluenza Virus 4 NOT DETECTED NOT DETECTED Final   Respiratory Syncytial Virus NOT DETECTED NOT DETECTED Final   Bordetella pertussis NOT DETECTED NOT DETECTED Final   Chlamydophila pneumoniae NOT DETECTED NOT DETECTED Final   Mycoplasma pneumoniae NOT DETECTED NOT DETECTED Final  MRSA PCR Screening     Status: None   Collection Time: 03/05/17  8:53 PM  Result Value Ref Range Status   MRSA by PCR NEGATIVE NEGATIVE Final    Comment:        The GeneXpert MRSA Assay (FDA approved for NASAL specimens only), is one component of a comprehensive MRSA colonization surveillance program. It is not intended to diagnose MRSA infection nor to guide or monitor treatment for MRSA infections.   Respiratory Panel by PCR     Status: None   Collection Time: 03/07/17  2:39 PM  Result Value Ref Range Status   Adenovirus NOT DETECTED NOT DETECTED Final   Coronavirus 229E NOT DETECTED NOT DETECTED Final   Coronavirus HKU1 NOT DETECTED NOT DETECTED Final   Coronavirus NL63 NOT DETECTED NOT DETECTED Final   Coronavirus OC43 NOT DETECTED NOT DETECTED Final   Metapneumovirus NOT DETECTED NOT DETECTED Final   Rhinovirus / Enterovirus NOT DETECTED NOT DETECTED Final   Influenza A NOT DETECTED NOT DETECTED Final   Influenza B NOT DETECTED NOT DETECTED Final   Parainfluenza Virus 1 NOT DETECTED NOT DETECTED Final   Parainfluenza Virus 2 NOT DETECTED NOT DETECTED Final   Parainfluenza Virus 3 NOT DETECTED NOT DETECTED Final   Parainfluenza Virus 4 NOT DETECTED NOT DETECTED Final   Respiratory Syncytial Virus NOT DETECTED NOT DETECTED Final   Bordetella pertussis NOT DETECTED NOT DETECTED Final   Chlamydophila pneumoniae NOT DETECTED NOT DETECTED Final   Mycoplasma pneumoniae NOT DETECTED NOT DETECTED  Final         Radiology Studies: Dg Chest Port 1 View  Result Date: 03/08/2017 CLINICAL DATA:  Chest tightness and short of breath EXAM: PORTABLE CHEST 1 VIEW COMPARISON:  03/06/2017 FINDINGS: The heart is moderately enlarged. Vascular congestion. Kerley B-lines are seen bilaterally. There is central basilar airspace disease. Left pleural effusion. IMPRESSION: CHF with pulmonary edema and a left pleural effusion. Bibasilar airspace disease is present and an inflammatory process cannot be excluded. Followup PA and lateral chest X-ray is recommended in 3-4 weeks following trial of antibiotic therapy to ensure resolution and exclude underlying malignancy. Overall, findings are worse than on the prior study. Electronically Signed   By: Marybelle Killings M.D.   On: 03/08/2017 07:31        Scheduled Meds: . bisoprolol  5 mg Oral Daily  . cholecalciferol  1,000 Units Oral Daily  . dextromethorphan-guaiFENesin  1 tablet Oral BID  . enoxaparin (LOVENOX) injection  30 mg Subcutaneous Q24H  . furosemide  20 mg Intravenous BID  . gabapentin  300 mg Oral QHS  . Influenza vac split quadrivalent PF  0.5 mL Intramuscular Tomorrow-1000  . ipratropium-albuterol  3 mL Nebulization Q6H  . levothyroxine  50 mcg Oral QAC breakfast  . methylPREDNISolone (SOLU-MEDROL) injection  60 mg Intravenous Daily  . pantoprazole  40 mg Oral Daily  . potassium chloride SA  20 mEq Oral BID  . pravastatin  40 mg Oral Daily   Continuous Infusions:    LOS: 3 days    Time spent: 40 minutes    Linkoln Alkire, Geraldo Docker, MD [facility] Pager  [phone]   If 7PM-7AM, please contact night-coverage [email or web address] Password [email or web address] 03/08/2017, 11:59 AM

## 2017-03-08 NOTE — Progress Notes (Signed)
Pharmacy Antibiotic Note  Katelyn Daniel is a 83 y.o. female admitted on 03/05/2017 with pneumonia.  Pharmacy has been consulted for Zosyn dosing.  Plan: Zosyn 2.25g IV q8h Follow c/s, clinical progression, renal function  Height: 5\' 7"  (170.2 cm) Weight: 141 lb 8.6 oz (64.2 kg) IBW/kg (Calculated) : 61.6  Temp (24hrs), Avg:97.7 F (36.5 C), Min:97.4 F (36.3 C), Max:98.2 F (36.8 C)   Recent Labs Lab 03/05/17 1145 03/05/17 1346 03/06/17 0126 03/07/17 0223 03/08/17 0207  WBC 7.3  --  8.5 6.0  --   CREATININE 1.45*  --  1.52* 1.63* 1.40*  LATICACIDVEN  --  1.69  --   --   --     Estimated Creatinine Clearance: 25.5 mL/min (A) (by C-G formula based on SCr of 1.4 mg/dL (H)).    No Known Allergies  Antimicrobials this admission: Zosyn 10/4 >>   Dose adjustments this admission: n/a  Microbiology results: 10/1 BCx: ngtd 10/1 and 10/3 resp panel: negative  Thank you for allowing pharmacy to be a part of this patient's care.  Katelyn Daniel D. Suhaan Perleberg, PharmD, [place] Clinical Pharmacist [phone] 03/08/2017 9:26 PM

## 2017-03-09 ENCOUNTER — Encounter (HOSPITAL_COMMUNITY): Payer: Self-pay | Admitting: Internal Medicine

## 2017-03-09 DIAGNOSIS — J690 Pneumonitis due to inhalation of food and vomit: Secondary | ICD-10-CM

## 2017-03-09 DIAGNOSIS — N183 Chronic kidney disease, stage 3 unspecified: Secondary | ICD-10-CM | POA: Diagnosis present

## 2017-03-09 DIAGNOSIS — R748 Abnormal levels of other serum enzymes: Secondary | ICD-10-CM

## 2017-03-09 DIAGNOSIS — R778 Other specified abnormalities of plasma proteins: Secondary | ICD-10-CM | POA: Diagnosis present

## 2017-03-09 DIAGNOSIS — K219 Gastro-esophageal reflux disease without esophagitis: Secondary | ICD-10-CM | POA: Diagnosis present

## 2017-03-09 DIAGNOSIS — R7989 Other specified abnormal findings of blood chemistry: Secondary | ICD-10-CM

## 2017-03-09 DIAGNOSIS — R636 Underweight: Secondary | ICD-10-CM | POA: Diagnosis present

## 2017-03-09 DIAGNOSIS — I700 Atherosclerosis of aorta: Secondary | ICD-10-CM | POA: Diagnosis present

## 2017-03-09 DIAGNOSIS — R531 Weakness: Secondary | ICD-10-CM

## 2017-03-09 DIAGNOSIS — F419 Anxiety disorder, unspecified: Secondary | ICD-10-CM | POA: Diagnosis present

## 2017-03-09 DIAGNOSIS — R739 Hyperglycemia, unspecified: Secondary | ICD-10-CM

## 2017-03-09 DIAGNOSIS — T50905A Adverse effect of unspecified drugs, medicaments and biological substances, initial encounter: Secondary | ICD-10-CM

## 2017-03-09 HISTORY — DX: Chronic kidney disease, stage 3 unspecified: N18.30

## 2017-03-09 LAB — BASIC METABOLIC PANEL
Anion gap: 13 (ref 5–15)
BUN: 32 mg/dL — ABNORMAL HIGH (ref 6–20)
CALCIUM: 9.3 mg/dL (ref 8.9–10.3)
CHLORIDE: 97 mmol/L — AB (ref 101–111)
CO2: 25 mmol/L (ref 22–32)
CREATININE: 1.45 mg/dL — AB (ref 0.44–1.00)
GFR, EST AFRICAN AMERICAN: 35 mL/min — AB (ref >60)
GFR, EST NON AFRICAN AMERICAN: 30 mL/min — AB (ref >60)
Glucose, Bld: 179 mg/dL — ABNORMAL HIGH (ref 65–99)
Potassium: 4.9 mmol/L (ref 3.5–5.1)
SODIUM: 135 mmol/L (ref 135–145)

## 2017-03-09 LAB — MAGNESIUM: MAGNESIUM: 2.5 mg/dL — AB (ref 1.7–2.4)

## 2017-03-09 MED ORDER — OMEGA-3-ACID ETHYL ESTERS 1 G PO CAPS
1.0000 g | ORAL_CAPSULE | Freq: Every day | ORAL | Status: DC
  Administered 2017-03-09: 1 g via ORAL
  Filled 2017-03-09: qty 1

## 2017-03-09 MED ORDER — POTASSIUM CHLORIDE CRYS ER 20 MEQ PO TBCR: 20.0000 meq | EXTENDED_RELEASE_TABLET | Freq: Two times a day (BID) | ORAL | Status: DC

## 2017-03-09 MED ORDER — PIPERACILLIN-TAZOBACTAM 3.375 G IVPB
3.3750 g | Freq: Three times a day (TID) | INTRAVENOUS | Status: DC
  Administered 2017-03-09: 3.375 g via INTRAVENOUS
  Filled 2017-03-09 (×3): qty 50

## 2017-03-09 MED ORDER — FE FUMARATE-B12-VIT C-FA-IFC PO CAPS
1.0000 | ORAL_CAPSULE | Freq: Every day | ORAL | Status: DC
  Administered 2017-03-09: 1 via ORAL
  Filled 2017-03-09: qty 1

## 2017-03-09 MED ORDER — IPRATROPIUM-ALBUTEROL 0.5-2.5 (3) MG/3ML IN SOLN: 3.0000 mL | Freq: Four times a day (QID) | RESPIRATORY_TRACT | Status: DC

## 2017-03-09 MED ORDER — PIPERACILLIN-TAZOBACTAM 3.375 G IVPB: 3.3750 g | Freq: Three times a day (TID) | INTRAVENOUS | Status: DC

## 2017-03-09 MED ORDER — OCUVITE-LUTEIN PO CAPS
1.0000 | ORAL_CAPSULE | Freq: Two times a day (BID) | ORAL | Status: DC
  Administered 2017-03-09: 1 via ORAL
  Filled 2017-03-09 (×2): qty 1

## 2017-03-09 MED ORDER — FUROSEMIDE 10 MG/ML IJ SOLN: 40.0000 mg | Freq: Two times a day (BID) | INTRAMUSCULAR | 0 refills | Status: DC

## 2017-03-09 MED ORDER — PNEUMOCOCCAL VAC POLYVALENT 25 MCG/0.5ML IJ INJ
0.5000 mL | INJECTION | Freq: Once | INTRAMUSCULAR | Status: AC
  Administered 2017-03-09: 0.5 mL via INTRAMUSCULAR
  Filled 2017-03-09: qty 0.5

## 2017-03-09 MED ORDER — PROSIGHT PO TABS: 1.0000 | ORAL_TABLET | Freq: Two times a day (BID) | ORAL | Status: DC

## 2017-03-09 MED ORDER — METHYLPREDNISOLONE SODIUM SUCC 125 MG IJ SOLR: 60.0000 mg | Freq: Two times a day (BID) | INTRAMUSCULAR | 0 refills | Status: DC

## 2017-03-09 NOTE — Progress Notes (Signed)
Physical Therapy Treatment Patient Details Name: Katelyn Daniel MRN: [id number] DOB: January 20, 1924 Today's Date: 03/09/2017    History of Present Illness Pt is a 83 y.o. female admitted on 03/05/17 with progressive dyspnea and orthopnea (worsening over past 2-3 weeks); on the day PTA, she was dx with pneumonia at [facility] urgent care. Determined to have acute respiratory failure, likely multifactorial including decompensated CHF and pneumonia. Other pertinent PMH includes thyroid disease, HTN, HLD.     PT Comments    Pt is making progress towards her goals however still is limited by balance and endurance deficits. Pt was able to ambulate with RW with min assist, ambulation limited by 3/4 DoE and O2 desaturation to 85%O2 on RA.  Pt BP stable today.  Will continue acute PT.   Follow Up Recommendations  Supervision for mobility/OOB;SNF     Equipment Recommendations  None recommended by PT (owns RW)    Recommendations for Other Services       Precautions / Restrictions Precautions Precautions: Fall Precaution Comments: Watch BP Restrictions Weight Bearing Restrictions: No    Mobility  Bed Mobility Overal bed mobility: Needs Assistance Bed Mobility: Supine to Sit     Supine to sit: HOB elevated;Supervision     General bed mobility comments: Min guard for safety; no physical assist required  Transfers Overall transfer level: Needs assistance Equipment used: Rolling walker (2 wheeled) Transfers: Sit to/from [facility] Sit to Stand: Min guard         General transfer comment: min guard for safety to RW, vc for keeping walker close when transferring to bed  Ambulation/Gait Ambulation/Gait assistance: Min assist;Min guard Ambulation Distance (Feet): 120 Feet Assistive device: Rolling walker (2 wheeled) Gait Pattern/deviations: Step-through pattern;Decreased stride length;Trunk flexed;Staggering right;Staggering left Gait velocity:  Decreased Gait velocity interpretation: Below normal speed for age/gender General Gait Details: minA for management of RW, pt continues to need cuing for RW management around obstacles and to keep with her when she switches tasks from ambulation to brushing teeth or straightening things in the room      Balance Overall balance assessment: Needs assistance Sitting-balance support: No upper extremity supported;Feet supported Sitting balance-Leahy Scale: Good     Standing balance support: Bilateral upper extremity supported;During functional activity Standing balance-Leahy Scale: Poor Standing balance comment: Reliant on UE support                            Cognition Arousal/Alertness: Awake/alert Behavior During Therapy: WFL for tasks assessed/performed Overall Cognitive Status: Within Functional Limits for tasks assessed                                           General Comments General comments (skin integrity, edema, etc.): SaO2 desaturation on RA with ambulation to 85%O2 immediately rebounded with cues for pursed lipped breathing      Pertinent Vitals/Pain Pain Assessment: No/denies pain           PT Goals (current goals can now be found in the care plan section) Acute Rehab PT Goals PT Goal Formulation: With patient Time For Goal Achievement: 03/20/17 Potential to Achieve Goals: Good Progress towards PT goals: Progressing toward goals    Frequency    Min 3X/week      PT Plan Current plan remains appropriate       AM-PAC PT "  6 Clicks" Daily Activity  Outcome Measure  Difficulty turning over in bed (including adjusting bedclothes, sheets and blankets)?: None Difficulty moving from lying on back to sitting on the side of the bed? : None Difficulty sitting down on and standing up from a chair with arms (e.g., wheelchair, bedside commode, etc,.)?: None Help needed moving to and from a bed to chair (including a wheelchair)?: A  Little Help needed walking in hospital room?: A Little Help needed climbing 3-5 steps with a railing? : A Lot 6 Click Score: 20    End of Session Equipment Utilized During Treatment: Gait belt Activity Tolerance: Patient tolerated treatment well;Treatment limited secondary to medical complications (Comment) Patient left: in chair;with call bell/phone within reach;with chair alarm set Nurse Communication: Mobility status PT Visit Diagnosis: Unsteadiness on feet (R26.81);Muscle weakness (generalized) (M62.81)     Time: 8469-6295 PT Time Calculation (min) (ACUTE ONLY): 15 min  Charges:  $Gait Training: 8-22 mins                    G Codes:       Camellia Popescu B. Migdalia Dk PT, DPT Acute Rehabilitation  [phone] Pager [phone]     [place] 03/09/2017, 5:03 PM

## 2017-03-09 NOTE — Progress Notes (Signed)
Pt being transported via [facility] at this time, cellphone and charger and belongigns sent with son. [facility] notified of pt leaving hospital now. Peripheral iv kept in per [facility] as pt to get iv antibiotic. No complaints at this time.

## 2017-03-09 NOTE — Plan of Care (Signed)
Problem: Physical Regulation: Goal: Will remain free from infection Outcome: Progressing Pt receives zosyn 2.25mg  q 8hr. VSS. POX=98%.

## 2017-03-09 NOTE — Discharge Instructions (Signed)
Healthcare-Associated Pneumonia Healthcare-associated pneumonia is a lung infection that a person can get when in a health care setting or during certain procedures. The infection causes air sacs inside the lungs to fill with pus or fluid. Healthcare-associated pneumonia is usually caused by bacteria that are common in health care settings. These bacteria may be resistant to some antibiotic medicines. What are the causes? This condition is caused by bacteria that get into your lungs. You can get this condition if you:  Breathe in droplets from an infected person's cough or sneeze.  Touch something that an infected person coughed or sneezed on and then touch your mouth, nose, or eyes.  Have a bacterial infection somewhere else in your body, if the bacteria spread to your lungs through your blood.  What increases the risk? This condition is more likely to develop in people who:  Have a disease that weakens their body's defense system (immune system) or their ability to cough out germs.  Are older than age 52.  Having trouble swallowing.  Use a feeding or breathing tube.  Have a cold or the flu.  Have an IV tube inserted in a vein.  Have surgery.  Have a bed sore.  Live in a long-term care facility, such as a nursing home.  Were in the hospital for two or more days in the past 3 months.  Received hemodialysis in the past 30 days.  What are the signs or symptoms? Symptoms of this condition include:  Fever.  Chills.  Cough.  Shortness of breath.  Wheezing or crackling sounds when breathing.  How is this diagnosed? This condition may be diagnosed based on:  Your symptoms.  A chest X-ray.  A measurement of the amount of oxygen in your blood.  How is this treated? This condition is treated with antibiotics. Your health care provider may take a sample of cells (culture) from your throat to determine what type of bacteria is in your lungs and change your antibiotic  based on the results. If you have bacteria in your blood, trouble breathing, or a low oxygen level, you may need to be treated at the hospital. At the hospital, you will be given antibiotics through an IV tube. You may also be given oxygen or breathing treatments. Follow these instructions at home: Medicine  Take your antibiotic medicine as told by your health care provider. Do not stop taking the antibiotic even if you start to feel better.  Take over-the-counter and other prescription medicines only as told by your health care provider. Activity  Rest at home until you feel better.  Return to your normal activities as told by your health care provider. Ask your health care provider what activities are safe for you. General instructions  Drink enough fluid to keep your urine clear or pale yellow.  Do not use any products that contain nicotine or tobacco, such as cigarettes and e-cigarettes. If you need help quitting, ask your health care provider.  Limit alcohol intake to no more than 1 drink per day for nonpregnant women and 2 drinks per day for men. One drink equals 12 oz of beer, 5 oz of wine, or 1 oz of hard liquor.  Keep all follow-up visits as told by your health care provider. This is important. How is this prevented? Actions that I can take To lower your risk of getting this condition again:  Do not smoke. This includes e-cigarettes.  Do not drink too much alcohol.  Keep your immune system healthy  by eating well and getting enough sleep.  Get a flu shot every year (annually).  Get a pneumonia vaccination if: ? You are older than age 41. ? You smoke. ? You have a long-lasting condition like lung disease.  Exercise your lungs by taking deep breaths, walking, and using an incentive spirometer as directed.  Wash your hands often with soap and water. If you cannot get to a sink to wash your hands, use an alcohol-based hand cleaner.  Make sure your health care providers  are washing their hands. If you do not see them wash their hands, ask them to do so.  When you are in a health care facility, avoid touching your eyes, nose, and mouth.  Avoid touching any surface near where people have coughed or sneezed.  Stand away from sick people when they are coughing or sneezing.  Wear a mask if you cannot avoid exposure to people who are sick.  Clean all surfaces often with a disinfectant cleaner, especially if someone is sick at home or work.  Precautions of my health care team Hospitals, nursing homes, and other health care facilities take special care to try to prevent healthcare-associated pneumonia. To do this, your health care team may:  Clean their hands with soap and water or with alcohol-based hand sanitizer before and after seeing patients.  Wear gloves or masks during treatment.  Sanitize medical instruments, tubes, other equipment, and surfaces in patient rooms.  Raise (elevate) the head of your hospital bed so you are not lying flat. The head of the bed may be elevated 30 degrees or more.  Have you sit up and move around as soon as possible after surgery.  Only insert a breathing tube if needed.  Do these things for you if you have a breathing tube: ? Clean the inside of your mouth regularly. ? Remove the breathing tube as soon as it is no longer needed.  Contact a health care provider if:  Your symptoms do not get better or they get worse.  Your symptoms come back after you have finished taking your antibiotics. Get help right away if:  You have trouble breathing.  You have confusion or difficulty thinking. This information is not intended to replace advice given to you by your health care provider. Make sure you discuss any questions you have with your health care provider. Document Released: 10/12/2015 Document Revised: 03/07/2016 Document Reviewed: 02/18/2016 Elsevier Interactive Patient Education  [employer].

## 2017-03-09 NOTE — Discharge Summary (Addendum)
Physician Discharge Summary  Katelyn Daniel IWL:[id number] DOB: 1923/06/27 DOA: 03/05/2017  PCP: Lucia Gaskins, MD  Admit date: 03/05/2017 Discharge date: 03/09/2017  Admitted From: Home Discharge disposition: [facility]   Recommendations for Outpatient Follow-Up:   1. Patient will continue antibiotics and will need an additional 5 days of treatment with Zosyn. 2. Follow-up chest x-ray recommended in 3-4 weeks to exclude underlying malignancy. 3. Monitor for hyperglycemia while on steroids.Wean steroids as respiratory status stabilizes. 4. Follow-up final blood culture results.   Discharge Diagnosis:   Principal Problem:   Acute respiratory failure with hypoxemia (HCC) Active Problems:   Generalized weakness   Hypothyroidism   Essential hypertension   Hyperlipidemia   Iron deficiency anemia   Aspiration pneumonia (HCC)   Acute on chronic combined systolic and diastolic CHF (congestive heart failure) (HCC)   Lobar pneumonia (HCC)   CKD (chronic kidney disease), stage III (HCC)   Elevated troponin   Anxiety   GERD (gastroesophageal reflux disease)   Underweight   Aortic atherosclerosis (HCC)   Hyperglycemia, drug-induced    Discharge Condition: Improved.  Diet recommendation: Low sodium, heart healthy.  Carbohydrate-modified.  Regular.  Wound care: None.   History of Present Illness:   Katelyn Daniel is an 83 y.o. female with a PMH of HLD,Thyroid dysfunction. Iron deficiency Anemia, Chronic Systolic CHF on diuretic therapy, HTN who was admitted 03/05/17 with a 2-3 week history of progressive shortness of breath and 3-4 pillow orthopnea. He was seen in an urgent care and diagnosed with pneumonia.  Hospital Course by Problem:   Principal problem:  Acute Respiratory Failure with Hypoxia, multifactorial with acute exacerbation of CHF and left lower lobar HCAP pneumonia contributory Patient has a known history of grade 1 diastolic dysfunction and an EF of 45%.  Felt to be multifactorial with pulmonary edema, pneumonia, acute exacerbation of chronic systolic CHF contributory. Being managed with diuretics, bronchodilators, Mucinex, Solu-Medrol and a flutter valve. Was on antibiotics for a total of 3 days, and HCAP coverage with Zosyn initiated on 03/08/17 after chest x-ray showed worsening infection. WBC is WNL, lactic acid/Pro calcitonin not elevated and the patient is afebrile. Respiratory virus panel negative. Sputum sample nonacceptable for testing. Blood cultures pending. Continue Lasix 40 mg twice a day, Zosyn and Solu-Medrol.  Active problems:  Hyperglycemia Likely steroid-induced. Recommend monitoring for hyperglycemia while on steroids.  Iron deficiency anemia Continue iron supplementation.  Generalized weakness Being discharged to a rehabilitation facility for ongoing rehabilitation.  Elevated troponin/aortic atherosclerosis  Troponin of 0.05 noted. No follow-up troponins checked. Patient denies chest pain and the mild elevation of troponin was likely from demand ischemia secondary to the principal problem.  Essential HTN Stable on Zebeta.  CKD stage III (baseline Cr~1.45)  Creatinine stable.  HLD Continue Pravachol.  Hypothyroidism  TSH= 6.6 possibly due to acute illness, at may also be secondary to subacute hypothyroidism. Will not make adjustments during patient's acute illness. PCP will need to recheck in 6-8 weeks  Chronic anxiety  Continue when necessary Valium.  GERD Continue PPI.  Underweight Body mass index is 22.13 kg/m.    Medical Consultants:    None.   Discharge Exam:   Vitals:   03/09/17 0739 03/09/17 1300  BP: 129/89   Pulse: 92   Resp:    Temp: (!) 97.3 F (36.3 C)   SpO2: 97% 96%   Vitals:   03/09/17 0333 03/09/17 0500 03/09/17 0739 03/09/17 1300  BP: 101/69  129/89   Pulse: 76  92   Resp: 20     Temp: (!) 97.5 F (36.4 C)  (!) 97.3 F (36.3 C)   TempSrc: Oral  Axillary     SpO2: 98%  97% 96%  Weight:  64.1 kg (141 lb 4.8 oz)    Height:        General exam: Appears calm and comfortable.  Respiratory system: Coarse breath sounds bilaterally. Respiratory effort normal. Cardiovascular system: S1 & S2 heard, RRR. No JVD,  rubs, gallops or clicks. Grade 1/6 systolic ejection murmur. Gastrointestinal system: Abdomen is nondistended, soft and nontender. No organomegaly or masses felt. Normal bowel sounds heard. Central nervous system: Alert and oriented. No focal neurological deficits. Extremities: No clubbing,  or cyanosis. No edema. Skin: No rashes, lesions or ulcers. Psychiatry: Judgement and insight appear normal. Mood & affect appropriate.    The results of significant diagnostics from this hospitalization (including imaging, microbiology, ancillary and laboratory) are listed below for reference.     Procedures and Diagnostic Studies:   X-ray Chest Pa And Lateral  Result Date: 03/06/2017 CLINICAL DATA:  Pneumonia EXAM: CHEST  2 VIEW COMPARISON:  March 05, 2017 FINDINGS: There are bilateral pleural effusions. There is mild interstitial edema. There is cardiomegaly with pulmonary venous hypertension. There is airspace consolidation in portions of the left lower lobe. There is aortic atherosclerosis. No evident adenopathy. No bone lesions. IMPRESSION: By means indicative of a degree of congestive heart failure airspace consolidation in the left lower lobe likely represent superimposed pneumonia. There is aortic atherosclerosis. Followup PA and lateral chest radiographs recommended in 3-4 weeks following trial of antibiotic therapy to ensure resolution and exclude underlying malignancy. Aortic Atherosclerosis (ICD10-I70.0). Electronically Signed   By: Lowella Grip III M.D.   On: 03/06/2017 08:28   Dg Chest 2 View  Result Date: 03/05/2017 CLINICAL DATA:  83 year old female with shortness of breath for 3 days. Initial encounter. EXAM: CHEST  2 VIEW COMPARISON:   09/26/2016 and 06/12/2016. FINDINGS: Cardiomegaly. Mild pulmonary edema with bilateral pleural effusions. In the proper clinical setting it would be difficult to exclude left lower lobe infiltrate although findings may be related to atelectasis and pleural effusion. Calcified mildly tortuous aorta. No acute osseous abnormality. IMPRESSION: Cardiomegaly. Mild pulmonary edema with bilateral small pleural effusions. In the proper clinical setting it would be difficult to exclude left lower lobe infiltrate although findings may be related to atelectasis and pleural effusion. Aortic Atherosclerosis (ICD10-I70.0). Electronically Signed   By: Genia Del M.D.   On: 03/05/2017 12:59     Labs:   Basic Metabolic Panel:  Recent Labs Lab 03/05/17 1145 03/05/17 2137 03/06/17 0126 03/07/17 0223 03/08/17 0207 03/09/17 0233  NA 138  --  137 135 133* 135  K 4.0  --  3.7 4.3 4.8 4.9  CL 102  --  102 99* 98* 97*  CO2 24  --  25 25 23 25   GLUCOSE 105*  --  130* 95 175* 179*  BUN 22*  --  22* 25* 26* 32*  CREATININE 1.45*  --  1.52* 1.63* 1.40* 1.45*  CALCIUM 9.0  --  9.1 8.8* 8.9 9.3  MG  --  2.1  --   --  2.2 2.5*  PHOS  --  4.0  --   --   --   --    GFR Estimated Creatinine Clearance: 24.6 mL/min (A) (by C-G formula based on SCr of 1.45 mg/dL (H)). Liver Function Tests:  Recent Labs Lab 03/06/17 0126  AST 29  ALT 27  ALKPHOS 78  BILITOT 0.9  PROT 6.0*  ALBUMIN 3.3*   CBC:  Recent Labs Lab 03/05/17 1145 03/06/17 0126 03/07/17 0223  WBC 7.3 8.5 6.0  HGB 13.4 13.5 12.5  HCT 40.3 40.3 37.2  MCV 98.3 97.6 98.4  PLT 288 306 277   Cardiac Enzymes:  Recent Labs Lab 03/05/17 2137  TROPONINI 0.05*   Microbiology Recent Results (from the past 240 hour(s))  Culture, blood (Routine X 2) w Reflex to ID Panel     Status: None (Preliminary result)   Collection Time: 03/05/17  1:23 PM  Result Value Ref Range Status   Specimen Description BLOOD RIGHT HAND  Final   Special Requests    Final    BOTTLES DRAWN AEROBIC AND ANAEROBIC Blood Culture adequate volume   Culture NO GROWTH 3 DAYS  Final   Report Status PENDING  Incomplete  Culture, blood (Routine X 2) w Reflex to ID Panel     Status: None (Preliminary result)   Collection Time: 03/05/17  6:10 PM  Result Value Ref Range Status   Specimen Description BLOOD RIGHT ANTECUBITAL  Final   Special Requests   Final    BOTTLES DRAWN AEROBIC AND ANAEROBIC Blood Culture adequate volume   Culture NO GROWTH 3 DAYS  Final   Report Status PENDING  Incomplete  Culture, sputum-assessment     Status: None   Collection Time: 03/05/17  7:00 PM  Result Value Ref Range Status   Specimen Description EXPECTORATED SPUTUM  Final   Special Requests NONE  Final   Sputum evaluation   Final    Sputum specimen not acceptable for testing.  Please recollect.   CALLED Effie Berkshire RN [phone] 03/07/17 A BROWNING    Report Status 03/08/2017 FINAL  Final  Respiratory Panel by PCR     Status: None   Collection Time: 03/05/17  8:53 PM  Result Value Ref Range Status   Adenovirus NOT DETECTED NOT DETECTED Final   Coronavirus 229E NOT DETECTED NOT DETECTED Final   Coronavirus HKU1 NOT DETECTED NOT DETECTED Final   Coronavirus NL63 NOT DETECTED NOT DETECTED Final   Coronavirus OC43 NOT DETECTED NOT DETECTED Final   Metapneumovirus NOT DETECTED NOT DETECTED Final   Rhinovirus / Enterovirus NOT DETECTED NOT DETECTED Final   Influenza A NOT DETECTED NOT DETECTED Final   Influenza B NOT DETECTED NOT DETECTED Final   Parainfluenza Virus 1 NOT DETECTED NOT DETECTED Final   Parainfluenza Virus 2 NOT DETECTED NOT DETECTED Final   Parainfluenza Virus 3 NOT DETECTED NOT DETECTED Final   Parainfluenza Virus 4 NOT DETECTED NOT DETECTED Final   Respiratory Syncytial Virus NOT DETECTED NOT DETECTED Final   Bordetella pertussis NOT DETECTED NOT DETECTED Final   Chlamydophila pneumoniae NOT DETECTED NOT DETECTED Final   Mycoplasma pneumoniae NOT DETECTED NOT  DETECTED Final  MRSA PCR Screening     Status: None   Collection Time: 03/05/17  8:53 PM  Result Value Ref Range Status   MRSA by PCR NEGATIVE NEGATIVE Final    Comment:        The GeneXpert MRSA Assay (FDA approved for NASAL specimens only), is one component of a comprehensive MRSA colonization surveillance program. It is not intended to diagnose MRSA infection nor to guide or monitor treatment for MRSA infections.   Respiratory Panel by PCR     Status: None   Collection Time: 03/07/17  2:39 PM  Result Value Ref Range Status   Adenovirus NOT DETECTED  NOT DETECTED Final   Coronavirus 229E NOT DETECTED NOT DETECTED Final   Coronavirus HKU1 NOT DETECTED NOT DETECTED Final   Coronavirus NL63 NOT DETECTED NOT DETECTED Final   Coronavirus OC43 NOT DETECTED NOT DETECTED Final   Metapneumovirus NOT DETECTED NOT DETECTED Final   Rhinovirus / Enterovirus NOT DETECTED NOT DETECTED Final   Influenza A NOT DETECTED NOT DETECTED Final   Influenza B NOT DETECTED NOT DETECTED Final   Parainfluenza Virus 1 NOT DETECTED NOT DETECTED Final   Parainfluenza Virus 2 NOT DETECTED NOT DETECTED Final   Parainfluenza Virus 3 NOT DETECTED NOT DETECTED Final   Parainfluenza Virus 4 NOT DETECTED NOT DETECTED Final   Respiratory Syncytial Virus NOT DETECTED NOT DETECTED Final   Bordetella pertussis NOT DETECTED NOT DETECTED Final   Chlamydophila pneumoniae NOT DETECTED NOT DETECTED Final   Mycoplasma pneumoniae NOT DETECTED NOT DETECTED Final     Discharge Instructions:    Allergies as of 03/09/2017   No Known Allergies     Medication List    STOP taking these medications   albuterol (2.5 MG/3ML) 0.083% nebulizer solution Commonly known as:  PROVENTIL   furosemide 20 MG tablet Commonly known as:  LASIX Replaced by:  furosemide 10 MG/ML injection   naproxen sodium 220 MG tablet Commonly known as:  ANAPROX     TAKE these medications   acetaminophen 500 MG tablet Commonly known as:   TYLENOL Take 500 mg by mouth every 6 (six) hours as needed for mild pain.   bisoprolol 5 MG tablet Commonly known as:  ZEBETA Take 1 tablet (5 mg total) by mouth daily.   CALCIUM CITRATE + D PO Take 1 tablet by mouth daily.   cholecalciferol 1000 units tablet Commonly known as:  VITAMIN D Take 1,000 Units by mouth daily.   dextromethorphan-guaiFENesin 30-600 MG 12hr tablet Commonly known as:  MUCINEX DM Take 1 tablet by mouth 2 (two) times daily as needed for cough.   Fish Oil 1000 MG Caps Take 1,000 mg by mouth daily.   furosemide 10 MG/ML injection Commonly known as:  LASIX Inject 4 mLs (40 mg total) into the vein 2 (two) times daily. Replaces:  furosemide 20 MG tablet   gabapentin 300 MG capsule Commonly known as:  NEURONTIN Take 300 mg by mouth at bedtime.   ipratropium-albuterol 0.5-2.5 (3) MG/3ML Soln Commonly known as:  DUONEB Take 3 mLs by nebulization every 6 (six) hours.   levothyroxine 50 MCG tablet Commonly known as:  SYNTHROID, LEVOTHROID Take 50 mcg by mouth daily.   methylPREDNISolone sodium succinate 125 mg/2 mL injection Commonly known as:  SOLU-MEDROL Inject 0.96 mLs (60 mg total) into the vein every 12 (twelve) hours.   pantoprazole 40 MG tablet Commonly known as:  PROTONIX Take 1 tablet (40 mg total) by mouth 2 (two) times daily before a meal. What changed:  when to take this   piperacillin-tazobactam 3.375 GM/50ML IVPB Commonly known as:  ZOSYN Inject 50 mLs (3.375 g total) into the vein every 8 (eight) hours.   Polysacchar Iron-FA-B12 150-1-25 MG-MG-MCG Caps Commonly known as:  FERREX 150 FORTE Take 1 capsule by mouth daily.   potassium chloride SA 20 MEQ tablet Commonly known as:  K-DUR,KLOR-CON Take 1 tablet (20 mEq total) by mouth 2 (two) times daily. What changed:  when to take this   pravastatin 40 MG tablet Commonly known as:  PRAVACHOL Take 40 mg by mouth daily.   PRESERVISION AREDS PO Take 1 capsule by mouth 2 (  two) times  daily.   EMERGEN-C VITAMIN C Pack Take 1 tablet by mouth daily as needed.   traZODone 50 MG tablet Commonly known as:  DESYREL Take 1 tablet (50 mg total) by mouth at bedtime as needed for sleep.   VALIUM PO Take 1 tablet by mouth at bedtime as needed (for sleep).      Follow-up Information    [facility] Follow up.   Specialty:  Skilled Nursing Engineer, manufacturing information: [place] [place] [phone]           Time coordinating discharge: 35 minutes.  Signed:  Vrinda Heckstall  Pager [phone] [facility] 03/09/2017, 2:08 PM

## 2017-03-09 NOTE — Care Management Note (Addendum)
Case Management Note  Patient Details  Name: Katelyn Daniel MRN: [id number] Date of Birth: Dec 19, 1923  Subjective/Objective:    Pt admitted with PNA                Action/Plan:  PTA independent from home with husband - strong family support.  Unfortunately husbands health has declined to the point that he may not return home.     Expected Discharge Date:  03/07/17               Expected Discharge Plan:  [place]  In-House Referral:     Discharge planning Services  CM Consult  Post Acute Care Choice:    Choice offered to:     DME Arranged:    DME Agency:     HH Arranged:    HH Agency:     Status of Service:     If discussed at [employer], dates discussed:    Additional Comments: 03/09/17 CM did not provide LTACH referral.  Attending gave LTACH referral directly to [facility] for pt as pt/family was only interested in [facility] facility.   Attending discussed [place] discharge with both pt and family - agreement gained.  Facilitation of discharge to [facility] orchestrated/completed by attending service, unit staff and [facility] liason  03/08/17 Pt remains on IV steriods and IV lasix, [place]  - discussed with attending transfer to lower level of care.  CM attempted to discuss [place] recommendations - family request CM return post Palliative discussion scheduled at 3pm for pts husband at bedside (PTA husband was active with [facility]) Maryclare Labrador, RN 03/09/2017, 8:39 AM

## 2017-03-09 NOTE — Progress Notes (Signed)
Pharmacy Antibiotic Note Katelyn Daniel is a 83 y.o. female admitted on 03/05/2017 with pneumonia. Pharmacy following for Zosyn.   Plan: 1. Adjust Zosyn to 3.375 grams IV every 8 hours (infused over 4 hours) 2. Follow c/s, clinical progression, renal function  Height: 5\' 7"  (170.2 cm) Weight: 141 lb 4.8 oz (64.1 kg) IBW/kg (Calculated) : 61.6  Temp (24hrs), Avg:97.7 F (36.5 C), Min:97.3 F (36.3 C), Max:98.2 F (36.8 C)   Recent Labs Lab 03/05/17 1145 03/05/17 1346 03/06/17 0126 03/07/17 0223 03/08/17 0207 03/09/17 0233  WBC 7.3  --  8.5 6.0  --   --   CREATININE 1.45*  --  1.52* 1.63* 1.40* 1.45*  LATICACIDVEN  --  1.69  --   --   --   --     Estimated Creatinine Clearance: 24.6 mL/min (A) (by C-G formula based on SCr of 1.45 mg/dL (H)).    No Known Allergies  Antimicrobials this admission: Zosyn 10/4 >>   Microbiology results: 10/1 BCx: ngtd 10/1 and 10/3 resp panel: negative 10/1 MRSA PCR: negative   Thank you for allowing pharmacy to be a part of this patient's care.  Vincenza Hews, PharmD, BCPS 03/09/2017, 8:35 AM

## 2017-03-09 NOTE — Progress Notes (Signed)
Pt waiting on [facility] to go to [facility] room [facility], report called to princess at [facility]. Discharge instructions discussed and explained with son. belongigns going with family.

## 2017-03-10 LAB — CULTURE, BLOOD (ROUTINE X 2)
CULTURE: NO GROWTH
Culture: NO GROWTH
SPECIAL REQUESTS: ADEQUATE
SPECIAL REQUESTS: ADEQUATE

## 2017-03-14 ENCOUNTER — Ambulatory Visit (HOSPITAL_COMMUNITY): Payer: Medicare Other | Admitting: Adult Health

## 2017-03-14 ENCOUNTER — Other Ambulatory Visit (HOSPITAL_COMMUNITY): Payer: Medicare Other

## 2017-03-15 ENCOUNTER — Ambulatory Visit (HOSPITAL_COMMUNITY): Payer: Medicare Other | Admitting: Adult Health

## 2017-03-15 ENCOUNTER — Other Ambulatory Visit (HOSPITAL_COMMUNITY): Payer: Medicare Other

## 2017-04-06 ENCOUNTER — Encounter (HOSPITAL_BASED_OUTPATIENT_CLINIC_OR_DEPARTMENT_OTHER): Payer: Medicare Other | Admitting: Oncology

## 2017-04-06 ENCOUNTER — Encounter (HOSPITAL_COMMUNITY): Payer: Self-pay | Admitting: Oncology

## 2017-04-06 ENCOUNTER — Encounter (HOSPITAL_COMMUNITY): Payer: Medicare Other | Attending: Adult Health

## 2017-04-06 VITALS — BP 113/66 | HR 81 | Temp 98.2°F | Resp 18 | Wt 141.0 lb

## 2017-04-06 DIAGNOSIS — K922 Gastrointestinal hemorrhage, unspecified: Secondary | ICD-10-CM

## 2017-04-06 DIAGNOSIS — N183 Chronic kidney disease, stage 3 unspecified: Secondary | ICD-10-CM

## 2017-04-06 DIAGNOSIS — Z825 Family history of asthma and other chronic lower respiratory diseases: Secondary | ICD-10-CM | POA: Insufficient documentation

## 2017-04-06 DIAGNOSIS — E030 Congenital hypothyroidism with diffuse goiter: Secondary | ICD-10-CM | POA: Diagnosis not present

## 2017-04-06 DIAGNOSIS — Z79899 Other long term (current) drug therapy: Secondary | ICD-10-CM | POA: Insufficient documentation

## 2017-04-06 DIAGNOSIS — D500 Iron deficiency anemia secondary to blood loss (chronic): Secondary | ICD-10-CM

## 2017-04-06 DIAGNOSIS — J9601 Acute respiratory failure with hypoxia: Secondary | ICD-10-CM

## 2017-04-06 DIAGNOSIS — Z8249 Family history of ischemic heart disease and other diseases of the circulatory system: Secondary | ICD-10-CM | POA: Diagnosis not present

## 2017-04-06 DIAGNOSIS — I10 Essential (primary) hypertension: Secondary | ICD-10-CM

## 2017-04-06 DIAGNOSIS — I130 Hypertensive heart and chronic kidney disease with heart failure and stage 1 through stage 4 chronic kidney disease, or unspecified chronic kidney disease: Secondary | ICD-10-CM | POA: Insufficient documentation

## 2017-04-06 DIAGNOSIS — Z79890 Hormone replacement therapy (postmenopausal): Secondary | ICD-10-CM | POA: Diagnosis not present

## 2017-04-06 DIAGNOSIS — I5043 Acute on chronic combined systolic (congestive) and diastolic (congestive) heart failure: Secondary | ICD-10-CM

## 2017-04-06 DIAGNOSIS — R531 Weakness: Secondary | ICD-10-CM

## 2017-04-06 DIAGNOSIS — N189 Chronic kidney disease, unspecified: Secondary | ICD-10-CM

## 2017-04-06 DIAGNOSIS — E785 Hyperlipidemia, unspecified: Secondary | ICD-10-CM | POA: Diagnosis not present

## 2017-04-06 LAB — CBC WITH DIFFERENTIAL/PLATELET
BASOS ABS: 0 10*3/uL (ref 0.0–0.1)
Basophils Relative: 0 %
EOS ABS: 0.2 10*3/uL (ref 0.0–0.7)
EOS PCT: 3 %
HCT: 40.2 % (ref 36.0–46.0)
Hemoglobin: 13.4 g/dL (ref 12.0–15.0)
Lymphocytes Relative: 35 %
Lymphs Abs: 2 10*3/uL (ref 0.7–4.0)
MCH: 33.2 pg (ref 26.0–34.0)
MCHC: 33.3 g/dL (ref 30.0–36.0)
MCV: 99.5 fL (ref 78.0–100.0)
MONO ABS: 0.4 10*3/uL (ref 0.1–1.0)
Monocytes Relative: 7 %
Neutro Abs: 3.1 10*3/uL (ref 1.7–7.7)
Neutrophils Relative %: 55 %
PLATELETS: 339 10*3/uL (ref 150–400)
RBC: 4.04 MIL/uL (ref 3.87–5.11)
RDW: 13 % (ref 11.5–15.5)
WBC: 5.6 10*3/uL (ref 4.0–10.5)

## 2017-04-06 LAB — COMPREHENSIVE METABOLIC PANEL
ALT: 26 U/L (ref 14–54)
AST: 23 U/L (ref 15–41)
Albumin: 3.7 g/dL (ref 3.5–5.0)
Alkaline Phosphatase: 101 U/L (ref 38–126)
Anion gap: 10 (ref 5–15)
BUN: 32 mg/dL — ABNORMAL HIGH (ref 6–20)
CALCIUM: 8.9 mg/dL (ref 8.9–10.3)
CHLORIDE: 100 mmol/L — AB (ref 101–111)
CO2: 29 mmol/L (ref 22–32)
Creatinine, Ser: 1.52 mg/dL — ABNORMAL HIGH (ref 0.44–1.00)
GFR, EST AFRICAN AMERICAN: 33 mL/min — AB (ref >60)
GFR, EST NON AFRICAN AMERICAN: 29 mL/min — AB (ref >60)
Glucose, Bld: 84 mg/dL (ref 65–99)
POTASSIUM: 3.9 mmol/L (ref 3.5–5.1)
SODIUM: 139 mmol/L (ref 135–145)
TOTAL PROTEIN: 6.5 g/dL (ref 6.5–8.1)
Total Bilirubin: 0.7 mg/dL (ref 0.3–1.2)

## 2017-04-06 NOTE — Patient Instructions (Signed)
[facility] Discharge Instructions  RECOMMENDATIONS MADE BY THE CONSULTANT AND ANY TEST RESULTS WILL BE SENT TO YOUR REFERRING PHYSICIAN.  Labs completed today.  Iron tests are pending.  Hemoglobin today is normal.   Labs in 3 months and 6 months to check iron labs. Continue taking oral iron tablets daily. We recommend you take oral iron tablets with Vitamin C. Return in 6 months for follow-up.  Thank you for choosing [place] at [facility] to provide your oncology and hematology care.  To afford each patient quality time with our provider, please arrive at least 15 minutes before your scheduled appointment time.    If you have a lab appointment with the [facility] please come in thru the  Main Entrance and check in at the main information desk  You need to re-schedule your appointment should you arrive 10 or more minutes late.  We strive to give you quality time with our providers, and arriving late affects you and other patients whose appointments are after yours.  Also, if you no show three or more times for appointments you may be dismissed from the clinic at the providers discretion.     Again, thank you for choosing [facility].  Our hope is that these requests will decrease the amount of time that you wait before being seen by our physicians.       _____________________________________________________________  Should you have questions after your visit to [facility], please contact our office at [phone] between the hours of 8:30 a.m. and 4:30 p.m.  Voicemails left after 4:30 p.m. will not be returned until the following business day.  For prescription refill requests, have your pharmacy contact our office.       Resources For Cancer Patients and their Caregivers ? American Cancer Society: Can assist with transportation, wigs, general needs, runs Look Good Feel Better.         (289)291-2473 ? Cancer Care: Provides financial assistance, online support groups, medication/co-pay assistance.  1-800-813-HOPE (402) 362-6345) ? [place] Assists [place] cancer patients and their families through emotional , educational and financial support.  409 466 3074 ? [place] DSS Where to apply for food stamps, Medicaid and utility assistance. 570-717-0661 ? [facility]: Transportation to medical appointments. 262-544-7615 ? Social Security Administration: May apply for disability if have a Stage IV cancer. 6022624241 272-496-8786 ? [employer], Disability and Transit Services: Assists with nutrition, care and transit needs. [place] Support Programs: @10RELATIVEDAYS @ > Cancer Support Group  2nd Tuesday of the month 1pm-2pm, Journey Room  > Creative Journey  3rd Tuesday of the month 1130am-1pm, Journey Room  > Look Good Feel Better  1st Wednesday of the month 10am-12 noon, Journey Room (Call [place] to register [phone])

## 2017-04-06 NOTE — Assessment & Plan Note (Addendum)
Iron deficiency anemia, secondary to chronic GI blood loss with EGD/colonoscopy in 06/2016 demonstrating gastric erosions.  She required IV iron with positive response to therapy.  She is currently on oral iron therapy with Vitamin C.  Absorption possibly hindered secondary to chronic PPI use.  She still requires intermittent IV iron based upon iron calculation deficit.  Given her chronic renal disease, she is a candidate for lower doses of IV iron including ferric gluconate and Feraheme.  Labs today: CBC diff, CMET, iron/TIBC, ferritin.  I personally reviewed and went over laboratory results with the patient.  The results are noted within this dictation.  Hemoglobin is WNL, so too is her platelet count and WBC.  Renal function panel confirms stable chronic renal disease.  Labs in 3 months: CBC diff, iron/TIBC, ferritin. Labs in 6 months: CBC diff, BMET, iron/TIBC, ferritin.  Return in 6 months for follow-up.  Will provide IV iron when indicated.  Continue PO iron with Vitamin C.

## 2017-04-06 NOTE — Progress Notes (Signed)
Katelyn Gaskins, MD [place] [address]  Iron deficiency anemia due to chronic blood loss - Plan: CBC with Differential, Iron and TIBC, Ferritin, CBC with Differential, Basic metabolic panel, Iron and TIBC, Ferritin  CKD (chronic kidney disease), stage III ([place]) - Plan: Basic metabolic panel  Acute on chronic combined systolic and diastolic CHF (congestive heart failure) (HCC)  Acute respiratory failure with hypoxemia (HCC)  Essential hypertension  Congenital hypothyroidism with diffuse goiter  Generalized weakness  CURRENT THERAPY: Poly-iron PO daily with Vitamin C and intermittent IV iron when indicated based upon iron deficit.  INTERVAL HISTORY: Katelyn Daniel 83 y.o. female returns for followup of iron deficiency anemia secondary to chronic GI blood loss, on oral iron therapy, but requiring IV iron periodically. She has recovered from her hospitalization in October 2018.  She reports ongoing fatigue and SOB on exertion.  This is multifactorial from her chronic ailments.  Her hemoglobin today is normal.  She denies any blood in her stools or dark stools.  She denies any gross hematuria.  She denies any hemoptysis.  She rates her appetite at 100%, but her energy level at 50%.  Weight is stable.  HPI Elements   Location: Blood  Quality: Iron deficiency  Severity: Moderate  Duration: Dx in 06/2016  Context: EGD/Colonoscopy in 06/2016 demonstrating gastric erosions  Timing: IV iron in 07/2016 and 12/2016.  Modifying Factors: Chronic renal disease  Associated Signs & Symptoms:     Review of Systems  Constitutional: Positive for malaise/fatigue. Negative for chills, fever and weight loss.  HENT: Negative.   Eyes: Negative.   Respiratory: Positive for shortness of breath. Negative for cough.   Cardiovascular: Negative.  Negative for chest pain.  Gastrointestinal: Negative.  Negative for blood in stool, constipation, diarrhea, melena, nausea and  vomiting.  Genitourinary: Negative.   Musculoskeletal: Negative.   Skin: Negative.   Neurological: Positive for weakness.  Endo/Heme/Allergies: Negative.   Psychiatric/Behavioral: Negative.     Past Medical History:  Diagnosis Date  . Candida esophagitis ([place])   . CKD (chronic kidney disease), stage III ([place]) 03/09/2017  . Hyperlipemia   . Hypertension   . Iron deficiency anemia   . Thyroid disease     Past Surgical History:  Procedure Laterality Date  . COLONOSCOPY N/A 06/14/2016   Procedure: COLONOSCOPY;  Surgeon: [name], MD;  [facility];  Service: Endoscopy;  Laterality: N/A;  . ESOPHAGOGASTRODUODENOSCOPY N/A 06/14/2016   Procedure: ESOPHAGOGASTRODUODENOSCOPY (EGD);  Surgeon: [name], MD;  Location: [facility];  Service: Endoscopy;  Laterality: N/A;  WITH DILATION   . TONSILLECTOMY    . [facility] SINUS      Family History  Problem Relation Age of Onset  . Asthma Mother   . Congestive Heart Failure Mother   . Emphysema Father   . Heart Problems Brother   . Colon cancer Neg Hx     Social History   Social History  . Marital status: Married    Spouse name: N/A  . Number of children: N/A  . Years of education: N/A   Social History Main Topics  . Smoking status: Never Smoker  . Smokeless tobacco: Never Used  . Alcohol use No  . Drug use: No  . Sexual activity: Not Currently   Other Topics Concern  . None   Social History Narrative  . None     PHYSICAL EXAMINATION  ECOG PERFORMANCE STATUS: 1 -  Symptomatic but completely ambulatory  Vitals:   04/06/17 1315  BP: 113/66  Pulse: 81  Resp: 18  Temp: 98.2 F (36.8 C)  SpO2: 96%    GENERAL:alert, no distress, well nourished, well developed, comfortable, cooperative and smiling SKIN: skin color, texture, turgor are normal, no rashes or significant lesions HEAD: Normocephalic, No masses, lesions, tenderness or abnormalities EYES: normal, EOMI, Conjunctiva are pink and  non-injected EARS: External ears normal OROPHARYNX:lips, buccal mucosa, and tongue normal and mucous membranes are moist  NECK: supple, no adenopathy, trachea midline LYMPH:  no palpable lymphadenopathy BREAST:not examined LUNGS: clear to auscultation , decreased breath sounds in the right lower base. HEART: regular rate & rhythm ABDOMEN:normal bowel sounds BACK: Back symmetric, no curvature. EXTREMITIES:less then 2 second capillary refill, no joint deformities, effusion, or inflammation, no skin discoloration, no cyanosis  NEURO: alert & oriented x 3 with fluent speech, no focal motor/sensory deficits, gait normal   LABORATORY DATA: CBC    Component Value Date/Time   WBC 5.6 04/06/2017 1154   RBC 4.04 04/06/2017 1154   HGB 13.4 04/06/2017 1154   HCT 40.2 04/06/2017 1154   PLT 339 04/06/2017 1154   MCV 99.5 04/06/2017 1154   MCH 33.2 04/06/2017 1154   MCHC 33.3 04/06/2017 1154   RDW 13.0 04/06/2017 1154   LYMPHSABS 2.0 04/06/2017 1154   MONOABS 0.4 04/06/2017 1154   EOSABS 0.2 04/06/2017 1154   BASOSABS 0.0 04/06/2017 1154      Chemistry      Component Value Date/Time   NA 139 04/06/2017 1154   K 3.9 04/06/2017 1154   CL 100 (L) 04/06/2017 1154   CO2 29 04/06/2017 1154   BUN 32 (H) 04/06/2017 1154   CREATININE 1.52 (H) 04/06/2017 1154   CREATININE 1.37 (H) 02/08/2017 1111      Component Value Date/Time   CALCIUM 8.9 04/06/2017 1154   ALKPHOS 101 04/06/2017 1154   AST 23 04/06/2017 1154   ALT 26 04/06/2017 1154   BILITOT 0.7 04/06/2017 1154     Lab Results  Component Value Date   IRON 116 12/05/2016   TIBC 332 12/05/2016   FERRITIN 38 12/05/2016      PENDING LABS:   RADIOGRAPHIC STUDIES:  Dg Chest Port 1 View  Result Date: 03/08/2017 CLINICAL DATA:  Chest tightness and short of breath EXAM: PORTABLE CHEST 1 VIEW COMPARISON:  03/06/2017 FINDINGS: The heart is moderately enlarged. Vascular congestion. Kerley B-lines are seen bilaterally. There is  central basilar airspace disease. Left pleural effusion. IMPRESSION: CHF with pulmonary edema and a left pleural effusion. Bibasilar airspace disease is present and an inflammatory process cannot be excluded. Followup PA and lateral chest X-ray is recommended in 3-4 weeks following trial of antibiotic therapy to ensure resolution and exclude underlying malignancy. Overall, findings are worse than on the prior study. Electronically Signed   By: Marybelle Killings M.D.   On: 03/08/2017 07:31     PATHOLOGY:    ASSESSMENT AND PLAN:  Iron deficiency anemia Iron deficiency anemia, secondary to chronic GI blood loss with EGD/colonoscopy in 06/2016 demonstrating gastric erosions.  She required IV iron with positive response to therapy.  She is currently on oral iron therapy with Vitamin C.  Absorption possibly hindered secondary to chronic PPI use.  She still requires intermittent IV iron based upon iron calculation deficit.  Given her chronic renal disease, she is a candidate for lower doses of IV iron including ferric gluconate and Feraheme.  Labs today: CBC diff, CMET,  iron/TIBC, ferritin.  I personally reviewed and went over laboratory results with the patient.  The results are noted within this dictation.  Hemoglobin is WNL, so too is her platelet count and WBC.  Renal function panel confirms stable chronic renal disease.  Labs in 3 months: CBC diff, iron/TIBC, ferritin. Labs in 6 months: CBC diff, BMET, iron/TIBC, ferritin.  Return in 6 months for follow-up.  Will provide IV iron when indicated.  Continue PO iron with Vitamin C.   2. CKD (chronic kidney disease), stage III (HCC) Stable.  Given renal disease, based upon iron deficit, can use ferric gluconate or Feraheme.  3. Acute on chronic combined systolic and diastolic CHF (congestive heart failure) ([place]) Contributed to most recent hospitalization.  4. Acute respiratory failure with hypoxemia ([place]) Hospitalized from 10/1- 03/09/2017 for  multifactorial hypoxemia including pulmonary edema, pneumonia, CHF exacerbation  5. Essential hypertension Controlled- BP today is 113/66.  6. Congenital hypothyroidism with diffuse goiter On replacement therapy, levothyroxine  7. Generalized weakness Multifactorial with element of iron deficiency participating when iron levels are low.  Otherwise, she has baseline weakness/fatigue from her other co-morbidities.   Final Result of Complexity      Choose decision making level with 2 or 3 checks OR choose the decision making level on Section B       A Number of diagnoses or treatment options  []   </= 1 Minimal  []   2 Limited  [x]   3 Multiple  []   >/= 4 Extensive  B Amount and complexity of data  []   </= 1 Minimal or low  []   2 Limited  [x]   3  Moderate  []   >/= 4 Extensive  C Highest risk  []   Minimal  []   Low  [x]   Moderate  []   High   Type of decision making  []   Straight-forward  []   Low Complexity  [x]   Moderate- Complexity  []   High- Complexity     ORDERS PLACED FOR THIS ENCOUNTER: Orders Placed This Encounter  Procedures  . CBC with Differential  . Iron and TIBC  . Ferritin  . CBC with Differential  . Basic metabolic panel  . Iron and TIBC  . Ferritin    MEDICATIONS PRESCRIBED THIS ENCOUNTER: No orders of the defined types were placed in this encounter.   THERAPY PLAN:  Continue to monitor for iron deficiency.  Given her renal disease, she is a candidate for ferric gluconate if calculated iron deficit is low.  I would recommend maintaining an iron saturation of 15% - 20% and a ferritin > 100.  All questions were answered. The patient knows to call the clinic with any problems, questions or concerns. We can certainly see the patient much sooner if necessary.  Patient and plan discussed with Dr. Twana First and she is in agreement with the aforementioned.   This note is electronically signed by: Robynn Pane, PA-C 04/06/2017 1:21 PM

## 2017-04-07 LAB — IRON AND TIBC
Iron: 94 ug/dL (ref 28–170)
SATURATION RATIOS: 29 % (ref 10.4–31.8)
TIBC: 322 ug/dL (ref 250–450)
UIBC: 228 ug/dL

## 2017-04-07 LAB — FERRITIN: FERRITIN: 109 ng/mL (ref 11–307)

## 2017-05-11 ENCOUNTER — Ambulatory Visit (HOSPITAL_COMMUNITY)
Admission: RE | Admit: 2017-05-11 | Discharge: 2017-05-11 | Disposition: A | Discharge status: Home / Self Care | Payer: Medicare Other | Source: Ambulatory Visit | Attending: Family Medicine | Admitting: Family Medicine

## 2017-05-11 ENCOUNTER — Other Ambulatory Visit (HOSPITAL_COMMUNITY): Payer: Self-pay | Admitting: Family Medicine

## 2017-05-11 DIAGNOSIS — J189 Pneumonia, unspecified organism: Secondary | ICD-10-CM

## 2017-07-09 ENCOUNTER — Other Ambulatory Visit (HOSPITAL_COMMUNITY)
Admission: RE | Admit: 2017-07-09 | Discharge: 2017-07-09 | Disposition: A | Discharge status: Home / Self Care | Payer: Medicare Other | Source: Ambulatory Visit | Attending: Family Medicine | Admitting: Family Medicine

## 2017-07-09 ENCOUNTER — Inpatient Hospital Stay (HOSPITAL_COMMUNITY): Payer: Medicare Other | Attending: Oncology

## 2017-07-09 DIAGNOSIS — K922 Gastrointestinal hemorrhage, unspecified: Secondary | ICD-10-CM | POA: Insufficient documentation

## 2017-07-09 DIAGNOSIS — D500 Iron deficiency anemia secondary to blood loss (chronic): Secondary | ICD-10-CM | POA: Diagnosis present

## 2017-07-09 DIAGNOSIS — R5383 Other fatigue: Secondary | ICD-10-CM | POA: Insufficient documentation

## 2017-07-09 LAB — CBC WITH DIFFERENTIAL/PLATELET
Basophils Absolute: 0 10*3/uL (ref 0.0–0.1)
Basophils Relative: 0 %
Eosinophils Absolute: 0.1 10*3/uL (ref 0.0–0.7)
Eosinophils Relative: 2 %
HEMATOCRIT: 38.5 % (ref 36.0–46.0)
Hemoglobin: 12.6 g/dL (ref 12.0–15.0)
LYMPHS PCT: 26 %
Lymphs Abs: 1.5 10*3/uL (ref 0.7–4.0)
MCH: 32.7 pg (ref 26.0–34.0)
MCHC: 32.7 g/dL (ref 30.0–36.0)
MCV: 100 fL (ref 78.0–100.0)
MONO ABS: 0.5 10*3/uL (ref 0.1–1.0)
MONOS PCT: 9 %
Neutro Abs: 3.6 10*3/uL (ref 1.7–7.7)
Neutrophils Relative %: 63 %
Platelets: 301 10*3/uL (ref 150–400)
RBC: 3.85 MIL/uL — ABNORMAL LOW (ref 3.87–5.11)
RDW: 12.9 % (ref 11.5–15.5)
WBC: 5.8 10*3/uL (ref 4.0–10.5)

## 2017-07-09 LAB — IRON AND TIBC
Iron: 140 ug/dL (ref 28–170)
Saturation Ratios: 47 % — ABNORMAL HIGH (ref 10.4–31.8)
TIBC: 297 ug/dL (ref 250–450)
UIBC: 157 ug/dL

## 2017-07-09 LAB — BASIC METABOLIC PANEL
ANION GAP: 8 (ref 5–15)
BUN: 30 mg/dL — AB (ref 6–20)
CO2: 27 mmol/L (ref 22–32)
Calcium: 8.9 mg/dL (ref 8.9–10.3)
Chloride: 105 mmol/L (ref 101–111)
Creatinine, Ser: 1.12 mg/dL — ABNORMAL HIGH (ref 0.44–1.00)
GFR, EST AFRICAN AMERICAN: 48 mL/min — AB (ref >60)
GFR, EST NON AFRICAN AMERICAN: 41 mL/min — AB (ref >60)
Glucose, Bld: 96 mg/dL (ref 65–99)
POTASSIUM: 3.8 mmol/L (ref 3.5–5.1)
SODIUM: 140 mmol/L (ref 135–145)

## 2017-07-09 LAB — FERRITIN: Ferritin: 121 ng/mL (ref 11–307)

## 2017-07-09 LAB — TSH: TSH: 6.03 u[IU]/mL — AB (ref 0.350–4.500)

## 2017-07-09 LAB — T4, FREE: Free T4: 1.1 ng/dL (ref 0.61–1.12)

## 2017-07-09 LAB — MAGNESIUM: MAGNESIUM: 2.3 mg/dL (ref 1.7–2.4)

## 2017-08-07 NOTE — Progress Notes (Signed)
Cardiology Office Note    Date:  08/08/2017   ID:  Katelyn Daniel, DOB August 19, 1923, MRN [id number]  PCP:  Lucia Gaskins, MD  Cardiologist: [name], MD    Chief Complaint  Patient presents with  . Follow-up    6 month visit    History of Present Illness:    Katelyn Daniel is a 84 y.o. female with past medical history of chronic combined systolic and diastolic CHF (EF 73-22% by echo in 08/2016), HTN, HLD, Hypothyroidism, COPD, and Stage 3 CKD who presents to the office today for 6 month follow-up.   She was last examined by Jory Sims, DNP in 02/2017 and reported occasional palpitations at that time. Denied any recent chest pain and breathing was at baseline. Weight was stable at 141 lbs and she was continued on her current medication regimen.   In the interim, she was admitted to [facility] in 03/2017 for acute respiratory failure in the setting of CHF and HCAP. She was appropriately treated with antibiotic therapy along with IV Lasix. She was discharged on PO Lasix 40 mg twice daily.  In talking the patient today, she reports overall doing well from a cardiac perspective since her last office visit. Denies any recent chest pain, dyspnea on exertion, orthopnea, PND, lower extremity edema, or palpitations. She walks around the grocery store and [facility] regularly without any anginal symptoms. She does not weigh daily but says weight has been stable at 140-140 lbs on her home scales. She reports good compliance with Lasix 40 mg twice daily.    Past Medical History:  Diagnosis Date  . Candida esophagitis ([place])   . Chronic combined systolic (congestive) and diastolic (congestive) heart failure ([place])    a. 08/2016: echo showing EF of 45-50%, diffuse HK, Grade 1 DD, and mild MR  . CKD (chronic kidney disease), stage III ([place]) 03/09/2017  . Hyperlipemia   . Hypertension   . Iron deficiency anemia   . Thyroid disease     Past Surgical History:  Procedure Laterality Date  .  COLONOSCOPY N/A 06/14/2016   Procedure: COLONOSCOPY;  Surgeon: [name], MD;  [facility];  Service: Endoscopy;  Laterality: N/A;  . ESOPHAGOGASTRODUODENOSCOPY N/A 06/14/2016   Procedure: ESOPHAGOGASTRODUODENOSCOPY (EGD);  Surgeon: [name], MD;  Location: [facility];  Service: Endoscopy;  Laterality: N/A;  WITH DILATION   . TONSILLECTOMY    . [facility] SINUS      Current Medications: Outpatient Medications Prior to Visit  Medication Sig Dispense Refill  . acetaminophen (TYLENOL) 500 MG tablet Take 500 mg by mouth every 6 (six) hours as needed for mild pain.    . bisoprolol (ZEBETA) 5 MG tablet Take 1 tablet (5 mg total) by mouth daily. 30 tablet 2  . Calcium Citrate-Vitamin D (CALCIUM CITRATE + D PO) Take 1 tablet by mouth daily.    . cholecalciferol (VITAMIN D) 1000 units tablet Take 1,000 Units by mouth daily.    Marland Kitchen dextromethorphan-guaiFENesin (MUCINEX DM) 30-600 MG 12hr tablet Take 1 tablet by mouth 2 (two) times daily as needed for cough.    . gabapentin (NEURONTIN) 300 MG capsule Take 300 mg by mouth at bedtime.    Marland Kitchen ipratropium-albuterol (DUONEB) 0.5-2.5 (3) MG/3ML SOLN Take 3 mLs by nebulization every 6 (six) hours. 360 mL   . levothyroxine (SYNTHROID, LEVOTHROID) 50 MCG tablet Take 50 mcg by mouth daily.    . Multiple Vitamins-Minerals (PRESERVISION AREDS PO) Take 1 capsule by mouth  2 (two) times daily.     . Omega-3 Fatty Acids (FISH OIL) 1000 MG CAPS Take 1,000 mg by mouth daily.    . pantoprazole (PROTONIX) 40 MG tablet Take 1 tablet (40 mg total) by mouth 2 (two) times daily before a meal. (Patient taking differently: Take 40 mg by mouth daily. ) 60 tablet 3  . Polysacchar Iron-FA-B12 (FERREX 150 FORTE) 150-1-25 MG-MG-MCG CAPS Take 1 capsule by mouth daily. 30 capsule 11  . potassium chloride SA (K-DUR,KLOR-CON) 20 MEQ tablet Take 1 tablet (20 mEq total) by mouth 2 (two) times daily.    . pravastatin (PRAVACHOL) 40 MG tablet Take 40 mg by mouth daily.  3  .  traZODone (DESYREL) 50 MG tablet Take 1 tablet (50 mg total) by mouth at bedtime as needed for sleep. 30 tablet 3  . furosemide (LASIX) 10 MG/ML injection Inject 4 mLs (40 mg total) into the vein 2 (two) times daily. 4 mL 0  . furosemide (LASIX) 40 MG tablet Take 40 mg by mouth.    . DiazePAM (VALIUM PO) Take 1 tablet by mouth at bedtime as needed (for sleep).     No facility-administered medications prior to visit.      Allergies:   Patient has no known allergies.   Social History   Socioeconomic History  . Marital status: Married    Spouse name: None  . Number of children: None  . Years of education: None  . Highest education level: None  Social Needs  . Financial resource strain: None  . Food insecurity - worry: None  . Food insecurity - inability: None  . Transportation needs - medical: None  . Transportation needs - non-medical: None  Occupational History  . None  Tobacco Use  . Smoking status: Never Smoker  . Smokeless tobacco: Never Used  Substance and Sexual Activity  . Alcohol use: No  . Drug use: No  . Sexual activity: Not Currently  Other Topics Concern  . None  Social History Narrative  . None     Family History:  The patient's family history includes Asthma in her mother; Congestive Heart Failure in her mother; Emphysema in her father; Heart Problems in her brother.   Review of Systems:   Please see the history of present illness.     General:  No chills, fever, night sweats or weight changes.  Cardiovascular:  No chest pain, dyspnea on exertion, edema, orthopnea, palpitations, paroxysmal nocturnal dyspnea. Dermatological: No rash, lesions/masses Respiratory: No cough, dyspnea Urologic: No hematuria, dysuria Abdominal:   No nausea, vomiting, diarrhea, bright red blood per rectum, melena, or hematemesis Neurologic:  No visual changes, changes in mental status. Positive for weakness.   All other systems reviewed and are otherwise negative except as noted  above.   Physical Exam:    VS:  BP 118/68   Pulse (!) 57   Ht 5' 7.5" (1.715 m)   Wt 142 lb 12.8 oz (64.8 kg)   SpO2 98%   BMI 22.04 kg/m    General: Well developed, elderly Caucasian female appearing in no acute distress. Head: Normocephalic, atraumatic, sclera non-icteric, no xanthomas, nares are without discharge.  Neck: No carotid bruits. JVD not elevated.  Lungs: Respirations regular and unlabored, without wheezes or rales.  Heart: Regular rate and rhythm. No S3 or S4.  No murmur, no rubs, or gallops appreciated. Abdomen: Soft, non-tender, non-distended with normoactive bowel sounds. No hepatomegaly. No rebound/guarding. No obvious abdominal masses. Msk:  Strength and tone appear  normal for age. No joint deformities or effusions. Extremities: No clubbing or cyanosis. Trace lower extremity edema.  Distal pedal pulses are 2+ bilaterally. Neuro: Alert and oriented X 3. Moves all extremities spontaneously. No focal deficits noted. Psych:  Responds to questions appropriately with a normal affect. Skin: No rashes or lesions noted  Wt Readings from Last 3 Encounters:  08/08/17 142 lb 12.8 oz (64.8 kg)  04/06/17 141 lb (64 kg)  03/09/17 141 lb 4.8 oz (64.1 kg)      Studies/Labs Reviewed:   EKG:  EKG is not ordered today.   Recent Labs: 03/05/2017: B Natriuretic Peptide 1,510.2 04/06/2017: ALT 26 07/09/2017: BUN 30; Creatinine, Ser 1.12; Hemoglobin 12.6; Magnesium 2.3; Platelets 301; Potassium 3.8; Sodium 140; TSH 6.030   Lipid Panel No results found for: CHOL, TRIG, HDL, CHOLHDL, VLDL, LDLCALC, LDLDIRECT  Additional studies/ records that were reviewed today include:   Echocardiogram: 08/2016 Study Conclusions  - Left ventricle: The cavity size was normal. Wall thickness was   increased in a pattern of mild LVH. Systolic function was mildly   reduced. The estimated ejection fraction was in the range of 45%   to 50%. Diffuse hypokinesis. There is moderate hypokinesis of  the   basal-midinferolateral myocardium. Doppler parameters are   consistent with abnormal left ventricular relaxation (grade 1   diastolic dysfunction). - Aortic valve: Mildly calcified annulus. Trileaflet; mildly   calcified leaflets. There was mild regurgitation. Mean gradient   (S): 5 mm Hg. - Mitral valve: Calcified annulus. There was mild regurgitation. - Left atrium: The atrium was mildly dilated. - Right atrium: The atrium was at the upper limits of normal in   size. - Atrial septum: No defect or patent foramen ovale was identified. - Tricuspid valve: There was mild regurgitation. - Pulmonary arteries: PA peak pressure: 34 mm Hg (S). - Pericardium, extracardiac: A trivial pericardial effusion was   identified posterior to the heart.  Impressions:  - Mild LVH with LVEF 45-50% and diffuse hypokinesis, more prominent   in the mid to basal inferolateral wall. Grade 1 diastolic   dysfunction. Mild left atrial enlargement. Mildly calcified   mitral annulus with mild mitral regurgitation. Sclerotic aortic   valve with mild aortic regurgitation. Mild tricuspid   regurgitation with PASP estimated 34 mmHg. Trivial posterior   pericardial effusion noted.  Assessment:    1. Chronic combined systolic (congestive) and diastolic (congestive) heart failure ([place])   2. Essential hypertension   3. Hyperlipidemia, unspecified hyperlipidemia type   4. Hypothyroidism, unspecified type   5. Stage 3 chronic kidney disease ([place])      Plan:   In order of problems listed above:  1. Chronic Combined Systolic and Diastolic CHF - the patient has a known reduced EF of 45-50% by echo in 08/2016. She denies any recent chest pain, dyspnea on exertion, orthopnea, PND, or lower extremity edema. Reports weight has been stable on her home scales.  - does not appear volume overloaded by physical examination.  - continue Lasix at current dosing (patient initially said she was taking this once daily but  later confirmed she is taking it BID). Recent labs showed stable potassium levels and kidney function, therefore continue at 40mg  BID.   2. HTN  - BP is well-controlled at 118/68 during today's visit. - continue Bisoprolol 5mg  daily.   3. HLD - no recent FLP available for review in EPIC. Followed by PCP.  - Remains on Pravastatin 40mg  daily.   4. Hypothyroidism - recent  thyroid studies showed TSH at 6.030 with Free T4 1.10. - remains on Synthroid 50 mcg daily. Followed by PCP.   5. Stage 3 CKD - baseline creatinine 1.1 - 1.2. Stable at 1.12 on 07/09/2017.   Medication Adjustments/Labs and Tests Ordered: Current medicines are reviewed at length with the patient today.  Concerns regarding medicines are outlined above.  Medication changes, Labs and Tests ordered today are listed in the Patient Instructions below. Patient Instructions  Medication Instructions:  Your physician recommends that you continue on your current medications as directed. Please refer to the Current Medication list given to you today.   Labwork: NONE   Testing/Procedures: NONE   Follow-Up: Your physician wants you to follow-up in: 6 Months. You will receive a reminder letter in the mail two months in advance. If you don't receive a letter, please call our office to schedule the follow-up appointment.  Any Other Special Instructions Will Be Listed Below (If Applicable).  If you need a refill on your cardiac medications before your next appointment, please call your pharmacy.  Thank you for choosing [facility]!    Signed, Erma Heritage, PA-C  08/08/2017 3:25 PM    [facility] [address] [place], [place] [address] Phone: [phone]

## 2017-08-08 ENCOUNTER — Ambulatory Visit (INDEPENDENT_AMBULATORY_CARE_PROVIDER_SITE_OTHER): Payer: Medicare Other | Admitting: Student

## 2017-08-08 ENCOUNTER — Encounter: Payer: Self-pay | Admitting: Student

## 2017-08-08 VITALS — BP 118/68 | HR 57 | Ht 67.5 in | Wt 142.8 lb

## 2017-08-08 DIAGNOSIS — N183 Chronic kidney disease, stage 3 unspecified: Secondary | ICD-10-CM

## 2017-08-08 DIAGNOSIS — I10 Essential (primary) hypertension: Secondary | ICD-10-CM | POA: Diagnosis not present

## 2017-08-08 DIAGNOSIS — I5042 Chronic combined systolic (congestive) and diastolic (congestive) heart failure: Secondary | ICD-10-CM

## 2017-08-08 DIAGNOSIS — E039 Hypothyroidism, unspecified: Secondary | ICD-10-CM

## 2017-08-08 DIAGNOSIS — E785 Hyperlipidemia, unspecified: Secondary | ICD-10-CM | POA: Diagnosis not present

## 2017-08-08 MED ORDER — FUROSEMIDE 40 MG PO TABS: 40.0000 mg | ORAL_TABLET | Freq: Two times a day (BID) | ORAL | Status: DC

## 2017-08-08 NOTE — Patient Instructions (Signed)
Medication Instructions:  Your physician recommends that you continue on your current medications as directed. Please refer to the Current Medication list given to you today.   Labwork: NONE   Testing/Procedures: NONE   Follow-Up: Your physician wants you to follow-up in: 6 Months. You will receive a reminder letter in the mail two months in advance. If you don't receive a letter, please call our office to schedule the follow-up appointment.   Any Other Special Instructions Will Be Listed Below (If Applicable).     If you need a refill on your cardiac medications before your next appointment, please call your pharmacy.  Thank you for choosing [facility]!

## 2017-09-27 ENCOUNTER — Inpatient Hospital Stay (HOSPITAL_COMMUNITY): Payer: Medicare Other | Attending: Hematology

## 2017-09-27 DIAGNOSIS — K922 Gastrointestinal hemorrhage, unspecified: Secondary | ICD-10-CM | POA: Insufficient documentation

## 2017-09-27 DIAGNOSIS — N183 Chronic kidney disease, stage 3 unspecified: Secondary | ICD-10-CM

## 2017-09-27 DIAGNOSIS — D500 Iron deficiency anemia secondary to blood loss (chronic): Secondary | ICD-10-CM | POA: Diagnosis not present

## 2017-09-27 LAB — BASIC METABOLIC PANEL
ANION GAP: 11 (ref 5–15)
BUN: 32 mg/dL — ABNORMAL HIGH (ref 6–20)
CHLORIDE: 102 mmol/L (ref 101–111)
CO2: 27 mmol/L (ref 22–32)
CREATININE: 1.46 mg/dL — AB (ref 0.44–1.00)
Calcium: 9.1 mg/dL (ref 8.9–10.3)
GFR calc Af Amer: 35 mL/min — ABNORMAL LOW (ref >60)
GFR calc non Af Amer: 30 mL/min — ABNORMAL LOW (ref >60)
Glucose, Bld: 80 mg/dL (ref 65–99)
Potassium: 4.2 mmol/L (ref 3.5–5.1)
Sodium: 140 mmol/L (ref 135–145)

## 2017-09-27 LAB — CBC WITH DIFFERENTIAL/PLATELET
BASOS PCT: 1 %
Basophils Absolute: 0 10*3/uL (ref 0.0–0.1)
Eosinophils Absolute: 0.3 10*3/uL (ref 0.0–0.7)
Eosinophils Relative: 5 %
HEMATOCRIT: 40.7 % (ref 36.0–46.0)
HEMOGLOBIN: 13.8 g/dL (ref 12.0–15.0)
LYMPHS ABS: 1.9 10*3/uL (ref 0.7–4.0)
LYMPHS PCT: 29 %
MCH: 34 pg (ref 26.0–34.0)
MCHC: 33.9 g/dL (ref 30.0–36.0)
MCV: 100.2 fL — ABNORMAL HIGH (ref 78.0–100.0)
MONOS PCT: 12 %
Monocytes Absolute: 0.8 10*3/uL (ref 0.1–1.0)
NEUTROS ABS: 3.5 10*3/uL (ref 1.7–7.7)
NEUTROS PCT: 53 %
Platelets: 330 10*3/uL (ref 150–400)
RBC: 4.06 MIL/uL (ref 3.87–5.11)
RDW: 12.8 % (ref 11.5–15.5)
WBC: 6.5 10*3/uL (ref 4.0–10.5)

## 2017-09-27 LAB — IRON AND TIBC
Iron: 151 ug/dL (ref 28–170)
SATURATION RATIOS: 48 % — AB (ref 10.4–31.8)
TIBC: 314 ug/dL (ref 250–450)
UIBC: 163 ug/dL

## 2017-09-27 LAB — FERRITIN: FERRITIN: 127 ng/mL (ref 11–307)

## 2017-10-04 ENCOUNTER — Ambulatory Visit (HOSPITAL_COMMUNITY): Payer: Medicare Other | Admitting: Internal Medicine

## 2017-11-08 ENCOUNTER — Ambulatory Visit (HOSPITAL_COMMUNITY)
Admission: RE | Admit: 2017-11-08 | Discharge: 2017-11-08 | Disposition: A | Discharge status: Home / Self Care | Payer: Medicare Other | Source: Ambulatory Visit | Attending: Family Medicine | Admitting: Family Medicine

## 2017-11-08 ENCOUNTER — Other Ambulatory Visit (HOSPITAL_COMMUNITY): Payer: Self-pay | Admitting: Family Medicine

## 2017-11-08 DIAGNOSIS — I700 Atherosclerosis of aorta: Secondary | ICD-10-CM | POA: Insufficient documentation

## 2017-11-08 DIAGNOSIS — M4856XA Collapsed vertebra, not elsewhere classified, lumbar region, initial encounter for fracture: Secondary | ICD-10-CM | POA: Insufficient documentation

## 2017-11-08 DIAGNOSIS — M25551 Pain in right hip: Secondary | ICD-10-CM

## 2017-11-08 DIAGNOSIS — M5136 Other intervertebral disc degeneration, lumbar region: Secondary | ICD-10-CM | POA: Insufficient documentation

## 2017-11-08 DIAGNOSIS — M47816 Spondylosis without myelopathy or radiculopathy, lumbar region: Secondary | ICD-10-CM | POA: Diagnosis not present

## 2017-11-08 DIAGNOSIS — W19XXXD Unspecified fall, subsequent encounter: Secondary | ICD-10-CM

## 2017-11-13 ENCOUNTER — Encounter: Payer: Self-pay | Admitting: Orthopaedic Surgery

## 2017-11-13 ENCOUNTER — Ambulatory Visit (INDEPENDENT_AMBULATORY_CARE_PROVIDER_SITE_OTHER): Payer: Medicare Other | Admitting: Orthopaedic Surgery

## 2017-11-13 VITALS — BP 144/78 | HR 56 | Ht 66.0 in | Wt 139.0 lb

## 2017-11-13 DIAGNOSIS — S32010A Wedge compression fracture of first lumbar vertebra, initial encounter for closed fracture: Secondary | ICD-10-CM

## 2017-11-13 MED ORDER — HYDROCODONE-ACETAMINOPHEN 5-325 MG PO TABS: ORAL_TABLET | ORAL | 0 refills | Status: DC

## 2017-11-13 NOTE — Progress Notes (Signed)
Subjective:    Patient ID: Katelyn Daniel, female    DOB: 05/14/1924, 84 y.o.   MRN: [id number]  HPI She fell on June 3rd and hurt her right hip area and lower back.  She was seen by Dr. [name].  X-rays were done and the hip was negative, the lumbar spine showed a 10 to 20% compression fracture of L1.  She was given oxycodone 5 and told to see me.  She has no other injury.  She uses a cane.  She has no prior fracture.   Review of Systems  Constitutional: Positive for activity change.  Respiratory: Negative for cough and shortness of breath.   Cardiovascular: Negative for chest pain and leg swelling.  Endocrine: Positive for cold intolerance.  Musculoskeletal: Positive for arthralgias, back pain and gait problem.  Allergic/Immunologic: Positive for environmental allergies.  All other systems reviewed and are negative.  Past Medical History:  Diagnosis Date  . Candida esophagitis ([place])   . Chronic combined systolic (congestive) and diastolic (congestive) heart failure ([place])    a. 08/2016: echo showing EF of 45-50%, diffuse HK, Grade 1 DD, and mild MR  . CKD (chronic kidney disease), stage III ([place]) 03/09/2017  . Heart disease   . Hyperlipemia   . Hypertension   . Iron deficiency anemia   . Pneumonia   . Thyroid activity decreased   . Thyroid disease     Past Surgical History:  Procedure Laterality Date  . COLONOSCOPY N/A 06/14/2016   Procedure: COLONOSCOPY;  Surgeon: [name], MD;  [facility];  Service: Endoscopy;  Laterality: N/A;  . ESOPHAGOGASTRODUODENOSCOPY N/A 06/14/2016   Procedure: ESOPHAGOGASTRODUODENOSCOPY (EGD);  Surgeon: [name], MD;  [facility];  Service: Endoscopy;  Laterality: N/A;  WITH DILATION   . TONSILLECTOMY    . [facility] SINUS      Current Outpatient Medications on File Prior to Visit  Medication Sig Dispense Refill  . acetaminophen (TYLENOL) 500 MG tablet Take 500 mg by mouth every 6 (six) hours as needed for mild  pain.    . bisoprolol (ZEBETA) 5 MG tablet Take 1 tablet (5 mg total) by mouth daily. 30 tablet 2  . Calcium Citrate-Vitamin D (CALCIUM CITRATE + D PO) Take 1 tablet by mouth daily.    . cholecalciferol (VITAMIN D) 1000 units tablet Take 1,000 Units by mouth daily.    Marland Kitchen dextromethorphan-guaiFENesin (MUCINEX DM) 30-600 MG 12hr tablet Take 1 tablet by mouth 2 (two) times daily as needed for cough.    . furosemide (LASIX) 40 MG tablet Take 1 tablet (40 mg total) by mouth 2 (two) times daily. 30 tablet   . gabapentin (NEURONTIN) 300 MG capsule Take 300 mg by mouth at bedtime.    Marland Kitchen ipratropium-albuterol (DUONEB) 0.5-2.5 (3) MG/3ML SOLN Take 3 mLs by nebulization every 6 (six) hours. 360 mL   . levothyroxine (SYNTHROID, LEVOTHROID) 50 MCG tablet Take 50 mcg by mouth daily.    . Multiple Vitamins-Minerals (PRESERVISION AREDS PO) Take 1 capsule by mouth 2 (two) times daily.     . Omega-3 Fatty Acids (FISH OIL) 1000 MG CAPS Take 1,000 mg by mouth daily.    . pantoprazole (PROTONIX) 40 MG tablet Take 1 tablet (40 mg total) by mouth 2 (two) times daily before a meal. (Patient taking differently: Take 40 mg by mouth daily. ) 60 tablet 3  . Polysacchar Iron-FA-B12 (FERREX 150 FORTE) 150-1-25 MG-MG-MCG CAPS Take 1 capsule by mouth daily. 30 capsule  11  . potassium chloride SA (K-DUR,KLOR-CON) 20 MEQ tablet Take 1 tablet (20 mEq total) by mouth 2 (two) times daily.    . pravastatin (PRAVACHOL) 40 MG tablet Take 40 mg by mouth daily.  3  . traZODone (DESYREL) 50 MG tablet Take 1 tablet (50 mg total) by mouth at bedtime as needed for sleep. 30 tablet 3   No current facility-administered medications on file prior to visit.     Social History   Socioeconomic History  . Marital status: Married    Spouse name: Not on file  . Number of children: Not on file  . Years of education: Not on file  . Highest education level: Not on file  Occupational History  . Not on file  Social Needs  . Financial resource  strain: Not on file  . Food insecurity:    Worry: Not on file    Inability: Not on file  . Transportation needs:    Medical: Not on file    Non-medical: Not on file  Tobacco Use  . Smoking status: Never Smoker  . Smokeless tobacco: Never Used  Substance and Sexual Activity  . Alcohol use: No  . Drug use: No  . Sexual activity: Not Currently  Lifestyle  . Physical activity:    Days per week: Not on file    Minutes per session: Not on file  . Stress: Not on file  Relationships  . Social connections:    Talks on phone: Not on file    Gets together: Not on file    Attends religious service: Not on file    Active member of club or organization: Not on file    Attends meetings of clubs or organizations: Not on file    Relationship status: Not on file  . Intimate partner violence:    Fear of current or ex partner: Not on file    Emotionally abused: Not on file    Physically abused: Not on file    Forced sexual activity: Not on file  Other Topics Concern  . Not on file  Social History Narrative  . Not on file    Family History  Problem Relation Age of Onset  . Asthma Mother   . Congestive Heart Failure Mother   . Emphysema Father   . Heart Problems Brother   . Colon cancer Neg Hx     BP (!) 144/78   Pulse (!) 56   Ht 5\' 6"  (1.676 m)   Wt 139 lb (63 kg)   BMI 22.44 kg/m       Objective:   Physical Exam  Constitutional: She is oriented to person, place, and time. She appears well-developed and well-nourished.  HENT:  Head: Normocephalic and atraumatic.  Eyes: Pupils are equal, round, and reactive to light. Conjunctivae and EOM are normal.  Neck: Normal range of motion. Neck supple.  Cardiovascular: Normal rate, regular rhythm and intact distal pulses.  Pulmonary/Chest: Effort normal.  Abdominal: Soft.  Musculoskeletal:       Back:       Legs: Neurological: She is alert and oriented to person, place, and time. She has normal reflexes. She displays normal  reflexes. No cranial nerve deficit. She exhibits normal muscle tone. Coordination normal.  Skin: Skin is warm and dry.  Psychiatric: She has a normal mood and affect. Her behavior is normal. Judgment and thought content normal.     I have reviewed the x-rays, x-ray reports and Dr. Denita Lung notes.  Assessment & Plan:   Encounter Diagnosis  Name Primary?  . Closed compression fracture of first lumbar vertebra, initial encounter ([place]) Yes   I have explained the findings to her.  I have given a CASH brace and explained how to use.  If her right hip continues to hurt, then a MRI will be needed.  She has improved some so I will not get now.  Her motion is good of the right hip and not painful.  Return in one week.  Pain medicine given.  Take one Advil and one Tylenol for pain.  I have reviewed the [place] web site prior to prescribing narcotic medicine for this patient.  Call if any problem.  Precautions discussed.   Electronically Signed Sanjuana Kava, MD 6/11/20198:37 AM

## 2017-11-20 ENCOUNTER — Ambulatory Visit (INDEPENDENT_AMBULATORY_CARE_PROVIDER_SITE_OTHER): Payer: Medicare Other

## 2017-11-20 ENCOUNTER — Encounter: Payer: Self-pay | Admitting: Orthopaedic Surgery

## 2017-11-20 ENCOUNTER — Ambulatory Visit (INDEPENDENT_AMBULATORY_CARE_PROVIDER_SITE_OTHER): Payer: Medicare Other | Admitting: Orthopaedic Surgery

## 2017-11-20 VITALS — BP 144/70 | HR 65 | Ht 66.0 in | Wt 143.0 lb

## 2017-11-20 DIAGNOSIS — S32010D Wedge compression fracture of first lumbar vertebra, subsequent encounter for fracture with routine healing: Secondary | ICD-10-CM

## 2017-11-20 NOTE — Progress Notes (Signed)
CC:  I feel better  She has compression fracture of L1.  She has less pain.  She is using her CASH brace.  She has resolution of the right hip pain she had.  NV intact. She is using the brace appropriately.  Encounter Diagnosis  Name Primary?  . Closed compression fracture of L1 lumbar vertebra with routine healing, subsequent encounter Yes   X-rays were done of her lumbar spine, reported separately.  Return in one month.  X-rays on return of the lateral lumbar spine.  Call if any problem.  Precautions discussed.   Electronically Signed Sanjuana Kava, MD 6/18/20199:18 AM

## 2017-12-20 ENCOUNTER — Encounter: Payer: Self-pay | Admitting: Orthopaedic Surgery

## 2017-12-20 ENCOUNTER — Ambulatory Visit (INDEPENDENT_AMBULATORY_CARE_PROVIDER_SITE_OTHER): Payer: Medicare Other

## 2017-12-20 ENCOUNTER — Ambulatory Visit (INDEPENDENT_AMBULATORY_CARE_PROVIDER_SITE_OTHER): Payer: Medicare Other | Admitting: Orthopaedic Surgery

## 2017-12-20 DIAGNOSIS — S32010D Wedge compression fracture of first lumbar vertebra, subsequent encounter for fracture with routine healing: Secondary | ICD-10-CM

## 2017-12-20 NOTE — Progress Notes (Signed)
CC: my back is better. I don't like that brace  She is wearing the CASH brace.  She has no new pains or problems.  NV intact.  Brace is OK.  X-rays were done of the lumbar spine, reported separately.  Encounter Diagnosis  Name Primary?  . Closed compression fracture of L1 lumbar vertebra with routine healing, subsequent encounter Yes   Continue the brace.  She may gradually wean out in three weeks but wear in public.  X-rays on return.  Call if any problem.  Precautions discussed.   Electronically Signed Sanjuana Kava, MD 7/18/20199:42 AM

## 2018-01-17 ENCOUNTER — Ambulatory Visit (INDEPENDENT_AMBULATORY_CARE_PROVIDER_SITE_OTHER): Payer: Medicare Other | Admitting: Orthopaedic Surgery

## 2018-01-17 ENCOUNTER — Encounter: Payer: Self-pay | Admitting: Orthopaedic Surgery

## 2018-01-17 ENCOUNTER — Encounter (INDEPENDENT_AMBULATORY_CARE_PROVIDER_SITE_OTHER): Payer: Medicare Other | Admitting: Ophthalmology

## 2018-01-17 ENCOUNTER — Ambulatory Visit (INDEPENDENT_AMBULATORY_CARE_PROVIDER_SITE_OTHER): Payer: Medicare Other

## 2018-01-17 VITALS — BP 121/58 | HR 66 | Ht 66.0 in | Wt 142.0 lb

## 2018-01-17 DIAGNOSIS — S32010D Wedge compression fracture of first lumbar vertebra, subsequent encounter for fracture with routine healing: Secondary | ICD-10-CM

## 2018-01-17 DIAGNOSIS — H353231 Exudative age-related macular degeneration, bilateral, with active choroidal neovascularization: Secondary | ICD-10-CM | POA: Diagnosis not present

## 2018-01-17 DIAGNOSIS — H43813 Vitreous degeneration, bilateral: Secondary | ICD-10-CM

## 2018-01-17 NOTE — Progress Notes (Signed)
CC:  I am much better  She has little pain of the back now.  She has weaned out of the CASH brace.  NV intact. She is walking well with a cane.  X-rays were done today, reported separately.  Encounter Diagnosis  Name Primary?  . Closed compression fracture of L1 lumbar vertebra with routine healing, subsequent encounter Yes   I will see her in six weeks.  X-rays then.  Call if any problem.  Precautions discussed.   Electronically Signed Sanjuana Kava, MD 8/15/20199:38 AM

## 2018-02-05 ENCOUNTER — Encounter: Payer: Self-pay | Admitting: Cardiovascular Disease

## 2018-02-14 ENCOUNTER — Encounter (INDEPENDENT_AMBULATORY_CARE_PROVIDER_SITE_OTHER): Payer: Medicare Other | Admitting: Ophthalmology

## 2018-02-14 DIAGNOSIS — H353231 Exudative age-related macular degeneration, bilateral, with active choroidal neovascularization: Secondary | ICD-10-CM

## 2018-02-14 DIAGNOSIS — H43813 Vitreous degeneration, bilateral: Secondary | ICD-10-CM

## 2018-02-28 ENCOUNTER — Encounter: Payer: Self-pay | Admitting: Orthopaedic Surgery

## 2018-02-28 ENCOUNTER — Ambulatory Visit (INDEPENDENT_AMBULATORY_CARE_PROVIDER_SITE_OTHER): Payer: Medicare Other

## 2018-02-28 ENCOUNTER — Ambulatory Visit (INDEPENDENT_AMBULATORY_CARE_PROVIDER_SITE_OTHER): Payer: Medicare Other | Admitting: Orthopaedic Surgery

## 2018-02-28 VITALS — BP 138/87 | HR 59 | Ht 66.0 in

## 2018-02-28 DIAGNOSIS — S32010D Wedge compression fracture of first lumbar vertebra, subsequent encounter for fracture with routine healing: Secondary | ICD-10-CM | POA: Diagnosis not present

## 2018-02-28 NOTE — Progress Notes (Signed)
CC:  My back does not hurt  She is doing well with her back.  She has no pain.  X-rays were done, reported separately.  Encounter Diagnosis  Name Primary?  . Closed compression fracture of L1 lumbar vertebra with routine healing, subsequent encounter Yes   I will see her as needed.  Call if any problem.  Precautions discussed.   Electronically Signed Sanjuana Kava, MD 9/26/20199:17 AM

## 2018-03-13 ENCOUNTER — Encounter (INDEPENDENT_AMBULATORY_CARE_PROVIDER_SITE_OTHER): Payer: Medicare Other | Admitting: Ophthalmology

## 2018-03-13 DIAGNOSIS — H43813 Vitreous degeneration, bilateral: Secondary | ICD-10-CM | POA: Diagnosis not present

## 2018-03-13 DIAGNOSIS — H353231 Exudative age-related macular degeneration, bilateral, with active choroidal neovascularization: Secondary | ICD-10-CM

## 2018-03-14 IMAGING — DX DG CHEST 1V PORT
1 series · 1 of 1 positions shown · non-contrast
Comparison: 03/06/2017

CLINICAL DATA: Chest tightness and short of breath

EXAM:
PORTABLE CHEST 1 VIEW

[chest ap]
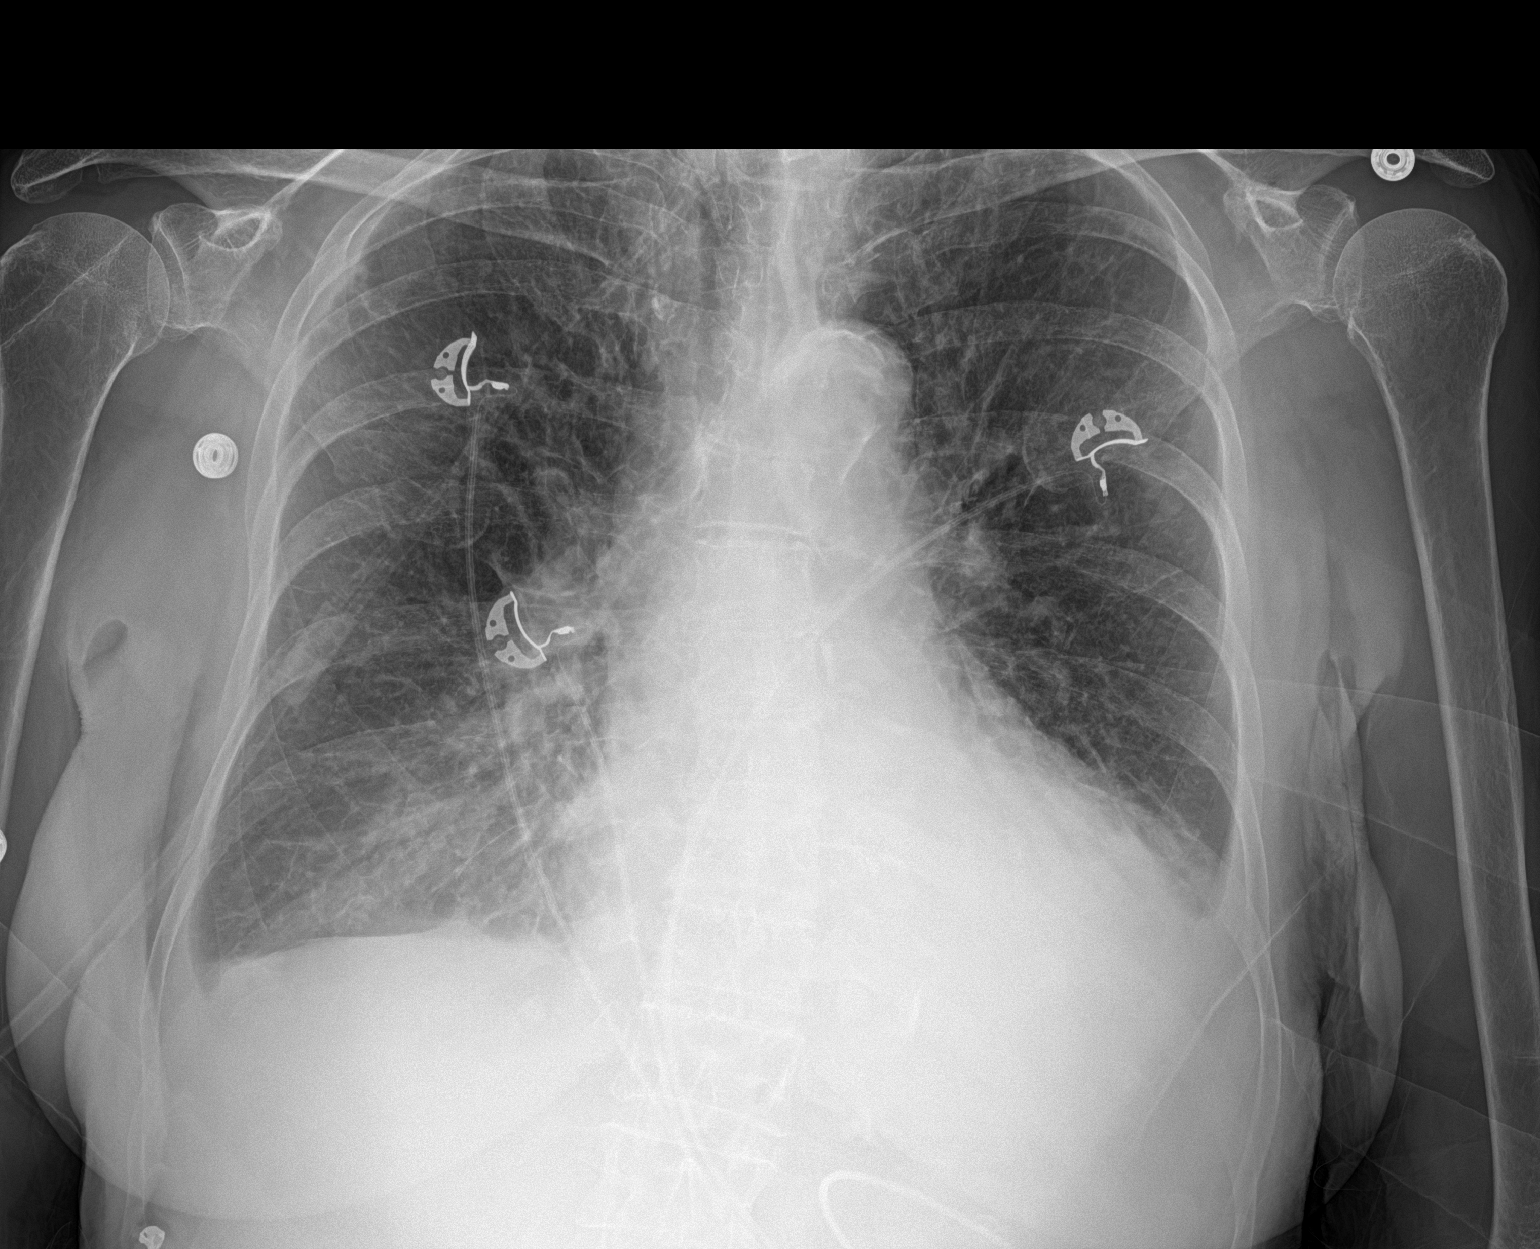

[1 of 1 positions shown; findings below may reference images not displayed]

FINDINGS: The heart is moderately enlarged. Vascular congestion. [place]
B-lines are seen bilaterally. There is central basilar airspace
disease. Left pleural effusion.
IMPRESSION: CHF with pulmonary edema and a left pleural effusion. Bibasilar
airspace disease is present and an inflammatory process cannot be
excluded. Followup PA and lateral chest X-ray is recommended in 3-4
weeks following trial of antibiotic therapy to ensure resolution and
exclude underlying malignancy. Overall, findings are worse than on
the prior study.

## 2018-03-20 NOTE — Progress Notes (Signed)
Cardiology Office Note    Date:  03/21/2018   ID:  Katelyn Daniel, DOB Oct 23, 1923, MRN [id number]  PCP:  Lucia Gaskins, MD  Cardiologist: [name], MD    Chief Complaint  Patient presents with  . Follow-up    6 month visit    History of Present Illness:    Katelyn Daniel is a 84 y.o. female with past medical history of chronic combined systolic and diastolic CHF (EF 39-76% by echo in 08/2016), HTN, HLD, Hypothyroidism, COPD, and Stage 3 CKD who presents to the office today for 51-month follow-up.  She was last examined by myself in 08/2017 and denied any recent chest pain or dyspnea on exertion at that time. Was weighing herself intermittently in the ambulatory setting and reported weight was stable at approximately 140 lbs on her home scales (at 142 lbs on office scales). Was continued on her current medication regimen at that time, including Lasix 40mg  daily.  In talking with the patient and her caregiver today, she reports overall doing well from a cardiac perspective since her last office visit. She has been going to the [facility] 3 days per week and walking on the treadmill for 0.5 miles and using the exercise bike. She denies any chest pain or dyspnea on exertion when performing these activities.   Denies any recent orthopnea, PND, lower extremity edema, or palpitations. She does not weigh herself daily but says this has overall been stable. Does have an occasional "cheat meal" but mostly follows a low-sodium diet.    Past Medical History:  Diagnosis Date  . Candida esophagitis ([place])   . Chronic combined systolic (congestive) and diastolic (congestive) heart failure ([place])    a. 08/2016: echo showing EF of 45-50%, diffuse HK, Grade 1 DD, and mild MR  . CKD (chronic kidney disease), stage III ([place]) 03/09/2017  . Heart disease   . Hyperlipemia   . Hypertension   . Iron deficiency anemia   . Pneumonia   . Thyroid activity decreased   . Thyroid disease     Past  Surgical History:  Procedure Laterality Date  . COLONOSCOPY N/A 06/14/2016   Procedure: COLONOSCOPY;  Surgeon: [name], MD;  Location: [facility];  Service: Endoscopy;  Laterality: N/A;  . ESOPHAGOGASTRODUODENOSCOPY N/A 06/14/2016   Procedure: ESOPHAGOGASTRODUODENOSCOPY (EGD);  Surgeon: [name], MD;  Location: [facility];  Service: Endoscopy;  Laterality: N/A;  WITH DILATION   . TONSILLECTOMY    . [facility] SINUS      Current Medications: Outpatient Medications Prior to Visit  Medication Sig Dispense Refill  . acetaminophen (TYLENOL) 500 MG tablet Take 500 mg by mouth every 6 (six) hours as needed for mild pain.    . bisoprolol (ZEBETA) 5 MG tablet Take 1 tablet (5 mg total) by mouth daily. 30 tablet 2  . Calcium Citrate-Vitamin D (CALCIUM CITRATE + D PO) Take 1 tablet by mouth daily.    . cholecalciferol (VITAMIN D) 1000 units tablet Take 1,000 Units by mouth daily.    Marland Kitchen dextromethorphan-guaiFENesin (MUCINEX DM) 30-600 MG 12hr tablet Take 1 tablet by mouth 2 (two) times daily as needed for cough.    . furosemide (LASIX) 40 MG tablet Take 40 mg by mouth daily.    Marland Kitchen gabapentin (NEURONTIN) 300 MG capsule Take 300 mg by mouth at bedtime.    Marland Kitchen ipratropium-albuterol (DUONEB) 0.5-2.5 (3) MG/3ML SOLN Take 3 mLs by nebulization every 6 (six) hours. 360 mL   . levothyroxine (  SYNTHROID, LEVOTHROID) 50 MCG tablet Take 50 mcg by mouth daily.    . Multiple Vitamins-Minerals (PRESERVISION AREDS PO) Take 1 capsule by mouth 2 (two) times daily.     . Omega-3 Fatty Acids (FISH OIL) 1000 MG CAPS Take 1,000 mg by mouth daily.    . pantoprazole (PROTONIX) 40 MG tablet Take 40 mg by mouth daily.    . potassium chloride SA (K-DUR,KLOR-CON) 20 MEQ tablet Take 20 mEq by mouth daily.    . pravastatin (PRAVACHOL) 40 MG tablet Take 40 mg by mouth daily.  3  . traZODone (DESYREL) 50 MG tablet Take 1 tablet (50 mg total) by mouth at bedtime as needed for sleep. 30 tablet 3  . furosemide (LASIX) 40 MG  tablet Take 1 tablet (40 mg total) by mouth 2 (two) times daily. 30 tablet   . HYDROcodone-acetaminophen (NORCO/VICODIN) 5-325 MG tablet One tablet every four hours for pain. 30 tablet 0  . pantoprazole (PROTONIX) 40 MG tablet Take 1 tablet (40 mg total) by mouth 2 (two) times daily before a meal. (Patient taking differently: Take 40 mg by mouth daily. ) 60 tablet 3  . Polysacchar Iron-FA-B12 (FERREX 150 FORTE) 150-1-25 MG-MG-MCG CAPS Take 1 capsule by mouth daily. 30 capsule 11  . potassium chloride SA (K-DUR,KLOR-CON) 20 MEQ tablet Take 1 tablet (20 mEq total) by mouth 2 (two) times daily.    Marland Kitchen torsemide (DEMADEX) 20 MG tablet      No facility-administered medications prior to visit.      Allergies:   Patient has no known allergies.   Social History   Socioeconomic History  . Marital status: Married    Spouse name: Not on file  . Number of children: Not on file  . Years of education: Not on file  . Highest education level: Not on file  Occupational History  . Not on file  Social Needs  . Financial resource strain: Not on file  . Food insecurity:    Worry: Not on file    Inability: Not on file  . Transportation needs:    Medical: Not on file    Non-medical: Not on file  Tobacco Use  . Smoking status: Never Smoker  . Smokeless tobacco: Never Used  Substance and Sexual Activity  . Alcohol use: No  . Drug use: No  . Sexual activity: Not Currently  Lifestyle  . Physical activity:    Days per week: Not on file    Minutes per session: Not on file  . Stress: Not on file  Relationships  . Social connections:    Talks on phone: Not on file    Gets together: Not on file    Attends religious service: Not on file    Active member of club or organization: Not on file    Attends meetings of clubs or organizations: Not on file    Relationship status: Not on file  Other Topics Concern  . Not on file  Social History Narrative  . Not on file     Family History:  The patient's  family history includes Asthma in her mother; Congestive Heart Failure in her mother; Emphysema in her father; Heart Problems in her brother.   Review of Systems:   Please see the history of present illness.     General:  No chills, fever, night sweats or weight changes.  Cardiovascular:  No chest pain, dyspnea on exertion, orthopnea, palpitations, paroxysmal nocturnal dyspnea. Positive for occasional lower extremity edema.  Dermatological: No  rash, lesions/masses Respiratory: No cough, dyspnea Urologic: No hematuria, dysuria Abdominal:   No nausea, vomiting, diarrhea, bright red blood per rectum, melena, or hematemesis Neurologic:  No visual changes, wkns, changes in mental status.  All other systems reviewed and are otherwise negative except as noted above.   Physical Exam:    VS:  BP 118/62   Pulse 61   Ht 5\' 7"  (1.702 m)   Wt 145 lb (65.8 kg)   SpO2 95%   BMI 22.71 kg/m    General: Well developed, elderly Caucasian female appearing in no acute distress. Appears younger than stated age.  Head: Normocephalic, atraumatic, sclera non-icteric, no xanthomas, nares are without discharge.  Neck: No carotid bruits. JVD not elevated.  Lungs: Respirations regular and unlabored, without wheezes or rales.  Heart: Regular rate and rhythm. No S3 or S4.  No murmur, no rubs, or gallops appreciated. Abdomen: Soft, non-tender, non-distended with normoactive bowel sounds. No hepatomegaly. No rebound/guarding. No obvious abdominal masses. Msk:  Strength and tone appear normal for age. No joint deformities or effusions. Extremities: No clubbing or cyanosis. No lower extremity edema.  Distal pedal pulses are 2+ bilaterally. Neuro: Alert and oriented X 3. Moves all extremities spontaneously. No focal deficits noted. Psych:  Responds to questions appropriately with a normal affect. Skin: No rashes or lesions noted  Wt Readings from Last 3 Encounters:  03/21/18 145 lb (65.8 kg)  01/17/18 142 lb  (64.4 kg)  11/20/17 143 lb (64.9 kg)     Studies/Labs Reviewed:   EKG:  EKG is ordered today.  The ekg ordered today demonstrates NSR, HR 61, with occasional PVC's. Incomplete RBBB.   Recent Labs: 04/06/2017: ALT 26 07/09/2017: Magnesium 2.3; TSH 6.030 09/27/2017: BUN 32; Creatinine, Ser 1.46; Hemoglobin 13.8; Platelets 330; Potassium 4.2; Sodium 140   Lipid Panel No results found for: CHOL, TRIG, HDL, CHOLHDL, VLDL, LDLCALC, LDLDIRECT  Additional studies/ records that were reviewed today include:   Echocardiogram: 08/2016 Study Conclusions  - Left ventricle: The cavity size was normal. Wall thickness was   increased in a pattern of mild LVH. Systolic function was mildly   reduced. The estimated ejection fraction was in the range of 45%   to 50%. Diffuse hypokinesis. There is moderate hypokinesis of the   basal-midinferolateral myocardium. Doppler parameters are   consistent with abnormal left ventricular relaxation (grade 1   diastolic dysfunction). - Aortic valve: Mildly calcified annulus. Trileaflet; mildly   calcified leaflets. There was mild regurgitation. Mean gradient   (S): 5 mm Hg. - Mitral valve: Calcified annulus. There was mild regurgitation. - Left atrium: The atrium was mildly dilated. - Right atrium: The atrium was at the upper limits of normal in   size. - Atrial septum: No defect or patent foramen ovale was identified. - Tricuspid valve: There was mild regurgitation. - Pulmonary arteries: PA peak pressure: 34 mm Hg (S). - Pericardium, extracardiac: A trivial pericardial effusion was   identified posterior to the heart.  Impressions:  - Mild LVH with LVEF 45-50% and diffuse hypokinesis, more prominent   in the mid to basal inferolateral wall. Grade 1 diastolic   dysfunction. Mild left atrial enlargement. Mildly calcified   mitral annulus with mild mitral regurgitation. Sclerotic aortic   valve with mild aortic regurgitation. Mild tricuspid    regurgitation with PASP estimated 34 mmHg. Trivial posterior   pericardial effusion noted.   Assessment:    1. Chronic combined systolic (congestive) and diastolic (congestive) heart failure ([place])   2.  Essential hypertension   3. Hyperlipidemia, unspecified hyperlipidemia type   4. Hypothyroidism, unspecified type   5. Stage 3 chronic kidney disease ([place])      Plan:   In order of problems listed above:  1. Chronic Combined Systolic and Diastolic CHF - EF mildly reduced to 45-50% by echo in 08/2016. Denies any dyspnea on exertion, orthopnea, PND, or lower extremity edema. Appears euvolemic by examination today.  - continue Bisoprolol 5mg  daily Lasix 40mg  daily. Not on ACE-I/ARB due to borderline EF and CKD.   2. HTN - BP is well-controlled at 118/62 during today's visit.  - continue Bisoprolol 5mg  daily.   3. HLD - followed by PCP. Remains on Pravastatin 40mg  daily.   4. Hypothyroidism - followed by PCP. Remains on Synthroid 50 mcg daily.   5. Stage 3 CKD - baseline creatinine 1.4 - 1.5. Stable at 1.46 on most recent check.     Medication Adjustments/Labs and Tests Ordered: Current medicines are reviewed at length with the patient today.  Concerns regarding medicines are outlined above.  Medication changes, Labs and Tests ordered today are listed in the Patient Instructions below. Patient Instructions  Medication Instructions:  Your physician recommends that you continue on your current medications as directed. Please refer to the Current Medication list given to you today.  If you need a refill on your cardiac medications before your next appointment, please call your pharmacy.   Lab work: NONE  If you have labs (blood work) drawn today and your tests are completely normal, you will receive your results only by: Marland Kitchen MyChart Message (if you have MyChart) OR . A paper copy in the mail If you have any lab test that is abnormal or we need to change your treatment, we will  call you to review the results.  Testing/Procedures: NONE   Follow-Up: At [facility], you and your health needs are our priority.  As part of our continuing mission to provide you with exceptional heart care, we have created designated Provider Care Teams.  These Care Teams include your primary Cardiologist (physician) and Advanced Practice Providers (APPs -  Physician Assistants and Nurse Practitioners) who all work together to provide you with the care you need, when you need it. You will need a follow up appointment in 6 months.  Please call our office 2 months in advance to schedule this appointment.  You may see [name], MD or one of the following Advanced Practice Providers on your designated Care Team:   Bernerd Pho, PA-C [facility]) . Ermalinda Barrios, PA-C ([place])  Any Other Special Instructions Will Be Listed Below (If Applicable). Thank you for choosing [facility]!     Signed, Erma Heritage, PA-C  03/21/2018 4:28 PM    [facility] [address] [place], [place] [address] Phone: [phone]

## 2018-03-21 ENCOUNTER — Ambulatory Visit (INDEPENDENT_AMBULATORY_CARE_PROVIDER_SITE_OTHER): Payer: Medicare Other | Admitting: Student

## 2018-03-21 ENCOUNTER — Encounter: Payer: Self-pay | Admitting: Student

## 2018-03-21 ENCOUNTER — Ambulatory Visit: Payer: Medicare Other | Admitting: Cardiovascular Disease

## 2018-03-21 VITALS — BP 118/62 | HR 61 | Ht 67.0 in | Wt 145.0 lb

## 2018-03-21 DIAGNOSIS — E039 Hypothyroidism, unspecified: Secondary | ICD-10-CM | POA: Diagnosis not present

## 2018-03-21 DIAGNOSIS — N183 Chronic kidney disease, stage 3 unspecified: Secondary | ICD-10-CM

## 2018-03-21 DIAGNOSIS — I5042 Chronic combined systolic (congestive) and diastolic (congestive) heart failure: Secondary | ICD-10-CM

## 2018-03-21 DIAGNOSIS — E785 Hyperlipidemia, unspecified: Secondary | ICD-10-CM | POA: Diagnosis not present

## 2018-03-21 DIAGNOSIS — I10 Essential (primary) hypertension: Secondary | ICD-10-CM | POA: Diagnosis not present

## 2018-03-21 NOTE — Patient Instructions (Signed)
Medication Instructions:  Your physician recommends that you continue on your current medications as directed. Please refer to the Current Medication list given to you today.  If you need a refill on your cardiac medications before your next appointment, please call your pharmacy.   Lab work: NONE  If you have labs (blood work) drawn today and your tests are completely normal, you will receive your results only by: Marland Kitchen MyChart Message (if you have MyChart) OR . A paper copy in the mail If you have any lab test that is abnormal or we need to change your treatment, we will call you to review the results.  Testing/Procedures: NONE   Follow-Up: At [facility], you and your health needs are our priority.  As part of our continuing mission to provide you with exceptional heart care, we have created designated Provider Care Teams.  These Care Teams include your primary Cardiologist (physician) and Advanced Practice Providers (APPs -  Physician Assistants and Nurse Practitioners) who all work together to provide you with the care you need, when you need it. You will need a follow up appointment in 6 months.  Please call our office 2 months in advance to schedule this appointment.  You may see [name], MD or one of the following Advanced Practice Providers on your designated Care Team:   Bernerd Pho, PA-C [facility]) . Ermalinda Barrios, PA-C ([place])  Any Other Special Instructions Will Be Listed Below (If Applicable). Thank you for choosing [facility]!

## 2018-04-10 ENCOUNTER — Encounter (INDEPENDENT_AMBULATORY_CARE_PROVIDER_SITE_OTHER): Payer: Medicare Other | Admitting: Ophthalmology

## 2018-04-10 DIAGNOSIS — H353231 Exudative age-related macular degeneration, bilateral, with active choroidal neovascularization: Secondary | ICD-10-CM

## 2018-04-10 DIAGNOSIS — H43813 Vitreous degeneration, bilateral: Secondary | ICD-10-CM | POA: Diagnosis not present

## 2018-05-09 ENCOUNTER — Encounter (INDEPENDENT_AMBULATORY_CARE_PROVIDER_SITE_OTHER): Payer: Medicare Other | Admitting: Ophthalmology

## 2018-05-09 DIAGNOSIS — H353231 Exudative age-related macular degeneration, bilateral, with active choroidal neovascularization: Secondary | ICD-10-CM | POA: Diagnosis not present

## 2018-05-09 DIAGNOSIS — H43813 Vitreous degeneration, bilateral: Secondary | ICD-10-CM | POA: Diagnosis not present

## 2018-06-06 ENCOUNTER — Encounter (INDEPENDENT_AMBULATORY_CARE_PROVIDER_SITE_OTHER): Payer: Medicare Other | Admitting: Ophthalmology

## 2018-06-06 DIAGNOSIS — H353231 Exudative age-related macular degeneration, bilateral, with active choroidal neovascularization: Secondary | ICD-10-CM | POA: Diagnosis not present

## 2018-06-06 DIAGNOSIS — H43813 Vitreous degeneration, bilateral: Secondary | ICD-10-CM | POA: Diagnosis not present

## 2018-07-04 ENCOUNTER — Encounter (INDEPENDENT_AMBULATORY_CARE_PROVIDER_SITE_OTHER): Payer: Medicare Other | Admitting: Ophthalmology

## 2018-07-04 DIAGNOSIS — H353231 Exudative age-related macular degeneration, bilateral, with active choroidal neovascularization: Secondary | ICD-10-CM

## 2018-07-04 DIAGNOSIS — H43813 Vitreous degeneration, bilateral: Secondary | ICD-10-CM

## 2018-08-01 ENCOUNTER — Encounter (INDEPENDENT_AMBULATORY_CARE_PROVIDER_SITE_OTHER): Payer: Medicare Other | Admitting: Ophthalmology

## 2018-08-01 DIAGNOSIS — H43813 Vitreous degeneration, bilateral: Secondary | ICD-10-CM

## 2018-08-01 DIAGNOSIS — H353231 Exudative age-related macular degeneration, bilateral, with active choroidal neovascularization: Secondary | ICD-10-CM

## 2018-08-29 ENCOUNTER — Other Ambulatory Visit: Payer: Self-pay

## 2018-08-29 ENCOUNTER — Encounter (INDEPENDENT_AMBULATORY_CARE_PROVIDER_SITE_OTHER): Payer: Medicare Other | Admitting: Ophthalmology

## 2018-08-29 DIAGNOSIS — H43813 Vitreous degeneration, bilateral: Secondary | ICD-10-CM

## 2018-08-29 DIAGNOSIS — H353231 Exudative age-related macular degeneration, bilateral, with active choroidal neovascularization: Secondary | ICD-10-CM | POA: Diagnosis not present

## 2018-09-10 ENCOUNTER — Telehealth: Payer: Self-pay | Admitting: *Deleted

## 2018-09-10 NOTE — Telephone Encounter (Signed)
Katelyn Daniel has been deemed a candidate for a follow-up tele-health visit to limit community exposure during the Covid-19 pandemic. I spoke with the patient via phone to ensure availability of phone/video source, confirm preferred email & phone number, and discuss instructions and expectations.  I reminded Katelyn Daniel to be prepared with any vital sign and/or heart rhythm information that could potentially be obtained via home monitoring, at the time of her visit. I reminded Katelyn Daniel to expect a phone call at the time of her visit if her visit.  Did the patient verbally acknowledge consent to treatment?Yes   Katelyn Santiago, LPN 09/08/5033 46:56 PM   DOWNLOADING [place], go to [employer] and type in WebEx in the search bar. [place] [employer], the blue/green circle. The app is free but as with any other app downloads, their phone may require them to verify saved payment information or Apple password. The patient does NOT have to create an account.  - If Android, ask patient to go to [employer] and type in WebEx in the search bar. [place] [employer], the blue/green circle. The app is free but as with any other app downloads, their phone may require them to verify saved payment information or Android password. The patient does NOT have to create an account.   CONSENT FOR TELE-HEALTH VISIT - PLEASE REVIEW  I hereby voluntarily request, consent and authorize [facility] and its employed or contracted physicians, physician assistants, nurse practitioners or other licensed health care professionals (the Practitioner), to provide me with telemedicine health care services (the "Services") as deemed necessary by the treating Practitioner. I acknowledge and consent to receive the Services by the Practitioner via telemedicine. I understand that the telemedicine visit will involve communicating with the Practitioner through  live audiovisual communication technology and the disclosure of certain medical information by electronic transmission. I acknowledge that I have been given the opportunity to request an in-person assessment or other available alternative prior to the telemedicine visit and am voluntarily participating in the telemedicine visit.  I understand that I have the right to withhold or withdraw my consent to the use of telemedicine in the course of my care at any time, without affecting my right to future care or treatment, and that the Practitioner or I may terminate the telemedicine visit at any time. I understand that I have the right to inspect all information obtained and/or recorded in the course of the telemedicine visit and may receive copies of available information for a reasonable fee.  I understand that some of the potential risks of receiving the Services via telemedicine include:  Marland Kitchen Delay or interruption in medical evaluation due to technological equipment failure or disruption; . Information transmitted may not be sufficient (e.g. poor resolution of images) to allow for appropriate medical decision making by the Practitioner; and/or  . In rare instances, security protocols could fail, causing a breach of personal health information.  Furthermore, I acknowledge that it is my responsibility to provide information about my medical history, conditions and care that is complete and accurate to the best of my ability. I acknowledge that Practitioner's advice, recommendations, and/or decision may be based on factors not within their control, such as incomplete or inaccurate data provided by me or distortions of diagnostic images or specimens that may result from electronic transmissions. I understand that the practice of medicine is not an exact science and that  Practitioner makes no warranties or guarantees regarding treatment outcomes. I acknowledge that I will receive a copy of this consent concurrently  upon execution via email to the email address I last provided but may also request a printed copy by calling the office of [place].    I understand that my insurance will be billed for this visit.   I have read or had this consent read to me. . I understand the contents of this consent, which adequately explains the benefits and risks of the Services being provided via telemedicine.  . I have been provided ample opportunity to ask questions regarding this consent and the Services and have had my questions answered to my satisfaction. . I give my informed consent for the services to be provided through the use of telemedicine in my medical care  By participating in this telemedicine visit I agree to the above.

## 2018-09-16 ENCOUNTER — Telehealth: Payer: Self-pay

## 2018-09-16 NOTE — Telephone Encounter (Signed)
Called pt. Went over medications. She will have bp taken tomorrow prior to virtual visit. Pharmacy current.     Virtual Visit Pre-Appointment Phone Call  Steps For Call:  1. Confirm consent - "In the setting of the current Covid19 crisis, you are scheduled for a (phone or video) visit with your provider on (date) at (time).  Just as we do with many in-office visits, in order for you to participate in this visit, we must obtain consent.  If you'd like, I can send this to your mychart (if signed up) or email for you to review.  Otherwise, I can obtain your verbal consent now.  All virtual visits are billed to your insurance company just like a normal visit would be.  By agreeing to a virtual visit, we'd like you to understand that the technology does not allow for your provider to perform an examination, and thus may limit your provider's ability to fully assess your condition.  Finally, though the technology is pretty good, we cannot assure that it will always work on either your or our end, and in the setting of a video visit, we may have to convert it to a phone-only visit.  In either situation, we cannot ensure that we have a secure connection.  Are you willing to proceed?"  2. Give patient instructions for WebEx download to smartphone as below if video visit  3. Advise patient to be prepared with any vital sign or heart rhythm information, their current medicines, and a piece of paper and pen handy for any instructions they may receive the day of their visit  4. Inform patient they will receive a phone call 15 minutes prior to their appointment time (may be from unknown caller ID) so they should be prepared to answer  5. Confirm that appointment type is correct in Epic appointment notes (video vs telephone)    TELEPHONE CALL NOTE  Katelyn Daniel has been deemed a candidate for a follow-up tele-health visit to limit community exposure during the Covid-19 pandemic. I spoke with the patient  via phone to ensure availability of phone/video source, confirm preferred email & phone number, and discuss instructions and expectations.  I reminded Katelyn Daniel to be prepared with any vital sign and/or heart rhythm information that could potentially be obtained via home monitoring, at the time of her visit. I reminded Katelyn Daniel to expect a phone call at the time of her visit if her visit.  Did the patient verbally acknowledge consent to treatment? Yes   Drema Dallas, [place] 09/16/2018 10:59 AM   DOWNLOADING THE WEBEX SOFTWARE TO SMARTPHONE  - If Apple, go to [employer] and type in WebEx in the search bar. [place] [employer], the blue/green circle. The app is free but as with any other app downloads, their phone may require them to verify saved payment information or Apple password. The patient does NOT have to create an account.  - If Android, ask patient to go to [employer] and type in WebEx in the search bar. [place] [employer], the blue/green circle. The app is free but as with any other app downloads, their phone may require them to verify saved payment information or Android password. The patient does NOT have to create an account.   CONSENT FOR TELE-HEALTH VISIT - PLEASE REVIEW  I hereby voluntarily request, consent and authorize [facility] and its employed or contracted physicians, Engineer, materials, nurse practitioners or other licensed health care  professionals (the Practitioner), to provide me with telemedicine health care services (the "Services") as deemed necessary by the treating Practitioner. I acknowledge and consent to receive the Services by the Practitioner via telemedicine. I understand that the telemedicine visit will involve communicating with the Practitioner through live audiovisual communication technology and the disclosure of certain medical information by electronic transmission. I acknowledge that I have been given the  opportunity to request an in-person assessment or other available alternative prior to the telemedicine visit and am voluntarily participating in the telemedicine visit.  I understand that I have the right to withhold or withdraw my consent to the use of telemedicine in the course of my care at any time, without affecting my right to future care or treatment, and that the Practitioner or I may terminate the telemedicine visit at any time. I understand that I have the right to inspect all information obtained and/or recorded in the course of the telemedicine visit and may receive copies of available information for a reasonable fee.  I understand that some of the potential risks of receiving the Services via telemedicine include:  Marland Kitchen Delay or interruption in medical evaluation due to technological equipment failure or disruption; . Information transmitted may not be sufficient (e.g. poor resolution of images) to allow for appropriate medical decision making by the Practitioner; and/or  . In rare instances, security protocols could fail, causing a breach of personal health information.  Furthermore, I acknowledge that it is my responsibility to provide information about my medical history, conditions and care that is complete and accurate to the best of my ability. I acknowledge that Practitioner's advice, recommendations, and/or decision may be based on factors not within their control, such as incomplete or inaccurate data provided by me or distortions of diagnostic images or specimens that may result from electronic transmissions. I understand that the practice of medicine is not an exact science and that Practitioner makes no warranties or guarantees regarding treatment outcomes. I acknowledge that I will receive a copy of this consent concurrently upon execution via email to the email address I last provided but may also request a printed copy by calling the office of [facility].    I understand that  my insurance will be billed for this visit.   I have read or had this consent read to me. . I understand the contents of this consent, which adequately explains the benefits and risks of the Services being provided via telemedicine.  . I have been provided ample opportunity to ask questions regarding this consent and the Services and have had my questions answered to my satisfaction. . I give my informed consent for the services to be provided through the use of telemedicine in my medical care  By participating in this telemedicine visit I agree to the above.

## 2018-09-17 ENCOUNTER — Encounter: Payer: Self-pay | Admitting: Student

## 2018-09-17 ENCOUNTER — Telehealth (INDEPENDENT_AMBULATORY_CARE_PROVIDER_SITE_OTHER): Payer: Medicare Other | Admitting: Student

## 2018-09-17 VITALS — BP 141/75 | HR 65 | Wt 140.0 lb

## 2018-09-17 DIAGNOSIS — I5042 Chronic combined systolic (congestive) and diastolic (congestive) heart failure: Secondary | ICD-10-CM

## 2018-09-17 DIAGNOSIS — Z7189 Other specified counseling: Secondary | ICD-10-CM

## 2018-09-17 DIAGNOSIS — I10 Essential (primary) hypertension: Secondary | ICD-10-CM

## 2018-09-17 DIAGNOSIS — N183 Chronic kidney disease, stage 3 unspecified: Secondary | ICD-10-CM

## 2018-09-17 NOTE — Patient Instructions (Signed)
Medication Instructions:  Your physician recommends that you continue on your current medications as directed. Please refer to the Current Medication list given to you today.   Labwork: None   Testing/Procedures: None  Follow-Up: Your physician wants you to follow-up in: 6 Months with Dr [name]. You will receive a reminder letter in the mail two months in advance. If you don't receive a letter, please call our office to schedule the follow-up appointment.   Any Other Special Instructions Will Be Listed Below (If Applicable).     If you need a refill on your cardiac medications before your next appointment, please call your pharmacy. Thank you for choosing [facility]!

## 2018-09-17 NOTE — Progress Notes (Signed)
Virtual Visit via Telephone Note   This visit type was conducted due to national recommendations for restrictions regarding the COVID-19 Pandemic (e.g. social distancing) in an effort to limit this patient's exposure and mitigate transmission in our community. Due to her co-morbid illnesses, this patient is at least at moderate risk for complications without adequate follow up.  This format is felt to be most appropriate for this patient at this time.  The patient did not have access to video technology/had technical difficulties with video requiring transitioning to audio format only (telephone).  All issues noted in this document were discussed and addressed.  No physical exam could be performed with this format.  Please refer to the patient's chart for her consent to telehealth for [facility].   Evaluation Performed:  Follow-up visit  Date:  09/17/2018   ID:  Katelyn Daniel, DOB 13-Dec-1923, MRN [id number]  Patient Location: Home  Provider Location: Home  PCP:  Lucia Gaskins, MD  Cardiologist:  [name], MD  Electrophysiologist:  None   Chief Complaint: 68-month Follow-up  History of Present Illness:    Katelyn Daniel is a 85 y.o. female who presents via audio/video conferencing for a telehealth visit today. Past medical history includes chronic combined systolic and diastolic CHF (EF 83-15% by echo in 08/2016), HTN, HLD,Hypothyroidism,COPD, and Stage 3 CKD.   She was last examined by myself in 03/2018 and was overall doing well at that time. Was exercising at the [facility] 3 times per week without any anginal symptoms. Weight was stable at 145 lbs and she was continued on her current regimen at that time including Bisoprolol 5mg  daily and Lasix 40mg  daily. Previously did not tolerant low-dose ACE-I due to hypotension.   In talking with the patient and her caregiver today, she reports overall doing well from a cardiac perspective since her last office visit. She has not been  able to go to the [facility] due to the Coronavirus but she has been walking around her neighborhood for exercise. Denies any recent chest pain or dyspnea on exertion. No recent orthopnea, PND, or lower extremity edema. Weight has been stable on her home scales.   She does report having nausea starting around lunchtime. Denies any associated headaches, vision changes, or weakness. Has consumed Ginger Ale with some improvement in her symptoms. Already contacted her PCP to obtain refills of her nausea medication. No associated diarrhea, constipation, or abdominal pain.   The patient does not have symptoms concerning for COVID-19 infection (fever, chills, cough, or new shortness of breath).    Past Medical History:  Diagnosis Date  . Candida esophagitis ([place])   . Chronic combined systolic (congestive) and diastolic (congestive) heart failure ([place])    a. 08/2016: echo showing EF of 45-50%, diffuse HK, Grade 1 DD, and mild MR  . CKD (chronic kidney disease), stage III ([place]) 03/09/2017  . Heart disease   . Hyperlipemia   . Hypertension   . Iron deficiency anemia   . Pneumonia   . Thyroid activity decreased   . Thyroid disease    Past Surgical History:  Procedure Laterality Date  . COLONOSCOPY N/A 06/14/2016   Procedure: COLONOSCOPY;  Surgeon: [name], MD;  [facility];  Service: Endoscopy;  Laterality: N/A;  . ESOPHAGOGASTRODUODENOSCOPY N/A 06/14/2016   Procedure: ESOPHAGOGASTRODUODENOSCOPY (EGD);  Surgeon: [name], MD;  [facility];  Service: Endoscopy;  Laterality: N/A;  WITH DILATION   . TONSILLECTOMY    . [place]  SINUS       Current Meds  Medication Sig  . acetaminophen (TYLENOL) 500 MG tablet Take 500 mg by mouth every 6 (six) hours as needed for mild pain.  . bisoprolol (ZEBETA) 5 MG tablet Take 1 tablet (5 mg total) by mouth daily.  . Calcium Citrate-Vitamin D (CALCIUM CITRATE + D PO) Take 1 tablet by mouth daily.  . cholecalciferol (VITAMIN D) 1000  units tablet Take 1,000 Units by mouth daily.  . furosemide (LASIX) 40 MG tablet Take 40 mg by mouth daily.  Marland Kitchen gabapentin (NEURONTIN) 300 MG capsule Take 300 mg by mouth at bedtime.  Marland Kitchen ipratropium-albuterol (DUONEB) 0.5-2.5 (3) MG/3ML SOLN Take 3 mLs by nebulization every 6 (six) hours.  Marland Kitchen levothyroxine (SYNTHROID, LEVOTHROID) 50 MCG tablet Take 50 mcg by mouth daily.  . Multiple Vitamins-Minerals (PRESERVISION AREDS PO) Take 1 capsule by mouth 2 (two) times daily.   . Omega-3 Fatty Acids (FISH OIL) 1000 MG CAPS Take 1,000 mg by mouth daily.  . pantoprazole (PROTONIX) 40 MG tablet Take 40 mg by mouth daily.  . potassium chloride SA (K-DUR,KLOR-CON) 20 MEQ tablet Take 20 mEq by mouth daily.  . pravastatin (PRAVACHOL) 40 MG tablet Take 40 mg by mouth daily.  . traZODone (DESYREL) 50 MG tablet Take 1 tablet (50 mg total) by mouth at bedtime as needed for sleep.     Allergies:   Patient has no known allergies.   Social History   Tobacco Use  . Smoking status: Never Smoker  . Smokeless tobacco: Never Used  Substance Use Topics  . Alcohol use: No  . Drug use: No     Family Hx: The patient's family history includes Asthma in her mother; Congestive Heart Failure in her mother; Emphysema in her father; Heart Problems in her brother. There is no history of Colon cancer.  ROS:   Please see the history of present illness.     All other systems reviewed and are negative.   Prior CV studies:   The following studies were reviewed today:  Echocardiogram: 08/2016 Study Conclusions  - Left ventricle: The cavity size was normal. Wall thickness was   increased in a pattern of mild LVH. Systolic function was mildly   reduced. The estimated ejection fraction was in the range of 45%   to 50%. Diffuse hypokinesis. There is moderate hypokinesis of the   basal-midinferolateral myocardium. Doppler parameters are   consistent with abnormal left ventricular relaxation (grade 1   diastolic  dysfunction). - Aortic valve: Mildly calcified annulus. Trileaflet; mildly   calcified leaflets. There was mild regurgitation. Mean gradient   (S): 5 mm Hg. - Mitral valve: Calcified annulus. There was mild regurgitation. - Left atrium: The atrium was mildly dilated. - Right atrium: The atrium was at the upper limits of normal in   size. - Atrial septum: No defect or patent foramen ovale was identified. - Tricuspid valve: There was mild regurgitation. - Pulmonary arteries: PA peak pressure: 34 mm Hg (S). - Pericardium, extracardiac: A trivial pericardial effusion was   identified posterior to the heart.  Impressions:  - Mild LVH with LVEF 45-50% and diffuse hypokinesis, more prominent   in the mid to basal inferolateral wall. Grade 1 diastolic   dysfunction. Mild left atrial enlargement. Mildly calcified   mitral annulus with mild mitral regurgitation. Sclerotic aortic   valve with mild aortic regurgitation. Mild tricuspid   regurgitation with PASP estimated 34 mmHg. Trivial posterior   pericardial effusion noted.  Labs/Other Tests  and Data Reviewed:    EKG:  No ECG reviewed.  Recent Labs: 09/27/2017: BUN 32; Creatinine, Ser 1.46; Hemoglobin 13.8; Platelets 330; Potassium 4.2; Sodium 140   Recent Lipid Panel No results found for: CHOL, TRIG, HDL, CHOLHDL, LDLCALC, LDLDIRECT  Wt Readings from Last 3 Encounters:  09/17/18 140 lb (63.5 kg)  03/21/18 145 lb (65.8 kg)  01/17/18 142 lb (64.4 kg)     Objective:    Vital Signs:  BP (!) 141/75   Pulse 65   Wt 140 lb (63.5 kg)   BMI 21.93 kg/m    General: Pleasant female sounding in NAD Psych: Normal affect. Neuro: Alert and oriented X 3. Moves all extremities spontaneously. Lungs:  Respirations do not sound labored while talking on the phone.   ASSESSMENT & PLAN:    1. Chronic Combined Systolic and Diastolic CHF - she had a known reduced EF of 45-50% by echo in 08/2016. Denies any recent orthopnea, PND, or edema and  weight has been stable on her home scales.  - continue current diuretic dosing with Lasix 40mg  daily. Remains on BB therapy. Previously intolerant to low-dose ACE-I given hypotension and orthostatic symptoms. A conservative approach has been pursued given her asymptomatic state and advanced age.   2. HTN - BP elevated at 141/75 this AM but she reports consuming [place] this morning and says SBP is typically in the 110's. Was encouraged to continue to follow BP regularly. Continue Bisoprolol 5mg  daily.    3. Stage 3 CKD - baseline creatinine at 1.4 - 1.6. Most recent labs in 2019 showed creatinine at 1.46. Will request most recent labs from PCP.   4. COVID-19 Education: The signs and symptoms of COVID-19 were discussed with the patient. The importance of social distancing was discussed today.   Time:   Today, I have spent 15 minutes with the patient with telehealth technology discussing the above problems.     Medication Adjustments/Labs and Tests Ordered: Current medicines are reviewed at length with the patient today.  Concerns regarding medicines are outlined above.   Tests Ordered: No orders of the defined types were placed in this encounter.   Medication Changes: No orders of the defined types were placed in this encounter.   Disposition:  Follow up with Dr. [name] in 6 months.   Signed, Erma Heritage, PA-C  09/17/2018 6:49 PM    [facility]

## 2018-09-26 ENCOUNTER — Other Ambulatory Visit: Payer: Self-pay

## 2018-09-26 ENCOUNTER — Encounter (INDEPENDENT_AMBULATORY_CARE_PROVIDER_SITE_OTHER): Payer: Medicare Other | Admitting: Ophthalmology

## 2018-09-26 DIAGNOSIS — H353231 Exudative age-related macular degeneration, bilateral, with active choroidal neovascularization: Secondary | ICD-10-CM | POA: Diagnosis not present

## 2018-09-26 DIAGNOSIS — H43813 Vitreous degeneration, bilateral: Secondary | ICD-10-CM

## 2018-09-29 ENCOUNTER — Encounter: Payer: Self-pay | Admitting: Orthopaedic Surgery

## 2018-10-24 ENCOUNTER — Other Ambulatory Visit: Payer: Self-pay

## 2018-10-24 ENCOUNTER — Encounter (INDEPENDENT_AMBULATORY_CARE_PROVIDER_SITE_OTHER): Payer: Medicare Other | Admitting: Ophthalmology

## 2018-10-24 DIAGNOSIS — H353231 Exudative age-related macular degeneration, bilateral, with active choroidal neovascularization: Secondary | ICD-10-CM

## 2018-10-24 DIAGNOSIS — H43813 Vitreous degeneration, bilateral: Secondary | ICD-10-CM | POA: Diagnosis not present

## 2018-11-22 ENCOUNTER — Encounter (INDEPENDENT_AMBULATORY_CARE_PROVIDER_SITE_OTHER): Payer: Medicare Other | Admitting: Ophthalmology

## 2018-11-22 ENCOUNTER — Other Ambulatory Visit: Payer: Self-pay

## 2018-11-22 DIAGNOSIS — H43813 Vitreous degeneration, bilateral: Secondary | ICD-10-CM | POA: Diagnosis not present

## 2018-11-22 DIAGNOSIS — H353231 Exudative age-related macular degeneration, bilateral, with active choroidal neovascularization: Secondary | ICD-10-CM

## 2018-12-20 ENCOUNTER — Other Ambulatory Visit: Payer: Self-pay

## 2018-12-20 ENCOUNTER — Encounter (INDEPENDENT_AMBULATORY_CARE_PROVIDER_SITE_OTHER): Payer: Medicare Other | Admitting: Ophthalmology

## 2018-12-20 DIAGNOSIS — H43813 Vitreous degeneration, bilateral: Secondary | ICD-10-CM

## 2018-12-20 DIAGNOSIS — H353231 Exudative age-related macular degeneration, bilateral, with active choroidal neovascularization: Secondary | ICD-10-CM

## 2019-01-23 ENCOUNTER — Encounter (INDEPENDENT_AMBULATORY_CARE_PROVIDER_SITE_OTHER): Payer: Medicare Other | Admitting: Ophthalmology

## 2019-01-23 ENCOUNTER — Other Ambulatory Visit: Payer: Self-pay

## 2019-01-23 DIAGNOSIS — H43813 Vitreous degeneration, bilateral: Secondary | ICD-10-CM

## 2019-01-23 DIAGNOSIS — H353231 Exudative age-related macular degeneration, bilateral, with active choroidal neovascularization: Secondary | ICD-10-CM | POA: Diagnosis not present

## 2019-01-24 ENCOUNTER — Encounter (INDEPENDENT_AMBULATORY_CARE_PROVIDER_SITE_OTHER): Payer: Medicare Other | Admitting: Ophthalmology

## 2019-02-27 ENCOUNTER — Other Ambulatory Visit: Payer: Self-pay

## 2019-02-27 ENCOUNTER — Encounter (INDEPENDENT_AMBULATORY_CARE_PROVIDER_SITE_OTHER): Payer: Medicare Other | Admitting: Ophthalmology

## 2019-02-27 DIAGNOSIS — H43813 Vitreous degeneration, bilateral: Secondary | ICD-10-CM | POA: Diagnosis not present

## 2019-02-27 DIAGNOSIS — H353231 Exudative age-related macular degeneration, bilateral, with active choroidal neovascularization: Secondary | ICD-10-CM | POA: Diagnosis not present

## 2019-03-11 NOTE — Progress Notes (Signed)
Evaluation Performed:  Follow-up visit  Date:  03/17/2019   ID:  Katelyn Daniel, DOB Feb 24, 1924, MRN [id number]  Provider Location: Home  PCP:  Lucia Gaskins, MD  Cardiologist:  [name], MD  Electrophysiologist:  None   Chief Complaint: 43-month Follow-up  History of Present Illness:    Katelyn Daniel is a 85 y.o. female who presents for f/u visit today. Past medical history includes chronic combined systolic and diastolic CHF (EF 99-83% by echo in 08/2016), HTN, HLD,Hypothyroidism,COPD, and Stage 3 CKD.   Compliant with med . Previously did not tolerant low-dose ACE-I due to hypotension.   She has not been able to go to the [facility] due to the Coronavirus but she has been walking around her neighborhood for exercise. Denies any recent chest pain or dyspnea on exertion. No recent orthopnea, PND, or lower extremity edema. Weight has been stable on her home scales.   Some lunch time nausea   Has aid Latoya that is with her during day. Daughter in [place] and Son in [place]  The patient does not have symptoms concerning for COVID-19 infection (fever, chills, cough, or new shortness of breath).    Past Medical History:  Diagnosis Date  . Candida esophagitis ([employer])   . Chronic combined systolic (congestive) and diastolic (congestive) heart failure ([place])    a. 08/2016: echo showing EF of 45-50%, diffuse HK, Grade 1 DD, and mild MR  . CKD (chronic kidney disease), stage III 03/09/2017  . Heart disease   . Hyperlipemia   . Hypertension   . Iron deficiency anemia   . Pneumonia   . Thyroid activity decreased   . Thyroid disease    Past Surgical History:  Procedure Laterality Date  . COLONOSCOPY N/A 06/14/2016   Procedure: COLONOSCOPY;  Surgeon: [name], MD;  [facility];  Service: Endoscopy;  Laterality: N/A;  . ESOPHAGOGASTRODUODENOSCOPY N/A 06/14/2016   Procedure: ESOPHAGOGASTRODUODENOSCOPY (EGD);  Surgeon: [name], MD;  [facility];  Service: Endoscopy;  Laterality: N/A;  WITH DILATION   . TONSILLECTOMY    . [place] SINUS       Current Meds  Medication Sig  . acetaminophen (TYLENOL) 500 MG tablet Take 500 mg by mouth every 6 (six) hours as needed for mild pain.  . bisoprolol (ZEBETA) 5 MG tablet Take 1 tablet (5 mg total) by mouth daily.  . Calcium Citrate-Vitamin D (CALCIUM CITRATE + D PO) Take 1 tablet by mouth daily.  . cholecalciferol (VITAMIN D) 1000 units tablet Take 1,000 Units by mouth daily.  . furosemide (LASIX) 40 MG tablet Take 40 mg by mouth daily.  Marland Kitchen gabapentin (NEURONTIN) 300 MG capsule Take 300 mg by mouth at bedtime.  Marland Kitchen ipratropium-albuterol (DUONEB) 0.5-2.5 (3) MG/3ML SOLN Take 3 mLs by nebulization every 6 (six) hours.  Marland Kitchen levothyroxine (SYNTHROID, LEVOTHROID) 50 MCG tablet Take 50 mcg by mouth daily.  . Multiple Vitamins-Minerals (PRESERVISION AREDS PO) Take 1 capsule by mouth 2 (two) times daily.   . Omega-3 Fatty Acids (FISH OIL) 1000 MG CAPS Take 1,000 mg by mouth daily.  . pantoprazole (PROTONIX) 40 MG tablet Take 40 mg by mouth daily.  . potassium chloride SA (K-DUR,KLOR-CON) 20 MEQ tablet Take 20 mEq by mouth daily.  . pravastatin (PRAVACHOL) 40 MG tablet Take 40 mg by mouth daily.  . traZODone (DESYREL) 50 MG tablet Take 1 tablet (50 mg total) by mouth at bedtime as needed for sleep.  Allergies:   Patient has no known allergies.   Social History   Tobacco Use  . Smoking status: Never Smoker  . Smokeless tobacco: Never Used  Substance Use Topics  . Alcohol use: No  . Drug use: No     Family Hx: The patient's family history includes Asthma in her mother; Congestive Heart Failure in her mother; Emphysema in her father; Heart Problems in her brother. There is no history of Colon cancer.  ROS:   Please see the history of present illness.     All other systems reviewed and are negative.   Prior CV studies:   The following studies were reviewed today:  Echocardiogram:  08/2016 Study Conclusions  - Left ventricle: The cavity size was normal. Wall thickness was   increased in a pattern of mild LVH. Systolic function was mildly   reduced. The estimated ejection fraction was in the range of 45%   to 50%. Diffuse hypokinesis. There is moderate hypokinesis of the   basal-midinferolateral myocardium. Doppler parameters are   consistent with abnormal left ventricular relaxation (grade 1   diastolic dysfunction). - Aortic valve: Mildly calcified annulus. Trileaflet; mildly   calcified leaflets. There was mild regurgitation. Mean gradient   (S): 5 mm Hg. - Mitral valve: Calcified annulus. There was mild regurgitation. - Left atrium: The atrium was mildly dilated. - Right atrium: The atrium was at the upper limits of normal in   size. - Atrial septum: No defect or patent foramen ovale was identified. - Tricuspid valve: There was mild regurgitation. - Pulmonary arteries: PA peak pressure: 34 mm Hg (S). - Pericardium, extracardiac: A trivial pericardial effusion was   identified posterior to the heart.  Impressions:  - Mild LVH with LVEF 45-50% and diffuse hypokinesis, more prominent   in the mid to basal inferolateral wall. Grade 1 diastolic   dysfunction. Mild left atrial enlargement. Mildly calcified   mitral annulus with mild mitral regurgitation. Sclerotic aortic   valve with mild aortic regurgitation. Mild tricuspid   regurgitation with PASP estimated 34 mmHg. Trivial posterior   pericardial effusion noted.  Labs/Other Tests and Data Reviewed:    EKG:  No ECG reviewed.  Recent Labs: No results found for requested labs within last 8760 hours.   Recent Lipid Panel No results found for: CHOL, TRIG, HDL, CHOLHDL, LDLCALC, LDLDIRECT  Wt Readings from Last 3 Encounters:  03/17/19 152 lb (68.9 kg)  09/17/18 140 lb (63.5 kg)  03/21/18 145 lb (65.8 kg)     Objective:    Vital Signs:  BP 114/65   Pulse 67   Temp (!) 96.6 F (35.9 C)   Ht 5'  7.5" (1.715 m)   Wt 152 lb (68.9 kg)   BMI 23.46 kg/m    Affect appropriate Healthy:  appears stated age HEENT: normal Neck supple with no adenopathy JVP normal no bruits no thyromegaly Lungs clear with no wheezing and good diaphragmatic motion Heart:  S1/S2 no murmur, no rub, gallop or click PMI normal Abdomen: benighn, BS positve, no tenderness, no AAA no bruit.  No HSM or HJR Distal pulses intact with no bruits No edema Neuro non-focal Skin warm and dry Right leg weak with antalgic gait   ASSESSMENT & PLAN:    1. Chronic Combined Systolic and Diastolic CHF - she had a known reduced EF of 45-50% by echo in 08/2016.continue current meds   2. HTN - Well controlled.  Continue current medications and low sodium Dash type diet.  3. Stage 3 CKD - baseline creatinine at 1.4 - 1.6. Most recent labs in 2019 showed creatinine at 1.46. Will request most recent labs from PCP.   4. COVID-19 Education: The signs and symptoms of COVID-19 were discussed with the patient. The importance of social distancing was discussed today.  5. Gait:  Antalgic gait fall risk using 3 point cane consider PT/OT   Time:   Today, I have spent 30 minutes with patient     Medication Adjustments/Labs and Tests Ordered: Current medicines are reviewed at length with the patient today.  Concerns regarding medicines are outlined above.   Tests Ordered: No orders of the defined types were placed in this encounter.   Medication Changes: No orders of the defined types were placed in this encounter.   Disposition:  Follow up with me in a year   Signed, [name], MD  03/17/2019 9:06 AM    [place]

## 2019-03-17 ENCOUNTER — Ambulatory Visit (INDEPENDENT_AMBULATORY_CARE_PROVIDER_SITE_OTHER): Payer: Medicare Other | Admitting: Cardiovascular Disease

## 2019-03-17 ENCOUNTER — Other Ambulatory Visit: Payer: Self-pay

## 2019-03-17 ENCOUNTER — Encounter: Payer: Self-pay | Admitting: Cardiovascular Disease

## 2019-03-17 VITALS — BP 114/65 | HR 67 | Temp 96.6°F | Ht 67.5 in | Wt 152.0 lb

## 2019-03-17 DIAGNOSIS — I5020 Unspecified systolic (congestive) heart failure: Secondary | ICD-10-CM | POA: Diagnosis not present

## 2019-03-17 NOTE — Patient Instructions (Signed)

## 2019-03-25 ENCOUNTER — Encounter (INDEPENDENT_AMBULATORY_CARE_PROVIDER_SITE_OTHER): Payer: Medicare Other | Admitting: Ophthalmology

## 2019-04-01 ENCOUNTER — Other Ambulatory Visit: Payer: Self-pay

## 2019-04-01 ENCOUNTER — Encounter (INDEPENDENT_AMBULATORY_CARE_PROVIDER_SITE_OTHER): Payer: Medicare Other | Admitting: Ophthalmology

## 2019-04-01 DIAGNOSIS — H43813 Vitreous degeneration, bilateral: Secondary | ICD-10-CM | POA: Diagnosis not present

## 2019-04-01 DIAGNOSIS — H353231 Exudative age-related macular degeneration, bilateral, with active choroidal neovascularization: Secondary | ICD-10-CM | POA: Diagnosis not present

## 2019-04-29 ENCOUNTER — Encounter (INDEPENDENT_AMBULATORY_CARE_PROVIDER_SITE_OTHER): Payer: Medicare Other | Admitting: Ophthalmology

## 2019-04-30 ENCOUNTER — Encounter (INDEPENDENT_AMBULATORY_CARE_PROVIDER_SITE_OTHER): Payer: Medicare Other | Admitting: Ophthalmology

## 2019-04-30 DIAGNOSIS — H43813 Vitreous degeneration, bilateral: Secondary | ICD-10-CM | POA: Diagnosis not present

## 2019-04-30 DIAGNOSIS — H353231 Exudative age-related macular degeneration, bilateral, with active choroidal neovascularization: Secondary | ICD-10-CM

## 2019-05-28 ENCOUNTER — Encounter (INDEPENDENT_AMBULATORY_CARE_PROVIDER_SITE_OTHER): Payer: Medicare Other | Admitting: Ophthalmology

## 2019-05-28 DIAGNOSIS — H353231 Exudative age-related macular degeneration, bilateral, with active choroidal neovascularization: Secondary | ICD-10-CM

## 2019-05-28 DIAGNOSIS — H43813 Vitreous degeneration, bilateral: Secondary | ICD-10-CM | POA: Diagnosis not present

## 2019-06-04 ENCOUNTER — Ambulatory Visit: Payer: Medicare Other | Attending: Internal Medicine

## 2019-06-04 ENCOUNTER — Other Ambulatory Visit: Payer: Self-pay

## 2019-06-04 DIAGNOSIS — Z20822 Contact with and (suspected) exposure to covid-19: Secondary | ICD-10-CM

## 2019-06-05 LAB — NOVEL CORONAVIRUS, NAA: SARS-CoV-2, NAA: NOT DETECTED

## 2019-06-08 ENCOUNTER — Telehealth: Payer: Self-pay

## 2019-06-08 NOTE — Telephone Encounter (Signed)

## 2019-06-25 ENCOUNTER — Encounter (INDEPENDENT_AMBULATORY_CARE_PROVIDER_SITE_OTHER): Payer: Medicare Other | Admitting: Ophthalmology

## 2019-06-25 DIAGNOSIS — H353231 Exudative age-related macular degeneration, bilateral, with active choroidal neovascularization: Secondary | ICD-10-CM

## 2019-06-25 DIAGNOSIS — H43813 Vitreous degeneration, bilateral: Secondary | ICD-10-CM

## 2019-07-07 ENCOUNTER — Ambulatory Visit: Payer: Medicare Other

## 2019-07-23 ENCOUNTER — Encounter (INDEPENDENT_AMBULATORY_CARE_PROVIDER_SITE_OTHER): Payer: Medicare Other | Admitting: Ophthalmology

## 2019-07-23 DIAGNOSIS — H353231 Exudative age-related macular degeneration, bilateral, with active choroidal neovascularization: Secondary | ICD-10-CM

## 2019-07-23 DIAGNOSIS — H43813 Vitreous degeneration, bilateral: Secondary | ICD-10-CM | POA: Diagnosis not present

## 2019-08-20 ENCOUNTER — Encounter (INDEPENDENT_AMBULATORY_CARE_PROVIDER_SITE_OTHER): Payer: Medicare Other | Admitting: Ophthalmology

## 2019-08-20 ENCOUNTER — Other Ambulatory Visit: Payer: Self-pay

## 2019-08-20 DIAGNOSIS — H353231 Exudative age-related macular degeneration, bilateral, with active choroidal neovascularization: Secondary | ICD-10-CM

## 2019-08-20 DIAGNOSIS — H43813 Vitreous degeneration, bilateral: Secondary | ICD-10-CM | POA: Diagnosis not present

## 2019-09-16 ENCOUNTER — Encounter (INDEPENDENT_AMBULATORY_CARE_PROVIDER_SITE_OTHER): Payer: Medicare Other | Admitting: Ophthalmology

## 2019-09-23 ENCOUNTER — Encounter (INDEPENDENT_AMBULATORY_CARE_PROVIDER_SITE_OTHER): Payer: Medicare Other | Admitting: Ophthalmology

## 2019-09-23 ENCOUNTER — Other Ambulatory Visit: Payer: Self-pay

## 2019-09-23 DIAGNOSIS — H353231 Exudative age-related macular degeneration, bilateral, with active choroidal neovascularization: Secondary | ICD-10-CM

## 2019-09-23 DIAGNOSIS — H43813 Vitreous degeneration, bilateral: Secondary | ICD-10-CM

## 2019-10-21 ENCOUNTER — Other Ambulatory Visit: Payer: Self-pay

## 2019-10-21 ENCOUNTER — Encounter (INDEPENDENT_AMBULATORY_CARE_PROVIDER_SITE_OTHER): Payer: Medicare Other | Admitting: Ophthalmology

## 2019-10-21 DIAGNOSIS — H353231 Exudative age-related macular degeneration, bilateral, with active choroidal neovascularization: Secondary | ICD-10-CM

## 2019-10-21 DIAGNOSIS — H43813 Vitreous degeneration, bilateral: Secondary | ICD-10-CM | POA: Diagnosis not present

## 2019-11-18 ENCOUNTER — Encounter (INDEPENDENT_AMBULATORY_CARE_PROVIDER_SITE_OTHER): Payer: Medicare Other | Admitting: Ophthalmology

## 2019-11-18 ENCOUNTER — Other Ambulatory Visit: Payer: Self-pay

## 2019-11-18 DIAGNOSIS — H43813 Vitreous degeneration, bilateral: Secondary | ICD-10-CM

## 2019-11-18 DIAGNOSIS — H353231 Exudative age-related macular degeneration, bilateral, with active choroidal neovascularization: Secondary | ICD-10-CM

## 2019-12-23 ENCOUNTER — Encounter (INDEPENDENT_AMBULATORY_CARE_PROVIDER_SITE_OTHER): Payer: Medicare Other | Admitting: Ophthalmology

## 2019-12-23 ENCOUNTER — Other Ambulatory Visit: Payer: Self-pay

## 2019-12-23 DIAGNOSIS — H353231 Exudative age-related macular degeneration, bilateral, with active choroidal neovascularization: Secondary | ICD-10-CM | POA: Diagnosis not present

## 2019-12-23 DIAGNOSIS — H43813 Vitreous degeneration, bilateral: Secondary | ICD-10-CM | POA: Diagnosis not present

## 2020-01-20 ENCOUNTER — Other Ambulatory Visit: Payer: Self-pay

## 2020-01-20 ENCOUNTER — Encounter (INDEPENDENT_AMBULATORY_CARE_PROVIDER_SITE_OTHER): Payer: Medicare Other | Admitting: Ophthalmology

## 2020-01-20 DIAGNOSIS — H353231 Exudative age-related macular degeneration, bilateral, with active choroidal neovascularization: Secondary | ICD-10-CM | POA: Diagnosis not present

## 2020-01-20 DIAGNOSIS — H43813 Vitreous degeneration, bilateral: Secondary | ICD-10-CM

## 2020-02-24 ENCOUNTER — Encounter (INDEPENDENT_AMBULATORY_CARE_PROVIDER_SITE_OTHER): Payer: Medicare Other | Admitting: Ophthalmology

## 2020-02-24 ENCOUNTER — Other Ambulatory Visit: Payer: Self-pay

## 2020-02-24 DIAGNOSIS — H353231 Exudative age-related macular degeneration, bilateral, with active choroidal neovascularization: Secondary | ICD-10-CM

## 2020-02-24 DIAGNOSIS — H43813 Vitreous degeneration, bilateral: Secondary | ICD-10-CM

## 2020-03-30 ENCOUNTER — Other Ambulatory Visit: Payer: Self-pay

## 2020-03-30 ENCOUNTER — Encounter (INDEPENDENT_AMBULATORY_CARE_PROVIDER_SITE_OTHER): Payer: Medicare Other | Admitting: Ophthalmology

## 2020-03-30 DIAGNOSIS — H43813 Vitreous degeneration, bilateral: Secondary | ICD-10-CM | POA: Diagnosis not present

## 2020-03-30 DIAGNOSIS — H353231 Exudative age-related macular degeneration, bilateral, with active choroidal neovascularization: Secondary | ICD-10-CM | POA: Diagnosis not present

## 2020-04-01 ENCOUNTER — Ambulatory Visit: Payer: Medicare Other | Attending: Internal Medicine

## 2020-04-01 DIAGNOSIS — Z23 Encounter for immunization: Secondary | ICD-10-CM

## 2020-04-01 NOTE — Progress Notes (Signed)
   Covid-19 Vaccination Clinic  Name:  Katelyn Daniel    MRN: [id number] DOB: 07-11-1923  04/01/2020  Ms. Katelyn Daniel was observed post Covid-19 immunization for 15 minutes without incident. She was provided with Vaccine Information Sheet and instruction to access the V-Safe system.   Ms. Katelyn Daniel was instructed to call 911 with any severe reactions post vaccine: Marland Kitchen Difficulty breathing  . Swelling of face and throat  . A fast heartbeat  . A bad rash all over body  . Dizziness and weakness

## 2020-05-11 ENCOUNTER — Encounter (INDEPENDENT_AMBULATORY_CARE_PROVIDER_SITE_OTHER): Payer: Medicare Other | Admitting: Ophthalmology

## 2020-05-11 ENCOUNTER — Other Ambulatory Visit: Payer: Self-pay

## 2020-05-11 DIAGNOSIS — H353231 Exudative age-related macular degeneration, bilateral, with active choroidal neovascularization: Secondary | ICD-10-CM | POA: Diagnosis not present

## 2020-05-11 DIAGNOSIS — H43813 Vitreous degeneration, bilateral: Secondary | ICD-10-CM | POA: Diagnosis not present

## 2020-06-29 ENCOUNTER — Encounter (INDEPENDENT_AMBULATORY_CARE_PROVIDER_SITE_OTHER): Payer: Medicare Other | Admitting: Ophthalmology

## 2020-06-29 ENCOUNTER — Other Ambulatory Visit: Payer: Self-pay

## 2020-06-29 DIAGNOSIS — H353231 Exudative age-related macular degeneration, bilateral, with active choroidal neovascularization: Secondary | ICD-10-CM | POA: Diagnosis not present

## 2020-06-29 DIAGNOSIS — H43813 Vitreous degeneration, bilateral: Secondary | ICD-10-CM | POA: Diagnosis not present

## 2020-06-29 DIAGNOSIS — I10 Essential (primary) hypertension: Secondary | ICD-10-CM | POA: Diagnosis not present

## 2020-06-29 DIAGNOSIS — H35033 Hypertensive retinopathy, bilateral: Secondary | ICD-10-CM | POA: Diagnosis not present

## 2020-07-19 NOTE — Progress Notes (Signed)
Evaluation Performed:  Follow-up visit  Date:  07/20/2020   ID:  Katelyn Daniel, DOB 18-Jan-1924, MRN [id number]  Provider Location: Home  PCP:  Lucia Gaskins, MD  Cardiologist:  [name], MD  Electrophysiologist:  None   Chief Complaint: 64-month Follow-up  History of Present Illness:    Katelyn Daniel is a 87 y.o. female who presents for f/u visit today. Past medical history includes chronic combined systolic and diastolic CHF (EF 94-17% by echo in 08/2016), HTN, HLD,Hypothyroidism,COPD, and Stage 3 CKD.   Compliant with med . Previously did not tolerant low-dose ACE-I due to hypotension.   Back to [facility] walking/exercising a lot  Denies any recent chest pain or dyspnea on exertion. No recent orthopnea, PND, or lower extremity edema. Weight has been stable on her home scales.   Has aid Latoya that is with her during day. Daughter in [place] and Son in [place]  Primary is [facility] and he follows her labs   The patient does not have symptoms concerning for COVID-19 infection (fever, chills, cough, or new shortness of breath).    Past Medical History:  Diagnosis Date  . Candida esophagitis ([place])   . Chronic combined systolic (congestive) and diastolic (congestive) heart failure ([place])    a. 08/2016: echo showing EF of 45-50%, diffuse HK, Grade 1 DD, and mild MR  . CKD (chronic kidney disease), stage III ([place]) 03/09/2017  . Heart disease   . Hyperlipemia   . Hypertension   . Iron deficiency anemia   . Pneumonia   . Thyroid activity decreased   . Thyroid disease    Past Surgical History:  Procedure Laterality Date  . COLONOSCOPY N/A 06/14/2016   Procedure: COLONOSCOPY;  Surgeon: [name], MD;  [facility];  Service: Endoscopy;  Laterality: N/A;  . ESOPHAGOGASTRODUODENOSCOPY N/A 06/14/2016   Procedure: ESOPHAGOGASTRODUODENOSCOPY (EGD);  Surgeon: [name], MD;  Location: [facility];  Service: Endoscopy;  Laterality: N/A;  WITH DILATION    . TONSILLECTOMY    . [place] SINUS       Current Meds  Medication Sig  . acetaminophen (TYLENOL) 500 MG tablet Take 500 mg by mouth every 6 (six) hours as needed for mild pain.  . bisoprolol (ZEBETA) 5 MG tablet Take 1 tablet (5 mg total) by mouth daily.  . Calcium Citrate-Vitamin D (CALCIUM CITRATE + D PO) Take 1 tablet by mouth daily.  . cholecalciferol (VITAMIN D) 1000 units tablet Take 1,000 Units by mouth daily.  . furosemide (LASIX) 40 MG tablet Take 40 mg by mouth daily.  Marland Kitchen gabapentin (NEURONTIN) 300 MG capsule Take 300 mg by mouth at bedtime.  Marland Kitchen ipratropium-albuterol (DUONEB) 0.5-2.5 (3) MG/3ML SOLN Take 3 mLs by nebulization every 6 (six) hours.  Marland Kitchen levothyroxine (SYNTHROID, LEVOTHROID) 50 MCG tablet Take 50 mcg by mouth daily.  . Multiple Vitamins-Minerals (PRESERVISION AREDS PO) Take 1 capsule by mouth 2 (two) times daily.   . Omega-3 Fatty Acids (FISH OIL) 1000 MG CAPS Take 1,000 mg by mouth daily.  . pantoprazole (PROTONIX) 40 MG tablet Take 40 mg by mouth daily.  . potassium chloride SA (K-DUR,KLOR-CON) 20 MEQ tablet Take 20 mEq by mouth daily.  . pravastatin (PRAVACHOL) 40 MG tablet Take 40 mg by mouth daily.  . traZODone (DESYREL) 50 MG tablet Take 1 tablet (50 mg total) by mouth at bedtime as needed for sleep.     Allergies:   Patient has no known allergies.   Social  History   Tobacco Use  . Smoking status: Never Smoker  . Smokeless tobacco: Never Used  Vaping Use  . Vaping Use: Never used  Substance Use Topics  . Alcohol use: No  . Drug use: No     Family Hx: The patient's family history includes Asthma in her mother; Congestive Heart Failure in her mother; Emphysema in her father; Heart Problems in her brother. There is no history of Colon cancer.  ROS:   Please see the history of present illness.     All other systems reviewed and are negative.   Prior CV studies:   The following studies were reviewed today:  Echocardiogram: 08/2016 Study  Conclusions  - Left ventricle: The cavity size was normal. Wall thickness was   increased in a pattern of mild LVH. Systolic function was mildly   reduced. The estimated ejection fraction was in the range of 45%   to 50%. Diffuse hypokinesis. There is moderate hypokinesis of the   basal-midinferolateral myocardium. Doppler parameters are   consistent with abnormal left ventricular relaxation (grade 1   diastolic dysfunction). - Aortic valve: Mildly calcified annulus. Trileaflet; mildly   calcified leaflets. There was mild regurgitation. Mean gradient   (S): 5 mm Hg. - Mitral valve: Calcified annulus. There was mild regurgitation. - Left atrium: The atrium was mildly dilated. - Right atrium: The atrium was at the upper limits of normal in   size. - Atrial septum: No defect or patent foramen ovale was identified. - Tricuspid valve: There was mild regurgitation. - Pulmonary arteries: PA peak pressure: 34 mm Hg (S). - Pericardium, extracardiac: A trivial pericardial effusion was   identified posterior to the heart.  Impressions:  - Mild LVH with LVEF 45-50% and diffuse hypokinesis, more prominent   in the mid to basal inferolateral wall. Grade 1 diastolic   dysfunction. Mild left atrial enlargement. Mildly calcified   mitral annulus with mild mitral regurgitation. Sclerotic aortic   valve with mild aortic regurgitation. Mild tricuspid   regurgitation with PASP estimated 34 mmHg. Trivial posterior   pericardial effusion noted.  Labs/Other Tests and Data Reviewed:    EKG:  07/20/2020 SR rate 60 RBBB LAD no acute changes   Recent Labs: No results found for requested labs within last 8760 hours.   Recent Lipid Panel No results found for: CHOL, TRIG, HDL, CHOLHDL, LDLCALC, LDLDIRECT  Wt Readings from Last 3 Encounters:  07/20/20 62.1 kg  03/17/19 68.9 kg  09/17/18 63.5 kg     Objective:    Vital Signs:  BP 134/72   Pulse (!) 58   Ht 5' 7.5" (1.715 m)   Wt 62.1 kg   SpO2  98%   BMI 21.14 kg/m    Affect appropriate Healthy:  appears stated age HEENT: normal Neck supple with no adenopathy JVP normal no bruits no thyromegaly Lungs clear with no wheezing and good diaphragmatic motion Heart:  S1/S2 no murmur, no rub, gallop or click PMI normal Abdomen: benighn, BS positve, no tenderness, no AAA no bruit.  No HSM or HJR Distal pulses intact with no bruits No edema Neuro non-focal Skin warm and dry Right leg weak with antalgic gait   ASSESSMENT & PLAN:    1. Chronic Combined Systolic and Diastolic CHF - she had a known reduced EF of 45-50% by echo in 08/2016.continue current meds   2. HTN - Well controlled.  Continue current medications and low sodium Dash type diet.    3. Stage 3 CKD -  baseline creatinine at 1.4 - 1.6.    4. COVID-19 Education: The signs and symptoms of COVID-19 were discussed with the patient. The importance of social distancing was discussed today.  5. Gait:  Antalgic gait fall risk using 3 point cane consider PT/OT   Time:   Today, I have spent 30 minutes with patient     Medication Adjustments/Labs and Tests Ordered: Current medicines are reviewed at length with the patient today.  Concerns regarding medicines are outlined above.   Tests Ordered: No orders of the defined types were placed in this encounter.   Medication Changes: No orders of the defined types were placed in this encounter.   Disposition:  Follow up with me in a year   Signed, [name], MD  07/20/2020 9:02 AM    [employer]

## 2020-07-20 ENCOUNTER — Ambulatory Visit (INDEPENDENT_AMBULATORY_CARE_PROVIDER_SITE_OTHER): Payer: Medicare Other | Admitting: Cardiovascular Disease

## 2020-07-20 ENCOUNTER — Other Ambulatory Visit: Payer: Self-pay

## 2020-07-20 ENCOUNTER — Encounter: Payer: Self-pay | Admitting: Cardiovascular Disease

## 2020-07-20 VITALS — BP 134/72 | HR 58 | Ht 67.5 in | Wt 137.0 lb

## 2020-07-20 DIAGNOSIS — I5022 Chronic systolic (congestive) heart failure: Secondary | ICD-10-CM | POA: Diagnosis not present

## 2020-07-20 NOTE — Addendum Note (Signed)
Addended by: Barbarann Ehlers A on: 07/20/2020 10:56 AM   Modules accepted: Orders

## 2020-07-20 NOTE — Patient Instructions (Signed)
Medication Instructions:  Your physician recommends that you continue on your current medications as directed. Please refer to the Current Medication list given to you today.  *If you need a refill on your cardiac medications before your next appointment, please call your pharmacy*   Lab Work: None today If you have labs (blood work) drawn today and your tests are completely normal, you will receive your results only by: Marland Kitchen MyChart Message (if you have MyChart) OR . A paper copy in the mail If you have any lab test that is abnormal or we need to change your treatment, we will call you to review the results.   Testing/Procedures: None today   Follow-Up: At [facility], you and your health needs are our priority.  As part of our continuing mission to provide you with exceptional heart care, we have created designated Provider Care Teams.  These Care Teams include your primary Cardiologist (physician) and Advanced Practice Providers (APPs -  Physician Assistants and Nurse Practitioners) who all work together to provide you with the care you need, when you need it.  We recommend signing up for the patient portal called "MyChart".  Sign up information is provided on this After Visit Summary.  MyChart is used to connect with patients for Virtual Visits (Telemedicine).  Patients are able to view lab/test results, encounter notes, upcoming appointments, etc.  Non-urgent messages can be sent to your provider as well.   To learn more about what you can do with MyChart, go to [email or web address].    Your next appointment:   12 month(s)  The format for your next appointment:   In Person  Provider:   [name], MD   Other Instructions Thank you for choosing [facility] !

## 2020-08-17 ENCOUNTER — Other Ambulatory Visit: Payer: Self-pay

## 2020-08-17 ENCOUNTER — Encounter (INDEPENDENT_AMBULATORY_CARE_PROVIDER_SITE_OTHER): Payer: Medicare Other | Admitting: Ophthalmology

## 2020-08-17 DIAGNOSIS — H43813 Vitreous degeneration, bilateral: Secondary | ICD-10-CM | POA: Diagnosis not present

## 2020-08-17 DIAGNOSIS — H353231 Exudative age-related macular degeneration, bilateral, with active choroidal neovascularization: Secondary | ICD-10-CM

## 2020-08-17 DIAGNOSIS — I10 Essential (primary) hypertension: Secondary | ICD-10-CM | POA: Diagnosis not present

## 2020-08-17 DIAGNOSIS — H35033 Hypertensive retinopathy, bilateral: Secondary | ICD-10-CM | POA: Diagnosis not present

## 2020-10-05 ENCOUNTER — Encounter (INDEPENDENT_AMBULATORY_CARE_PROVIDER_SITE_OTHER): Payer: Medicare Other | Admitting: Ophthalmology

## 2020-10-05 ENCOUNTER — Other Ambulatory Visit: Payer: Self-pay

## 2020-10-05 DIAGNOSIS — H35033 Hypertensive retinopathy, bilateral: Secondary | ICD-10-CM

## 2020-10-05 DIAGNOSIS — H43813 Vitreous degeneration, bilateral: Secondary | ICD-10-CM | POA: Diagnosis not present

## 2020-10-05 DIAGNOSIS — H353231 Exudative age-related macular degeneration, bilateral, with active choroidal neovascularization: Secondary | ICD-10-CM | POA: Diagnosis not present

## 2020-10-05 DIAGNOSIS — I10 Essential (primary) hypertension: Secondary | ICD-10-CM

## 2020-11-27 ENCOUNTER — Emergency Department (HOSPITAL_COMMUNITY): Payer: Medicare Other

## 2020-11-27 ENCOUNTER — Encounter (HOSPITAL_COMMUNITY): Payer: Self-pay

## 2020-11-27 ENCOUNTER — Other Ambulatory Visit: Payer: Self-pay

## 2020-11-27 ENCOUNTER — Inpatient Hospital Stay (HOSPITAL_COMMUNITY)
Admission: EM | Admit: 2020-11-27 | Discharge: 2020-12-01 | DRG: 380 | Disposition: A | Discharge status: Hospice | Payer: Medicare Other | Attending: Internal Medicine | Admitting: Internal Medicine

## 2020-11-27 DIAGNOSIS — K2990 Gastroduodenitis, unspecified, without bleeding: Secondary | ICD-10-CM | POA: Diagnosis present

## 2020-11-27 DIAGNOSIS — R509 Fever, unspecified: Secondary | ICD-10-CM

## 2020-11-27 DIAGNOSIS — K828 Other specified diseases of gallbladder: Secondary | ICD-10-CM | POA: Diagnosis present

## 2020-11-27 DIAGNOSIS — N1832 Chronic kidney disease, stage 3b: Secondary | ICD-10-CM | POA: Diagnosis present

## 2020-11-27 DIAGNOSIS — K831 Obstruction of bile duct: Secondary | ICD-10-CM | POA: Diagnosis present

## 2020-11-27 DIAGNOSIS — R935 Abnormal findings on diagnostic imaging of other abdominal regions, including retroperitoneum: Secondary | ICD-10-CM

## 2020-11-27 DIAGNOSIS — R109 Unspecified abdominal pain: Secondary | ICD-10-CM | POA: Diagnosis not present

## 2020-11-27 DIAGNOSIS — H3530 Unspecified macular degeneration: Secondary | ICD-10-CM | POA: Diagnosis present

## 2020-11-27 DIAGNOSIS — G8929 Other chronic pain: Secondary | ICD-10-CM | POA: Diagnosis present

## 2020-11-27 DIAGNOSIS — I700 Atherosclerosis of aorta: Secondary | ICD-10-CM | POA: Diagnosis present

## 2020-11-27 DIAGNOSIS — K219 Gastro-esophageal reflux disease without esophagitis: Secondary | ICD-10-CM | POA: Diagnosis present

## 2020-11-27 DIAGNOSIS — Z8701 Personal history of pneumonia (recurrent): Secondary | ICD-10-CM

## 2020-11-27 DIAGNOSIS — B0229 Other postherpetic nervous system involvement: Secondary | ICD-10-CM | POA: Diagnosis present

## 2020-11-27 DIAGNOSIS — Z8249 Family history of ischemic heart disease and other diseases of the circulatory system: Secondary | ICD-10-CM

## 2020-11-27 DIAGNOSIS — R6510 Systemic inflammatory response syndrome (SIRS) of non-infectious origin without acute organ dysfunction: Secondary | ICD-10-CM | POA: Diagnosis present

## 2020-11-27 DIAGNOSIS — D509 Iron deficiency anemia, unspecified: Secondary | ICD-10-CM | POA: Diagnosis present

## 2020-11-27 DIAGNOSIS — E876 Hypokalemia: Secondary | ICD-10-CM | POA: Diagnosis present

## 2020-11-27 DIAGNOSIS — I130 Hypertensive heart and chronic kidney disease with heart failure and stage 1 through stage 4 chronic kidney disease, or unspecified chronic kidney disease: Secondary | ICD-10-CM | POA: Diagnosis present

## 2020-11-27 DIAGNOSIS — K838 Other specified diseases of biliary tract: Secondary | ICD-10-CM

## 2020-11-27 DIAGNOSIS — Z66 Do not resuscitate: Secondary | ICD-10-CM | POA: Diagnosis present

## 2020-11-27 DIAGNOSIS — Z8719 Personal history of other diseases of the digestive system: Secondary | ICD-10-CM

## 2020-11-27 DIAGNOSIS — R112 Nausea with vomiting, unspecified: Secondary | ICD-10-CM

## 2020-11-27 DIAGNOSIS — R748 Abnormal levels of other serum enzymes: Secondary | ICD-10-CM | POA: Diagnosis present

## 2020-11-27 DIAGNOSIS — K315 Obstruction of duodenum: Secondary | ICD-10-CM | POA: Diagnosis not present

## 2020-11-27 DIAGNOSIS — C250 Malignant neoplasm of head of pancreas: Secondary | ICD-10-CM | POA: Diagnosis present

## 2020-11-27 DIAGNOSIS — C786 Secondary malignant neoplasm of retroperitoneum and peritoneum: Secondary | ICD-10-CM | POA: Diagnosis present

## 2020-11-27 DIAGNOSIS — E039 Hypothyroidism, unspecified: Secondary | ICD-10-CM | POA: Diagnosis present

## 2020-11-27 DIAGNOSIS — Z20822 Contact with and (suspected) exposure to covid-19: Secondary | ICD-10-CM | POA: Diagnosis present

## 2020-11-27 DIAGNOSIS — Z79899 Other long term (current) drug therapy: Secondary | ICD-10-CM

## 2020-11-27 DIAGNOSIS — M549 Dorsalgia, unspecified: Secondary | ICD-10-CM | POA: Diagnosis present

## 2020-11-27 DIAGNOSIS — K8689 Other specified diseases of pancreas: Secondary | ICD-10-CM

## 2020-11-27 DIAGNOSIS — R531 Weakness: Secondary | ICD-10-CM | POA: Diagnosis present

## 2020-11-27 DIAGNOSIS — E785 Hyperlipidemia, unspecified: Secondary | ICD-10-CM | POA: Diagnosis present

## 2020-11-27 DIAGNOSIS — Z825 Family history of asthma and other chronic lower respiratory diseases: Secondary | ICD-10-CM

## 2020-11-27 DIAGNOSIS — I5042 Chronic combined systolic (congestive) and diastolic (congestive) heart failure: Secondary | ICD-10-CM | POA: Diagnosis present

## 2020-11-27 LAB — COMPREHENSIVE METABOLIC PANEL
ALT: 34 U/L (ref 0–44)
AST: 41 U/L (ref 15–41)
Albumin: 3.6 g/dL (ref 3.5–5.0)
Alkaline Phosphatase: 141 U/L — ABNORMAL HIGH (ref 38–126)
Anion gap: 10 (ref 5–15)
BUN: 19 mg/dL (ref 8–23)
CO2: 25 mmol/L (ref 22–32)
Calcium: 8.9 mg/dL (ref 8.9–10.3)
Chloride: 100 mmol/L (ref 98–111)
Creatinine, Ser: 1.17 mg/dL — ABNORMAL HIGH (ref 0.44–1.00)
GFR, Estimated: 43 mL/min — ABNORMAL LOW (ref >60)
Glucose, Bld: 195 mg/dL — ABNORMAL HIGH (ref 70–99)
Potassium: 3.2 mmol/L — ABNORMAL LOW (ref 3.5–5.1)
Sodium: 135 mmol/L (ref 135–145)
Total Bilirubin: 1 mg/dL (ref 0.3–1.2)
Total Protein: 6.1 g/dL — ABNORMAL LOW (ref 6.5–8.1)

## 2020-11-27 LAB — CBC WITH DIFFERENTIAL/PLATELET
Abs Immature Granulocytes: 0.05 10*3/uL (ref 0.00–0.07)
Basophils Absolute: 0 10*3/uL (ref 0.0–0.1)
Basophils Relative: 0 %
Eosinophils Absolute: 0 10*3/uL (ref 0.0–0.5)
Eosinophils Relative: 0 %
HCT: 39.2 % (ref 36.0–46.0)
Hemoglobin: 13.2 g/dL (ref 12.0–15.0)
Immature Granulocytes: 0 %
Lymphocytes Relative: 3 %
Lymphs Abs: 0.3 10*3/uL — ABNORMAL LOW (ref 0.7–4.0)
MCH: 33.8 pg (ref 26.0–34.0)
MCHC: 33.7 g/dL (ref 30.0–36.0)
MCV: 100.3 fL — ABNORMAL HIGH (ref 80.0–100.0)
Monocytes Absolute: 0.8 10*3/uL (ref 0.1–1.0)
Monocytes Relative: 7 %
Neutro Abs: 10.7 10*3/uL — ABNORMAL HIGH (ref 1.7–7.7)
Neutrophils Relative %: 90 %
Platelets: 254 10*3/uL (ref 150–400)
RBC: 3.91 MIL/uL (ref 3.87–5.11)
RDW: 14.6 % (ref 11.5–15.5)
WBC: 12 10*3/uL — ABNORMAL HIGH (ref 4.0–10.5)
nRBC: 0 % (ref 0.0–0.2)

## 2020-11-27 LAB — RESP PANEL BY RT-PCR (FLU A&B, COVID) ARPGX2
Influenza A by PCR: NEGATIVE
Influenza B by PCR: NEGATIVE
SARS Coronavirus 2 by RT PCR: NEGATIVE

## 2020-11-27 LAB — URINALYSIS, ROUTINE W REFLEX MICROSCOPIC
Bilirubin Urine: NEGATIVE
Glucose, UA: NEGATIVE mg/dL
Hgb urine dipstick: NEGATIVE
Ketones, ur: 5 mg/dL — AB
Leukocytes,Ua: NEGATIVE
Nitrite: NEGATIVE
Protein, ur: NEGATIVE mg/dL
Specific Gravity, Urine: 1.011 (ref 1.005–1.030)
pH: 5 (ref 5.0–8.0)

## 2020-11-27 LAB — LACTIC ACID, PLASMA
Lactic Acid, Venous: 1.6 mmol/L (ref 0.5–1.9)
Lactic Acid, Venous: 1.5 mmol/L (ref 0.5–1.9)

## 2020-11-27 LAB — CK: Total CK: 57 U/L (ref 38–234)

## 2020-11-27 LAB — APTT: aPTT: 31 seconds (ref 24–36)

## 2020-11-27 LAB — PROTIME-INR
INR: 1.1 (ref 0.8–1.2)
Prothrombin Time: 14.4 seconds (ref 11.4–15.2)

## 2020-11-27 LAB — LIPASE, BLOOD: Lipase: 30 U/L (ref 11–51)

## 2020-11-27 MED ORDER — SODIUM CHLORIDE 0.9 % IV BOLUS: 500.0000 mL | Freq: Once | INTRAVENOUS | Status: DC

## 2020-11-27 MED ORDER — LACTATED RINGERS IV BOLUS
1000.0000 mL | Freq: Once | INTRAVENOUS | Status: AC
  Administered 2020-11-27: 1000 mL via INTRAVENOUS

## 2020-11-27 MED ORDER — ONDANSETRON HCL 4 MG/2ML IJ SOLN
4.0000 mg | Freq: Once | INTRAMUSCULAR | Status: AC
  Administered 2020-11-27: 4 mg via INTRAVENOUS
  Filled 2020-11-27: qty 2

## 2020-11-27 MED ORDER — SODIUM CHLORIDE 0.9 % IV SOLN
1.0000 g | Freq: Once | INTRAVENOUS | Status: AC
  Administered 2020-11-27: 1 g via INTRAVENOUS
  Filled 2020-11-27: qty 10

## 2020-11-27 MED ORDER — ACETAMINOPHEN 325 MG PO TABS
650.0000 mg | ORAL_TABLET | Freq: Once | ORAL | Status: AC
  Administered 2020-11-27: 650 mg via ORAL
  Filled 2020-11-27: qty 2

## 2020-11-27 MED ORDER — POTASSIUM CHLORIDE 20 MEQ PO PACK
40.0000 meq | PACK | Freq: Once | ORAL | Status: AC
  Administered 2020-11-27: 40 meq via ORAL
  Filled 2020-11-27: qty 2

## 2020-11-27 NOTE — ED Notes (Signed)
Pt has DNR Form present in room upon this RNs assessment

## 2020-11-27 NOTE — ED Triage Notes (Signed)
Pt to er room number 10 via ems, per ems pt fell and was down for about 30 minutes, states that pt has a hx of shingles for the past 4 weeks, pt has emesis on pants, pt eyes open and sporadically answering questions.

## 2020-11-27 NOTE — ED Provider Notes (Signed)
[facility] Provider Note   CSN: [id number] Arrival date & time: 11/27/20  1730     History Chief Complaint  Patient presents with   Fall    Fever     Katelyn Daniel is a 87 y.o. female.  HPI  Patient is a 87 year old female who presents to the emergency department due to fall.  Patient sporadically answering questions with EMS as well as nursing staff.  On my exam she is A&O x3 but appears to sometimes have difficulty answering questions.  She is covered in dried vomit.  No difficulty moving extremities.  She denies any regions of pain to me.  I spoke to her son over the phone who provided most of her history.  He states that she has a caregiver who is with her 40 hours/week.  The past 2 to 3 days she has had decreased appetite.  This morning she was found on the floor and had an episode of emesis.  He believes she was on the floor for about 45 to 60 minutes.  He states that she should ambulate with a walker but typically uses a cane.  No history of dementia.  She is vaccinated for COVID and boosted.     Past Medical History:  Diagnosis Date   Candida esophagitis (HCC)    Chronic combined systolic (congestive) and diastolic (congestive) heart failure ([place])    a. 08/2016: echo showing EF of 45-50%, diffuse HK, Grade 1 DD, and mild MR   CKD (chronic kidney disease), stage III ([employer]) 03/09/2017   Heart disease    Hyperlipemia    Hypertension    Iron deficiency anemia    Pneumonia    Thyroid activity decreased    Thyroid disease     Patient Active Problem List   Diagnosis Date Noted   CKD (chronic kidney disease), stage III ([place]) 03/09/2017   Elevated troponin 03/09/2017   Anxiety 03/09/2017   GERD (gastroesophageal reflux disease) 03/09/2017   Underweight 03/09/2017   Aortic atherosclerosis ([place]) 03/09/2017   Hyperglycemia, drug-induced 03/09/2017   Lobar pneumonia ([place]) 03/05/2017   Acute respiratory failure with hypoxemia ([place]) 03/05/2017    Aspiration pneumonia ([place]) 09/23/2016   Acute on chronic combined systolic and diastolic CHF (congestive heart failure) ([place]) 09/23/2016   Iron deficiency anemia    Generalized weakness 06/12/2016   Mild dehydration 06/12/2016   Hypothyroidism 06/12/2016   Essential hypertension 06/12/2016   Hyperlipidemia 06/12/2016    Past Surgical History:  Procedure Laterality Date   COLONOSCOPY N/A 06/14/2016   Procedure: COLONOSCOPY;  Surgeon: [name], MD;  [facility];  Service: Endoscopy;  Laterality: N/A;   ESOPHAGOGASTRODUODENOSCOPY N/A 06/14/2016   Procedure: ESOPHAGOGASTRODUODENOSCOPY (EGD);  Surgeon: [name], MD;  [facility];  Service: Endoscopy;  Laterality: N/A;  WITH DILATION    TONSILLECTOMY     [place] SINUS       OB History     Gravida      Para      Term      Preterm      AB      Living  2      SAB      IAB      Ectopic      Multiple      Live Births              Family History  Problem Relation Age of Onset   Asthma Mother  Congestive Heart Failure Mother    Emphysema Father    Heart Problems Brother    Colon cancer Neg Hx     Social History   Tobacco Use   Smoking status: Never   Smokeless tobacco: Never  Vaping Use   Vaping Use: Never used  Substance Use Topics   Alcohol use: No   Drug use: No    Home Medications Prior to Admission medications   Medication Sig Start Date End Date Taking? Authorizing Provider  acetaminophen (TYLENOL) 500 MG tablet Take 500 mg by mouth every 6 (six) hours as needed for mild pain.   Yes [provider]  Calcium Citrate-Vitamin D (CALCIUM CITRATE + D PO) Take 1 tablet by mouth daily.   Yes [provider]  cetirizine (ZYRTEC) 10 MG tablet Take 10 mg by mouth daily. 09/09/20  Yes [provider]  cholecalciferol (VITAMIN D) 1000 units tablet Take 1,000 Units by mouth daily.   Yes [provider]  furosemide (LASIX) 40 MG tablet Take 40  mg by mouth daily.   Yes [provider]  gabapentin (NEURONTIN) 300 MG capsule Take 300 mg by mouth at bedtime. 06/06/16  Yes [provider]  levothyroxine (SYNTHROID, LEVOTHROID) 50 MCG tablet Take 50 mcg by mouth daily. 06/02/16  Yes [provider]  Multiple Vitamins-Minerals (PRESERVISION AREDS PO) Take 1 capsule by mouth 2 (two) times daily.    Yes [provider]  Omega-3 Fatty Acids (FISH OIL) 1000 MG CAPS Take 1,000 mg by mouth daily.   Yes [provider]  polyethylene glycol powder (GLYCOLAX/MIRALAX) 17 GM/SCOOP powder Take 0.5 Containers by mouth daily. 11/15/20  Yes [provider]  potassium chloride SA (K-DUR,KLOR-CON) 20 MEQ tablet Take 20 mEq by mouth daily.   Yes [provider]  pravastatin (PRAVACHOL) 40 MG tablet Take 40 mg by mouth daily. 03/16/16  Yes [provider]  torsemide (DEMADEX) 20 MG tablet Take 20 mg by mouth 2 (two) times daily. 10/26/20  Yes [provider]  traZODone (DESYREL) 50 MG tablet Take 1 tablet (50 mg total) by mouth at bedtime as needed for sleep. 06/15/16  Yes Lucia Gaskins, MD  albuterol (PROVENTIL) (2.5 MG/3ML) 0.083% nebulizer solution Take 2.5 mg by nebulization 2 (two) times daily. Patient not taking: Reported on 11/27/2020 09/27/20   [provider]  bisoprolol (ZEBETA) 5 MG tablet Take 1 tablet (5 mg total) by mouth daily. 10/04/16   Lucia Gaskins, MD  famciclovir (FAMVIR) 500 MG tablet Take 500 mg by mouth 3 (three) times daily. 10/27/20   [provider]  ipratropium-albuterol (DUONEB) 0.5-2.5 (3) MG/3ML SOLN Take 3 mLs by nebulization every 6 (six) hours. Patient not taking: Reported on 11/27/2020 03/09/17   Rama, Venetia Maxon, MD  oxyCODONE (OXY IR/ROXICODONE) 5 MG immediate release tablet Take 5 mg by mouth daily. Patient not taking: Reported on 11/27/2020 10/27/20   [provider]  pantoprazole (PROTONIX) 40 MG tablet Take 40 mg by mouth  daily.    [provider]  promethazine (PHENERGAN) 25 MG tablet Take 25 mg by mouth daily. Patient not taking: Reported on 11/27/2020 11/18/20   [provider]  trimethoprim-polymyxin b (POLYTRIM) ophthalmic solution SMARTSIG:In Eye(s) Patient not taking: Reported on 11/27/2020 08/18/20   [provider]    Allergies    Patient has no known allergies.  Review of Systems   Review of Systems  All other systems reviewed and are negative. Ten systems reviewed and are negative for  acute change, except as noted in the HPI.   Physical Exam Updated Vital Signs BP (!) 150/75   Pulse 97   Temp 98.4 F (36.9 C) (Oral)   Resp 20   Ht 5' 7.5" (1.715 m)   Wt 59 kg   SpO2 93%   BMI 20.06 kg/m   Physical Exam Vitals and nursing note reviewed.  Constitutional:      General: She is not in acute distress.    Appearance: Normal appearance. She is not ill-appearing, toxic-appearing or diaphoretic.     Comments: Well-developed elderly female.  Covered in dried vomitus.  HENT:     Head: Normocephalic and atraumatic.     Right Ear: External ear normal.     Left Ear: External ear normal.     Nose: Nose normal.     Mouth/Throat:     Mouth: Mucous membranes are moist.     Pharynx: Oropharynx is clear. No oropharyngeal exudate or posterior oropharyngeal erythema.  Eyes:     General: No scleral icterus.       Right eye: No discharge.        Left eye: No discharge.     Extraocular Movements: Extraocular movements intact.     Conjunctiva/sclera: Conjunctivae normal.  Neck:     Comments: No midline C, T, or L-spine tenderness.  No signs of trauma to the neck or back. Cardiovascular:     Rate and Rhythm: Regular rhythm. Tachycardia present.     Pulses: Normal pulses.     Heart sounds: Normal heart sounds. No murmur heard.   No friction rub. No gallop.  Pulmonary:     Effort: Pulmonary effort is normal. No respiratory distress.     Breath sounds: Normal breath sounds.  No stridor. No wheezing, rhonchi or rales.     Comments: Lungs are clear to auscultation bilaterally.  No chest wall tenderness.  No signs of trauma. Chest:     Chest wall: No tenderness.  Abdominal:     General: Abdomen is flat.     Palpations: Abdomen is soft.     Tenderness: There is no abdominal tenderness.     Comments: Abdomen is flat, soft, and nontender.  Musculoskeletal:        General: Normal range of motion.     Cervical back: Normal range of motion and neck supple. No tenderness.  Skin:    General: Skin is warm and dry.  Neurological:     General: No focal deficit present.     Mental Status: She is alert and oriented to person, place, and time.     Comments: A&O x3.  Moving all 4 extremities when asked.  No gross deficits.  Able to elevate legs off bed and hold for 5 seconds.  Psychiatric:        Mood and Affect: Mood normal.        Behavior: Behavior normal.    ED Results / Procedures / Treatments   Labs (all labs ordered are listed, but only abnormal results are displayed) Labs Reviewed  COMPREHENSIVE METABOLIC PANEL - Abnormal; Notable for the following components:      Result Value   Potassium 3.2 (*)    Glucose, Bld 195 (*)    Creatinine, Ser 1.17 (*)    Total Protein 6.1 (*)    Alkaline Phosphatase 141 (*)    GFR, Estimated 43 (*)    All other components within normal limits  CBC WITH DIFFERENTIAL/PLATELET - Abnormal; Notable for the following  components:   WBC 12.0 (*)    MCV 100.3 (*)    Neutro Abs 10.7 (*)    Lymphs Abs 0.3 (*)    All other components within normal limits  URINALYSIS, ROUTINE W REFLEX MICROSCOPIC - Abnormal; Notable for the following components:   Ketones, ur 5 (*)    All other components within normal limits  RESP PANEL BY RT-PCR (FLU A&B, COVID) ARPGX2  URINE CULTURE  LIPASE, BLOOD  LACTIC ACID, PLASMA  LACTIC ACID, PLASMA  PROTIME-INR  APTT   EKG EKG Interpretation  Date/Time:  Saturday November 27 2020 17:54:32  EDT Ventricular Rate:  80 PR Interval:  206 QRS Duration: 118 QT Interval:  409 QTC Calculation: 472 R Axis:   -60 Text Interpretation: Sinus rhythm Left anterior fascicular block Consider anterior infarct No acute changes No significant change since last tracing Confirmed by Varney Biles [id number]) on 11/27/2020 6:43:40 PM  Radiology No results found.  Procedures Procedures   Medications Ordered in ED Medications  ondansetron (ZOFRAN) injection 4 mg (4 mg Intravenous Given 11/27/20 1918)  acetaminophen (TYLENOL) tablet 650 mg (650 mg Oral Given 11/27/20 1914)  lactated ringers bolus 1,000 mL (0 mLs Intravenous Stopped 11/27/20 2019)  cefTRIAXone (ROCEPHIN) 1 g in sodium chloride 0.9 % 100 mL IVPB (0 g Intravenous Stopped 11/27/20 2103)  potassium chloride (KLOR-CON) packet 40 mEq (40 mEq Oral Given 11/27/20 2130)  ondansetron (ZOFRAN) injection 4 mg (4 mg Intravenous Given 11/27/20 2147)   ED Course  I have reviewed the triage vital signs and the nursing notes.  Pertinent labs & imaging results that were available during my care of the patient were reviewed by me and considered in my medical decision making (see chart for details).  Clinical Course as of 11/27/20 2304  Sat Nov 27, 2020  1936 WBC(!): 12.0 [LJ]  1936 NEUT#(!): 10.7 [LJ]  1936 Temp(!): 101.4 F (38.6 C) [LJ]  1936 Pulse Rate(!): 109 [LJ]    Clinical Course User Index [LJ] Rayna Sexton, PA-C   MDM Rules/Calculators/A&P                          Pt is a 87 y.o. female who presents to the emergency department due to fevers, nausea, vomiting, decreased appetite.  Labs: CBC with a leukocytosis of 12, MCV of 100.3, neutrophils of 10.7, lymphocytes of 0.3. CMP with a potassium of 3.2, glucose of 195, creatinine of 1.17, total protein of 6.1, alk phos of 141, GFR 43. aPTT within normal limits at 31. PT/INR within normal limits. Lipase within normal limits at 30. Lactic acid within normal limits of  1.5. Respiratory panel is negative. UA with 5 ketones. Urine culture sent.  Imaging: Chest x-ray is negative. CT scan of the abdomen and pelvis without contrast is pending.   I, Rayna Sexton, PA-C, personally reviewed and evaluated these images and lab results as part of my medical decision-making.  Patient presented covered in dried emesis, febrile, as well as tachycardic.  A&O x3 and moving all 4 extremities with ease.  No signs of trauma.  UA is negative.  Respiratory panel is negative.  Patient given Zofran, IV fluids, Rocephin, as well as Klor-Con.  Unsure of the source of her fever.  CT scan of the abdomen/pelvis is pending.   Given patient's age as well as comorbidities feel she will require admission for observation as well as further work-up. This was discussed with the medicine team and they are agreeable.  Note: Portions of this report may have been transcribed using voice recognition software. Every effort was made to ensure accuracy; however, inadvertent computerized transcription errors may be present.   Final Clinical Impression(s) / ED Diagnoses Final diagnoses:  Fever, unspecified fever cause  Nausea and vomiting, intractability of vomiting not specified, unspecified vomiting type   Rx / DC Orders ED Discharge Orders     None        Rayna Sexton, PA-C 11/27/20 2306    Varney Biles, MD 11/28/20 [phone]

## 2020-11-28 ENCOUNTER — Observation Stay (HOSPITAL_COMMUNITY): Payer: Medicare Other

## 2020-11-28 DIAGNOSIS — I5042 Chronic combined systolic (congestive) and diastolic (congestive) heart failure: Secondary | ICD-10-CM | POA: Diagnosis present

## 2020-11-28 DIAGNOSIS — R6510 Systemic inflammatory response syndrome (SIRS) of non-infectious origin without acute organ dysfunction: Secondary | ICD-10-CM | POA: Diagnosis present

## 2020-11-28 DIAGNOSIS — N1832 Chronic kidney disease, stage 3b: Secondary | ICD-10-CM | POA: Diagnosis present

## 2020-11-28 DIAGNOSIS — R748 Abnormal levels of other serum enzymes: Secondary | ICD-10-CM | POA: Diagnosis present

## 2020-11-28 DIAGNOSIS — R531 Weakness: Secondary | ICD-10-CM | POA: Diagnosis present

## 2020-11-28 DIAGNOSIS — Z515 Encounter for palliative care: Secondary | ICD-10-CM | POA: Diagnosis not present

## 2020-11-28 DIAGNOSIS — E039 Hypothyroidism, unspecified: Secondary | ICD-10-CM

## 2020-11-28 DIAGNOSIS — C250 Malignant neoplasm of head of pancreas: Secondary | ICD-10-CM | POA: Diagnosis present

## 2020-11-28 DIAGNOSIS — K828 Other specified diseases of gallbladder: Secondary | ICD-10-CM | POA: Diagnosis present

## 2020-11-28 DIAGNOSIS — B0229 Other postherpetic nervous system involvement: Secondary | ICD-10-CM | POA: Diagnosis present

## 2020-11-28 DIAGNOSIS — I700 Atherosclerosis of aorta: Secondary | ICD-10-CM | POA: Diagnosis present

## 2020-11-28 DIAGNOSIS — I130 Hypertensive heart and chronic kidney disease with heart failure and stage 1 through stage 4 chronic kidney disease, or unspecified chronic kidney disease: Secondary | ICD-10-CM | POA: Diagnosis present

## 2020-11-28 DIAGNOSIS — K831 Obstruction of bile duct: Secondary | ICD-10-CM | POA: Diagnosis present

## 2020-11-28 DIAGNOSIS — R112 Nausea with vomiting, unspecified: Secondary | ICD-10-CM

## 2020-11-28 DIAGNOSIS — R109 Unspecified abdominal pain: Secondary | ICD-10-CM | POA: Diagnosis present

## 2020-11-28 DIAGNOSIS — G8929 Other chronic pain: Secondary | ICD-10-CM | POA: Diagnosis present

## 2020-11-28 DIAGNOSIS — K838 Other specified diseases of biliary tract: Secondary | ICD-10-CM

## 2020-11-28 DIAGNOSIS — Z7189 Other specified counseling: Secondary | ICD-10-CM | POA: Diagnosis not present

## 2020-11-28 DIAGNOSIS — E876 Hypokalemia: Secondary | ICD-10-CM | POA: Diagnosis present

## 2020-11-28 DIAGNOSIS — M549 Dorsalgia, unspecified: Secondary | ICD-10-CM | POA: Diagnosis present

## 2020-11-28 DIAGNOSIS — K315 Obstruction of duodenum: Secondary | ICD-10-CM | POA: Diagnosis present

## 2020-11-28 DIAGNOSIS — K8689 Other specified diseases of pancreas: Secondary | ICD-10-CM | POA: Diagnosis not present

## 2020-11-28 DIAGNOSIS — R509 Fever, unspecified: Secondary | ICD-10-CM

## 2020-11-28 DIAGNOSIS — C786 Secondary malignant neoplasm of retroperitoneum and peritoneum: Secondary | ICD-10-CM | POA: Diagnosis present

## 2020-11-28 DIAGNOSIS — D509 Iron deficiency anemia, unspecified: Secondary | ICD-10-CM | POA: Diagnosis present

## 2020-11-28 DIAGNOSIS — Z66 Do not resuscitate: Secondary | ICD-10-CM | POA: Diagnosis present

## 2020-11-28 DIAGNOSIS — E785 Hyperlipidemia, unspecified: Secondary | ICD-10-CM | POA: Diagnosis present

## 2020-11-28 DIAGNOSIS — H3530 Unspecified macular degeneration: Secondary | ICD-10-CM | POA: Diagnosis present

## 2020-11-28 DIAGNOSIS — K219 Gastro-esophageal reflux disease without esophagitis: Secondary | ICD-10-CM | POA: Diagnosis present

## 2020-11-28 DIAGNOSIS — K2990 Gastroduodenitis, unspecified, without bleeding: Secondary | ICD-10-CM | POA: Diagnosis present

## 2020-11-28 DIAGNOSIS — Z20822 Contact with and (suspected) exposure to covid-19: Secondary | ICD-10-CM | POA: Diagnosis present

## 2020-11-28 LAB — COMPREHENSIVE METABOLIC PANEL
ALT: 33 U/L (ref 0–44)
AST: 39 U/L (ref 15–41)
Albumin: 2.9 g/dL — ABNORMAL LOW (ref 3.5–5.0)
Alkaline Phosphatase: 112 U/L (ref 38–126)
Anion gap: 7 (ref 5–15)
BUN: 19 mg/dL (ref 8–23)
CO2: 27 mmol/L (ref 22–32)
Calcium: 8.5 mg/dL — ABNORMAL LOW (ref 8.9–10.3)
Chloride: 102 mmol/L (ref 98–111)
Creatinine, Ser: 1.18 mg/dL — ABNORMAL HIGH (ref 0.44–1.00)
GFR, Estimated: 43 mL/min — ABNORMAL LOW (ref >60)
Glucose, Bld: 135 mg/dL — ABNORMAL HIGH (ref 70–99)
Potassium: 4 mmol/L (ref 3.5–5.1)
Sodium: 136 mmol/L (ref 135–145)
Total Bilirubin: 0.6 mg/dL (ref 0.3–1.2)
Total Protein: 5.4 g/dL — ABNORMAL LOW (ref 6.5–8.1)

## 2020-11-28 LAB — CBC
HCT: 34.4 % — ABNORMAL LOW (ref 36.0–46.0)
Hemoglobin: 11.6 g/dL — ABNORMAL LOW (ref 12.0–15.0)
MCH: 34.5 pg — ABNORMAL HIGH (ref 26.0–34.0)
MCHC: 33.7 g/dL (ref 30.0–36.0)
MCV: 102.4 fL — ABNORMAL HIGH (ref 80.0–100.0)
Platelets: 242 10*3/uL (ref 150–400)
RBC: 3.36 MIL/uL — ABNORMAL LOW (ref 3.87–5.11)
RDW: 14.6 % (ref 11.5–15.5)
WBC: 10.7 10*3/uL — ABNORMAL HIGH (ref 4.0–10.5)
nRBC: 0 % (ref 0.0–0.2)

## 2020-11-28 LAB — TSH: TSH: 1.776 u[IU]/mL (ref 0.350–4.500)

## 2020-11-28 MED ORDER — ONDANSETRON HCL 4 MG PO TABS: 4.0000 mg | ORAL_TABLET | Freq: Four times a day (QID) | ORAL | Status: DC | PRN

## 2020-11-28 MED ORDER — HEPARIN SODIUM (PORCINE) 5000 UNIT/ML IJ SOLN
5000.0000 [IU] | Freq: Three times a day (TID) | INTRAMUSCULAR | Status: DC
  Administered 2020-11-28 – 2020-11-29 (×4): 5000 [IU] via SUBCUTANEOUS
  Filled 2020-11-28 (×4): qty 1

## 2020-11-28 MED ORDER — TRAZODONE HCL 50 MG PO TABS
50.0000 mg | ORAL_TABLET | Freq: Every evening | ORAL | Status: DC | PRN
  Administered 2020-11-28 – 2020-11-30 (×4): 50 mg via ORAL
  Filled 2020-11-28 (×4): qty 1

## 2020-11-28 MED ORDER — PIPERACILLIN-TAZOBACTAM 3.375 G IVPB
3.3750 g | Freq: Three times a day (TID) | INTRAVENOUS | Status: DC
  Administered 2020-11-28 – 2020-12-01 (×10): 3.375 g via INTRAVENOUS
  Filled 2020-11-28 (×10): qty 50

## 2020-11-28 MED ORDER — LEVOTHYROXINE SODIUM 50 MCG PO TABS
50.0000 ug | ORAL_TABLET | Freq: Every day | ORAL | Status: DC
  Administered 2020-11-28 – 2020-12-01 (×3): 50 ug via ORAL
  Filled 2020-11-28 (×3): qty 1

## 2020-11-28 MED ORDER — ACETAMINOPHEN 325 MG PO TABS
650.0000 mg | ORAL_TABLET | Freq: Four times a day (QID) | ORAL | Status: DC | PRN
  Administered 2020-11-29 – 2020-12-01 (×4): 650 mg via ORAL
  Filled 2020-11-28 (×5): qty 2

## 2020-11-28 MED ORDER — ACETAMINOPHEN 650 MG RE SUPP: 650.0000 mg | Freq: Four times a day (QID) | RECTAL | Status: DC | PRN

## 2020-11-28 MED ORDER — GABAPENTIN 300 MG PO CAPS
300.0000 mg | ORAL_CAPSULE | Freq: Every day | ORAL | Status: DC
  Administered 2020-11-28 – 2020-11-30 (×4): 300 mg via ORAL
  Filled 2020-11-28 (×4): qty 1

## 2020-11-28 MED ORDER — ONDANSETRON HCL 4 MG/2ML IJ SOLN: 4.0000 mg | Freq: Four times a day (QID) | INTRAMUSCULAR | Status: DC | PRN

## 2020-11-28 MED ORDER — PANTOPRAZOLE SODIUM 40 MG PO TBEC
40.0000 mg | DELAYED_RELEASE_TABLET | Freq: Every day | ORAL | Status: DC
  Administered 2020-11-28 – 2020-12-01 (×3): 40 mg via ORAL
  Filled 2020-11-28 (×3): qty 1

## 2020-11-28 MED ORDER — BISOPROLOL FUMARATE 5 MG PO TABS
5.0000 mg | ORAL_TABLET | Freq: Every day | ORAL | Status: DC
  Administered 2020-11-28 – 2020-12-01 (×3): 5 mg via ORAL
  Filled 2020-11-28 (×3): qty 1

## 2020-11-28 MED ORDER — PRAVASTATIN SODIUM 40 MG PO TABS
40.0000 mg | ORAL_TABLET | Freq: Every day | ORAL | Status: DC
  Administered 2020-11-28 – 2020-12-01 (×3): 40 mg via ORAL
  Filled 2020-11-28 (×3): qty 1

## 2020-11-28 MED ORDER — OXYCODONE HCL 5 MG PO TABS
5.0000 mg | ORAL_TABLET | ORAL | Status: DC | PRN
Start: 2020-11-28 — End: 2020-12-01

## 2020-11-28 NOTE — Consult Note (Signed)
Referring Provider: No ref. provider found Primary Care Physician:  Lucia Gaskins, MD Primary Gastroenterologist:  Dr. Gala Romney  Reason for Consultation: Nausea, vomiting, abnormal imaging  HPI: Very pleasant 87 year old lady to the emergency department yesterday with progressive anorexia nausea, weakness and a 15 pound weight loss over the past 2 weeks.  Noted upper abdominal "pressure" over the past couple of days. In the ED, febrile with a temp of 102.8, white count minimally elevated at 12,000. LFTs normal except for alkaline phosphatase of 141 (repeat LFTs are entirely normal this morning). Noncontrast CT of the abdomen demonstrated dilation of the intra and extra hepatic biliary tree with asymmetrical appearance of the pancreatic head and nodularity of the peritoneum concerning for metastatic disease. Right upper quadrant ultrasound this morning confirmed biliary dilation.  Dilated gallbladder but no stones.  She is awaiting a bed upstairs.  She was started on Zosyn. Prior GI evaluation for iron deficiency anemia 2018 demonstrated esophageal stenosis and Candida esophagitis (esophagus dilated at that time) non-H. pylori gastritis.  Biopsies negative for celiac disease.  Colonoscopy revealed only a redundant colon. Prior to the past couple of weeks patient was doing well essentially living at home with a caregiver present about 4-1/2 hours daily.  Denies any issues with the bowels or Appetite;  no abdominal pain or other GI symptoms.   Past Medical History:  Diagnosis Date   Candida esophagitis (HCC)    Chronic combined systolic (congestive) and diastolic (congestive) heart failure ([place])    a. 08/2016: echo showing EF of 45-50%, diffuse HK, Grade 1 DD, and mild MR   CKD (chronic kidney disease), stage III ([place]) 03/09/2017   Heart disease    Hyperlipemia    Hypertension    Iron deficiency anemia    Pneumonia    Thyroid activity decreased    Thyroid disease     Past Surgical  History:  Procedure Laterality Date   COLONOSCOPY N/A 06/14/2016   Procedure: COLONOSCOPY;  Surgeon: [name], MD;  Location: [facility];  Service: Endoscopy;  Laterality: N/A;   ESOPHAGOGASTRODUODENOSCOPY N/A 06/14/2016   Procedure: ESOPHAGOGASTRODUODENOSCOPY (EGD);  Surgeon: [name], MD;  Location: [facility];  Service: Endoscopy;  Laterality: N/A;  WITH DILATION    TONSILLECTOMY     [place] SINUS      Prior to Admission medications   Medication Sig Start Date End Date Taking? Authorizing Provider  acetaminophen (TYLENOL) 500 MG tablet Take 500 mg by mouth every 6 (six) hours as needed for mild pain.   Yes [provider]  bisoprolol (ZEBETA) 5 MG tablet Take 1 tablet (5 mg total) by mouth daily. 10/04/16  Yes Lucia Gaskins, MD  Calcium Citrate-Vitamin D (CALCIUM CITRATE + D PO) Take 1 tablet by mouth daily.   Yes [provider]  cetirizine (ZYRTEC) 10 MG tablet Take 10 mg by mouth daily. 09/09/20  Yes [provider]  cholecalciferol (VITAMIN D) 1000 units tablet Take 1,000 Units by mouth daily.   Yes [provider]  furosemide (LASIX) 40 MG tablet Take 40 mg by mouth daily.   Yes [provider]  gabapentin (NEURONTIN) 300 MG capsule Take 300 mg by mouth at bedtime. 06/06/16  Yes [provider]  levothyroxine (SYNTHROID, LEVOTHROID) 50 MCG tablet Take 50 mcg by mouth daily. 06/02/16  Yes [provider]  Multiple Vitamins-Minerals (PRESERVISION AREDS PO) Take 1 capsule by mouth 2 (two) times daily.    Yes [provider]  Omega-3  Fatty Acids (FISH OIL) 1000 MG CAPS Take 1,000 mg by mouth daily.   Yes [provider]  pantoprazole (PROTONIX) 40 MG tablet Take 40 mg by mouth daily.   Yes [provider]  polyethylene glycol powder (GLYCOLAX/MIRALAX) 17 GM/SCOOP powder Take 0.5 Containers by mouth daily. 11/15/20  Yes [provider]  potassium chloride SA (K-DUR,KLOR-CON) 20 MEQ  tablet Take 20 mEq by mouth daily.   Yes [provider]  pravastatin (PRAVACHOL) 40 MG tablet Take 40 mg by mouth daily. 03/16/16  Yes [provider]  torsemide (DEMADEX) 20 MG tablet Take 20 mg by mouth 2 (two) times daily. 10/26/20  Yes [provider]  traZODone (DESYREL) 50 MG tablet Take 1 tablet (50 mg total) by mouth at bedtime as needed for sleep. 06/15/16  Yes Lucia Gaskins, MD    Current Facility-Administered Medications  Medication Dose Route Frequency Provider Last Rate Last Admin   acetaminophen (TYLENOL) tablet 650 mg  650 mg Oral Q6H PRN Zierle-Ghosh, Asia B, DO       Or   acetaminophen (TYLENOL) suppository 650 mg  650 mg Rectal Q6H PRN Zierle-Ghosh, Asia B, DO       bisoprolol (ZEBETA) tablet 5 mg  5 mg Oral Daily Zierle-Ghosh, Asia B, DO   5 mg at 11/28/20 1104   gabapentin (NEURONTIN) capsule 300 mg  300 mg Oral QHS Zierle-Ghosh, Asia B, DO   300 mg at 11/28/20 0108   heparin injection 5,000 Units  5,000 Units Subcutaneous Q8H Zierle-Ghosh, Asia B, DO   5,000 Units at 11/28/20 0614   levothyroxine (SYNTHROID) tablet 50 mcg  50 mcg Oral Daily Zierle-Ghosh, Asia B, DO   50 mcg at 11/28/20 0613   ondansetron (ZOFRAN) tablet 4 mg  4 mg Oral Q6H PRN Zierle-Ghosh, Asia B, DO       Or   ondansetron (ZOFRAN) injection 4 mg  4 mg Intravenous Q6H PRN Zierle-Ghosh, Asia B, DO       oxyCODONE (Oxy IR/ROXICODONE) immediate release tablet 5 mg  5 mg Oral Q4H PRN Zierle-Ghosh, Asia B, DO       pantoprazole (PROTONIX) EC tablet 40 mg  40 mg Oral Daily Zierle-Ghosh, Asia B, DO   40 mg at 11/28/20 1104   piperacillin-tazobactam (ZOSYN) IVPB 3.375 g  3.375 g Intravenous Q8H Zierle-Ghosh, Asia B, DO   Stopped at 11/28/20 0659   pravastatin (PRAVACHOL) tablet 40 mg  40 mg Oral Daily Zierle-Ghosh, Asia B, DO   40 mg at 11/28/20 1104   traZODone (DESYREL) tablet 50 mg  50 mg Oral QHS PRN Zierle-Ghosh, Asia B, DO   50 mg at 11/28/20 0107   Current Outpatient  Medications  Medication Sig Dispense Refill   acetaminophen (TYLENOL) 500 MG tablet Take 500 mg by mouth every 6 (six) hours as needed for mild pain.     bisoprolol (ZEBETA) 5 MG tablet Take 1 tablet (5 mg total) by mouth daily. 30 tablet 2   Calcium Citrate-Vitamin D (CALCIUM CITRATE + D PO) Take 1 tablet by mouth daily.     cetirizine (ZYRTEC) 10 MG tablet Take 10 mg by mouth daily.     cholecalciferol (VITAMIN D) 1000 units tablet Take 1,000 Units by mouth daily.     furosemide (LASIX) 40 MG tablet Take 40 mg by mouth daily.     gabapentin (NEURONTIN) 300 MG capsule Take 300 mg by mouth at bedtime.     levothyroxine (SYNTHROID, LEVOTHROID) 50 MCG tablet Take 50  mcg by mouth daily.     Multiple Vitamins-Minerals (PRESERVISION AREDS PO) Take 1 capsule by mouth 2 (two) times daily.      Omega-3 Fatty Acids (FISH OIL) 1000 MG CAPS Take 1,000 mg by mouth daily.     pantoprazole (PROTONIX) 40 MG tablet Take 40 mg by mouth daily.     polyethylene glycol powder (GLYCOLAX/MIRALAX) 17 GM/SCOOP powder Take 0.5 Containers by mouth daily.     potassium chloride SA (K-DUR,KLOR-CON) 20 MEQ tablet Take 20 mEq by mouth daily.     pravastatin (PRAVACHOL) 40 MG tablet Take 40 mg by mouth daily.  3   torsemide (DEMADEX) 20 MG tablet Take 20 mg by mouth 2 (two) times daily.     traZODone (DESYREL) 50 MG tablet Take 1 tablet (50 mg total) by mouth at bedtime as needed for sleep. 30 tablet 3    Allergies as of 11/27/2020   (No Known Allergies)    Family History  Problem Relation Age of Onset   Asthma Mother    Congestive Heart Failure Mother    Emphysema Father    Heart Problems Brother    Colon cancer Neg Hx     Social History   Socioeconomic History   Marital status: Married    Spouse name: Not on file   Number of children: Not on file   Years of education: Not on file   Highest education level: Not on file  Occupational History   Not on file  Tobacco Use   Smoking status: Never    Smokeless tobacco: Never  Vaping Use   Vaping Use: Never used  Substance and Sexual Activity   Alcohol use: No   Drug use: No   Sexual activity: Not Currently  Other Topics Concern   Not on file  Social History Narrative   Not on file   Social Determinants of Health   Financial Resource Strain: Not on file  Food Insecurity: Not on file  Transportation Needs: Not on file  Physical Activity: Not on file  Stress: Not on file  Social Connections: Not on file  Intimate Partner Violence: Not on file    Review of Systems:  As in history of present illness.  Physical Exam: Vital signs in last 24 hours: Temp:  [98.4 F (36.9 C)-102.8 F (39.3 C)] 98.4 F (36.9 C) (06/25 2020) Pulse Rate:  [46-109] 51 (06/26 0955) Resp:  [14-28] 19 (06/26 0955) BP: (117-199)/(55-88) 134/67 (06/26 0955) SpO2:  [92 %-98 %] 98 % (06/26 0955) Weight:  [59 kg] 59 kg (06/25 1748)   Patient seen and examined in [facility], ED room 10.  She is companied by her son Delane Ginger.  General:   Awake conversant pleasant and cooperative in NAD Eyes:  Sclera clear, no icterus.   Conjunctiva pink. Neck:  Supple; no masses or thyromegaly. Lungs:  Clear throughout to auscultation.   No wheezes, crackles, or rhonchi. No acute distress. Heart:  Regular rate and rhythm; no murmurs, clicks, rubs,  or gallops. Abdomen: Nondistended.  Positive bowel sounds she does have some mild tenderness right upper quadrant without appreciable mass organomegaly Rectal:  Deferred until time of colonoscopy.   Intake/Output from previous day: 06/25 0701 - 06/26 0700 In: 1150 [IV Piggyback:1150] Out: -  Intake/Output this shift: No intake/output data recorded.  Lab Results: Recent Labs    11/27/20 1905 11/28/20 0519  WBC 12.0* 10.7*  HGB 13.2 11.6*  HCT 39.2 34.4*  PLT 254 242   BMET  Recent Labs    11/27/20 1905 11/28/20 0519  NA 135 136  K 3.2* 4.0  CL 100 102  CO2 25 27  GLUCOSE 195* 135*  BUN 19 19   CREATININE 1.17* 1.18*  CALCIUM 8.9 8.5*   LFT Recent Labs    11/28/20 0519  PROT 5.4*  ALBUMIN 2.9*  AST 39  ALT 33  ALKPHOS 112  BILITOT 0.6   PT/INR Recent Labs    11/27/20 1905  LABPROT 14.4  INR 1.1   Hepatitis Panel No results for input(s): HEPBSAG, HCVAB, HEPAIGM, HEPBIGM in the last 72 hours. C-Diff No results for input(s): CDIFFTOX in the last 72 hours.  Studies/Results: CT ABDOMEN PELVIS WO CONTRAST  Result Date: 11/28/2020 CLINICAL DATA:  Acute, nonlocalized abdominal pain. Vomiting. Altered mental status EXAM: CT ABDOMEN AND PELVIS WITHOUT CONTRAST TECHNIQUE: Multidetector CT imaging of the abdomen and pelvis was performed following the standard protocol without IV contrast. COMPARISON:  None. FINDINGS: Lower chest: The visualized lung bases are clear bilaterally. Mild multi-vessel coronary artery calcification. Global cardiac size within normal limits. Hepatobiliary: There is mild intrahepatic and moderate extrahepatic biliary ductal dilation. There is probable periportal edema identified best appreciated within segment 2 and segment 7 of the liver peripherally. The gallbladder is distended and there is minimal infiltration of the pericholecystic fat which may be inflammatory, but is nonspecific given the presence of a trace perihepatic ascites. No definite focal intrahepatic mass identified. Pancreas: There is asymmetric thickening of the head of the pancreas, not well assessed on this noncontrast examination. An underlying pancreatic mass or focal inflammatory change could appear in this fashion. Spleen: Unremarkable Adrenals/Urinary Tract: The adrenal glands are unremarkable. The kidneys are normal in size and position. Simple endophytic cortical cyst noted within the interpolar region of the left kidney. Indeterminate 14 mm cystic lesion arises from the upper pole of the right kidney, not well assessed on this noncontrast examination. The kidneys are otherwise  unremarkable. The bladder is unremarkable peer Stomach/Bowel: There is omental nodularity and infiltration most in keeping with peritoneal carcinomatosis. Trace ascites noted. The stomach, small bowel, and large bowel are otherwise unremarkable. The appendix is not clearly identified on this examination. No free intraperitoneal gas. Vascular/Lymphatic: Moderate aortoiliac atherosclerotic calcification. No aortic aneurysm. No pathologic adenopathy within the abdomen and pelvis. Reproductive: Uterus and bilateral adnexa are unremarkable. Other: No abdominal wall hernia identified. Musculoskeletal: No acute bone abnormality. Osseous structures are age-appropriate. No lytic or blastic bone lesions identified. IMPRESSION: Technically limited examination in absence of contrast administration. Asymmetric thickening of the a head of the pancreas concerning for an underlying pancreatic malignancy given the associated peritoneal nodularity and infiltration suggestive of peritoneal carcinomatosis. This is not well assessed on this examination and dedicated pancreatic protocol CT or MRI examination recommended for further evaluation. Mild to moderate intra and extrahepatic biliary ductal dilation and distension of the gallbladder. Suggestion of a peripheral periportal edema. This may reflect obstruction related to the process within the head of the pancreas. An intrinsic stricture or inflammatory process, however, could appear similarly. Correlation with liver enzymes is recommended. Dedicated sonography may also be helpful to exclude a primary inflammatory process of the gallbladder. Indeterminate 14 mm cystic lesion within the upper pole the right kidney. This could be simultaneously assessed with MRI examination of the pancreas and liver. Aortic Atherosclerosis (ICD10-I70.0). Electronically Signed   By: Fidela Salisbury MD   On: 11/28/2020 00:19   DG Chest Portable 1 View  Result Date: 11/27/2020 CLINICAL DATA:  Status  post fall. EXAM: PORTABLE CHEST 1 VIEW COMPARISON:  May 11, 2017 FINDINGS: Chronic appearing increased lung markings are seen without evidence of acute infiltrate, pleural effusion or pneumothorax. Stable linear scarring is seen within the right lung base. The heart size and mediastinal contours are within normal limits. Marked severity calcification of the thoracic aorta is noted. Degenerative changes seen throughout the thoracic spine. IMPRESSION: Stable exam without acute or active cardiopulmonary disease. Electronically Signed   By: Virgina Norfolk M.D.   On: 11/27/2020 20:37   US Abdomen Limited RUQ (LIVER/GB)  Result Date: 11/28/2020 CLINICAL DATA:  Abnormal CT.  Follow-up exam. EXAM: ULTRASOUND ABDOMEN LIMITED RIGHT UPPER QUADRANT COMPARISON:  CT, 11/27/2020. FINDINGS: Gallbladder: Gallbladder is distended. No shadowing stones. No gallbladder wall thickening or pericholecystic fluid. Common bile duct: Diameter: Dilated to 1.1 cm, mid to distal portion. No visualized stone. Liver: Heterogeneous echogenicity. Normal size. No mass or focal lesion. Portal vein is patent on color Doppler imaging with normal direction of blood flow towards the liver. Other: None. IMPRESSION: 1. Dilated mid to distal common bile duct without evidence of a duct stone. CT findings suspicious for a pancreatic head mass. This would be best assessed with pancreatic MRI without and with contrast, alternatively pancreatic protocol CT with contrast. 2. Heterogeneous echogenicity of the liver without a defined mass. 3. Distended gallbladder, but no stones or evidence of acute cholecystitis. Electronically Signed   By: Lajean Manes M.D.   On: 11/28/2020 10:19    Impression: Pleasant 87 year old lady admitted to the hospital with a 3-week history of nausea/anorexia, weight loss; more recently with nonbloody emesis and upper abdominal discomfort. Evaluation in the ED revealed her to be febrile with a mild leukocytosis; dilation of  her intra and extrahepatic biliary tree on imaging with bothersome findings concerning for pancreatic cancer with metastasis. She has been placed on antibiotics and is being admitted to the hospital.  Her LFTs are entirely normal this morning.   I suspect her recent upper GI tract symptoms are a result of extrahepatic biliary obstruction in evolution from a tumor in the head of the pancreas.  Temp spike more likely due to bacteremia from evolving biliary obstruction without other source of fever. LFTs not yet elevated.  Recommendations:  Agree with IV fluids, IV antibiotics  I suspect patient will benefit from palliative biliary stent placement in the near future.  However, lets proceed with a MRI of the pancreas/MRCP to get more information prior to contemplating ERCP.  Repeat hepatic function profile in the a.m.  I have reviewed my findings at length with the patient and her son, Delane Ginger, at the bedside today.  Further recommendations to follow once the MRI is available for review.       Notice:  This dictation was prepared with Dragon dictation along with smaller phrase technology. Any transcriptional errors that result from this process are unintentional and may not be corrected upon review.

## 2020-11-28 NOTE — ED Notes (Signed)
Patient care giver updated at this time.

## 2020-11-28 NOTE — ED Notes (Signed)
Dr. Sydell Axon, GI, at bedside to speak with patient

## 2020-11-28 NOTE — Progress Notes (Signed)
PROGRESS NOTE   Katelyn Daniel  XKG:[id number] DOB: Aug 02, 1923 DOA: 11/27/2020 PCP: Lucia Gaskins, MD   Chief Complaint  Patient presents with   Fall    Fever    Level of care: Med-Surg  Brief Admission History:   87 y.o. female, with history of Combined CHF, CKD, HLD, HTN, thyroid disease and more presents to the ED with a chief complaint of nausea and vomiting. She reports that it started yesterday.She reports that was having episodes of non-bloody emesis q15 mins.  She reports associated generalized weakness, and today she slumped onto the floor and was unable to get up.  She reports that she did not fall she had been sitting on the couch and then she slumped down to the floor.  She reports a pressure like pain in her abdomen that she thinks was more concentrated in her lower abdomen.  She reports some diarrhea that she thinks was nonbloody.  She reports that she has macular degeneration so her vision is not good and she cannot be sure that there was no blood.  She had associated fever and chills.  She reports chronic abdominal pain and back pain from shingles.  3 days ago she did have chest pain directly under her life alert necklace.  She reports the pain was resolved with taking off the necklace.  She denies any dyspnea or palpitations.  She denies any dysuria.  She is oriented x3.  Patient has no other complaints at this time.  Assessment & Plan:   Active Problems:   Hypothyroidism   Intractable nausea and vomiting   SIRS (systemic inflammatory response syndrome) (HCC)   Hypokalemia   Abdominal pain  Suspected pancreatic cancer with metastasis - Pt has findings of biliary obstruction and GI consult requested and they are recommending palliative biliary stent placement pending results of MRI.  I had a long discussion with patient and she is aware that the prognosis is grave. She says she would not want chemotherapy or surgery and she is agreeable with palliative care. She is  deciding if she wants to go home with palliative services or to pursue residential hospice placement in [place].    Hypothyroidism - resume home levothyroxine daily.    Essential hypertension - resume home medications.   Intractable nausea and vomiting - she is improving with supportive care.    DNR present on admission - continue DNR order per wishes in hospital.    DVT prophylaxis: SQ heparin/SCD Code Status: DNR Family Communication: son Disposition: TBD Status is: Inpatient  Remains inpatient appropriate because:Ongoing active pain requiring inpatient pain management, IV treatments appropriate due to intensity of illness or inability to take PO, and Inpatient level of care appropriate due to severity of illness  Dispo: The patient is from: Home              Anticipated d/c is to:  TBD              Patient currently is not medically stable to d/c.   Difficult to place patient No   Consultants:  GI Palliative care   Procedures:  MRI pending   Antimicrobials:  Zosyn IV 6/26>>   Subjective: Pt reports that the pain is a little better controlled in the abdomen and the nausea is a little better.   Objective: Vitals:   11/28/20 1430 11/28/20 1500 11/28/20 1535 11/28/20 1615  BP: (!) 115/56 (!) 138/59 (!) 164/69 (!) 123/91  Pulse: (!) 54 (!) 49 (!) 53  Resp: 18 16 19    Temp:   97.8 F (36.6 C) 98.1 F (36.7 C)  TempSrc:   Oral Oral  SpO2: 95% 95% 100%   Weight:   60.6 kg   Height:   5\' 7"  (1.702 m)     Intake/Output Summary (Last 24 hours) at 11/28/2020 1643 Last data filed at 11/28/2020 1623 Gross per 24 hour  Intake 1179.89 ml  Output --  Net 1179.89 ml   Filed Weights   11/27/20 1748 11/28/20 1535  Weight: 59 kg 60.6 kg    Examination:  General exam: elderly pleasant female, awake, alert, Appears calm and comfortable  Respiratory system: Clear to auscultation. Respiratory effort normal. Cardiovascular system: normal S1 & S2 heard. No JVD, murmurs,  rubs, gallops or clicks. No pedal edema. Gastrointestinal system: Abdomen is nondistended, soft and mild epigastric tenderness. No organomegaly or masses felt. Normal bowel sounds heard. Central nervous system: Alert and oriented. No focal neurological deficits. Extremities: Symmetric 5 x 5 power. Skin: No rashes, lesions or ulcers Psychiatry: Judgement and insight appear normal. Mood & affect appropriate.   Data Reviewed: I have personally reviewed following labs and imaging studies  CBC: Recent Labs  Lab 11/27/20 1905 11/28/20 0519  WBC 12.0* 10.7*  NEUTROABS 10.7*  --   HGB 13.2 11.6*  HCT 39.2 34.4*  MCV 100.3* 102.4*  PLT 254 622    Basic Metabolic Panel: Recent Labs  Lab 11/27/20 1905 11/28/20 0519  NA 135 136  K 3.2* 4.0  CL 100 102  CO2 25 27  GLUCOSE 195* 135*  BUN 19 19  CREATININE 1.17* 1.18*  CALCIUM 8.9 8.5*    GFR: Estimated Creatinine Clearance: 27.3 mL/min (A) (by C-G formula based on SCr of 1.18 mg/dL (H)).  Liver Function Tests: Recent Labs  Lab 11/27/20 1905 11/28/20 0519  AST 41 39  ALT 34 33  ALKPHOS 141* 112  BILITOT 1.0 0.6  PROT 6.1* 5.4*  ALBUMIN 3.6 2.9*    CBG: No results for input(s): GLUCAP in the last 168 hours.  Recent Results (from the past 240 hour(s))  Resp Panel by RT-PCR (Flu A&B, Covid) Nasopharyngeal Swab     Status: None   Collection Time: 11/27/20  7:20 PM   Specimen: Nasopharyngeal Swab; Nasopharyngeal(NP) swabs in vial transport medium  Result Value Ref Range Status   SARS Coronavirus 2 by RT PCR NEGATIVE NEGATIVE Final    Comment: (NOTE) SARS-CoV-2 target nucleic acids are NOT DETECTED.  The SARS-CoV-2 RNA is generally detectable in upper respiratory specimens during the acute phase of infection. The lowest concentration of SARS-CoV-2 viral copies this assay can detect is 138 copies/mL. A negative result does not preclude SARS-Cov-2 infection and should not be used as the sole basis for treatment or other  patient management decisions. A negative result may occur with  improper specimen collection/handling, submission of specimen other than nasopharyngeal swab, presence of viral mutation(s) within the areas targeted by this assay, and inadequate number of viral copies(<138 copies/mL). A negative result must be combined with clinical observations, patient history, and epidemiological information. The expected result is Negative.  Fact Sheet for Patients:  EntrepreneurPulse.com.au  Fact Sheet for Healthcare Providers:  IncredibleEmployment.be  This test is no t yet approved or cleared by the [place] FDA and  has been authorized for detection and/or diagnosis of SARS-CoV-2 by FDA under an Emergency Use Authorization (EUA). This EUA will remain  in effect (meaning this test can be used) for the duration of  the COVID-19 declaration under Section 564(b)(1) of the Act, 21 U.S.C.section 360bbb-3(b)(1), unless the authorization is terminated  or revoked sooner.       Influenza A by PCR NEGATIVE NEGATIVE Final   Influenza B by PCR NEGATIVE NEGATIVE Final    Comment: (NOTE) The Xpert Xpress SARS-CoV-2/FLU/RSV plus assay is intended as an aid in the diagnosis of influenza from Nasopharyngeal swab specimens and should not be used as a sole basis for treatment. Nasal washings and aspirates are unacceptable for Xpert Xpress SARS-CoV-2/FLU/RSV testing.  Fact Sheet for Patients: EntrepreneurPulse.com.au  Fact Sheet for Healthcare Providers: IncredibleEmployment.be  This test is not yet approved or cleared by the [place] FDA and has been authorized for detection and/or diagnosis of SARS-CoV-2 by FDA under an Emergency Use Authorization (EUA). This EUA will remain in effect (meaning this test can be used) for the duration of the COVID-19 declaration under Section 564(b)(1) of the Act, 21 U.S.C. section  360bbb-3(b)(1), unless the authorization is terminated or revoked.  Performed at [facility], [address], [place], [place] [address]      Radiology Studies: CT ABDOMEN PELVIS WO CONTRAST  Result Date: 11/28/2020 CLINICAL DATA:  Acute, nonlocalized abdominal pain. Vomiting. Altered mental status EXAM: CT ABDOMEN AND PELVIS WITHOUT CONTRAST TECHNIQUE: Multidetector CT imaging of the abdomen and pelvis was performed following the standard protocol without IV contrast. COMPARISON:  None. FINDINGS: Lower chest: The visualized lung bases are clear bilaterally. Mild multi-vessel coronary artery calcification. Global cardiac size within normal limits. Hepatobiliary: There is mild intrahepatic and moderate extrahepatic biliary ductal dilation. There is probable periportal edema identified best appreciated within segment 2 and segment 7 of the liver peripherally. The gallbladder is distended and there is minimal infiltration of the pericholecystic fat which may be inflammatory, but is nonspecific given the presence of a trace perihepatic ascites. No definite focal intrahepatic mass identified. Pancreas: There is asymmetric thickening of the head of the pancreas, not well assessed on this noncontrast examination. An underlying pancreatic mass or focal inflammatory change could appear in this fashion. Spleen: Unremarkable Adrenals/Urinary Tract: The adrenal glands are unremarkable. The kidneys are normal in size and position. Simple endophytic cortical cyst noted within the interpolar region of the left kidney. Indeterminate 14 mm cystic lesion arises from the upper pole of the right kidney, not well assessed on this noncontrast examination. The kidneys are otherwise unremarkable. The bladder is unremarkable peer Stomach/Bowel: There is omental nodularity and infiltration most in keeping with peritoneal carcinomatosis. Trace ascites noted. The stomach, small bowel, and large bowel are otherwise unremarkable. The  appendix is not clearly identified on this examination. No free intraperitoneal gas. Vascular/Lymphatic: Moderate aortoiliac atherosclerotic calcification. No aortic aneurysm. No pathologic adenopathy within the abdomen and pelvis. Reproductive: Uterus and bilateral adnexa are unremarkable. Other: No abdominal wall hernia identified. Musculoskeletal: No acute bone abnormality. Osseous structures are age-appropriate. No lytic or blastic bone lesions identified. IMPRESSION: Technically limited examination in absence of contrast administration. Asymmetric thickening of the a head of the pancreas concerning for an underlying pancreatic malignancy given the associated peritoneal nodularity and infiltration suggestive of peritoneal carcinomatosis. This is not well assessed on this examination and dedicated pancreatic protocol CT or MRI examination recommended for further evaluation. Mild to moderate intra and extrahepatic biliary ductal dilation and distension of the gallbladder. Suggestion of a peripheral periportal edema. This may reflect obstruction related to the process within the head of the pancreas. An intrinsic stricture or inflammatory process, however, could appear similarly. Correlation  with liver enzymes is recommended. Dedicated sonography may also be helpful to exclude a primary inflammatory process of the gallbladder. Indeterminate 14 mm cystic lesion within the upper pole the right kidney. This could be simultaneously assessed with MRI examination of the pancreas and liver. Aortic Atherosclerosis (ICD10-I70.0). Electronically Signed   By: Fidela Salisbury MD   On: 11/28/2020 00:19   DG Chest Portable 1 View  Result Date: 11/27/2020 CLINICAL DATA:  Status post fall. EXAM: PORTABLE CHEST 1 VIEW COMPARISON:  May 11, 2017 FINDINGS: Chronic appearing increased lung markings are seen without evidence of acute infiltrate, pleural effusion or pneumothorax. Stable linear scarring is seen within the right  lung base. The heart size and mediastinal contours are within normal limits. Marked severity calcification of the thoracic aorta is noted. Degenerative changes seen throughout the thoracic spine. IMPRESSION: Stable exam without acute or active cardiopulmonary disease. Electronically Signed   By: [place]gina Norfolk M.D.   On: 11/27/2020 20:37   US Abdomen Limited RUQ (LIVER/GB)  Result Date: 11/28/2020 CLINICAL DATA:  Abnormal CT.  Follow-up exam. EXAM: ULTRASOUND ABDOMEN LIMITED RIGHT UPPER QUADRANT COMPARISON:  CT, 11/27/2020. FINDINGS: Gallbladder: Gallbladder is distended. No shadowing stones. No gallbladder wall thickening or pericholecystic fluid. Common bile duct: Diameter: Dilated to 1.1 cm, mid to distal portion. No visualized stone. Liver: Heterogeneous echogenicity. Normal size. No mass or focal lesion. Portal vein is patent on color Doppler imaging with normal direction of blood flow towards the liver. Other: None. IMPRESSION: 1. Dilated mid to distal common bile duct without evidence of a duct stone. CT findings suspicious for a pancreatic head mass. This would be best assessed with pancreatic MRI without and with contrast, alternatively pancreatic protocol CT with contrast. 2. Heterogeneous echogenicity of the liver without a defined mass. 3. Distended gallbladder, but no stones or evidence of acute cholecystitis. Electronically Signed   By: Lajean Manes M.D.   On: 11/28/2020 10:19    Scheduled Meds:  bisoprolol  5 mg Oral Daily   gabapentin  300 mg Oral QHS   heparin  5,000 Units Subcutaneous Q8H   levothyroxine  50 mcg Oral Daily   pantoprazole  40 mg Oral Daily   pravastatin  40 mg Oral Daily   Continuous Infusions:  piperacillin-tazobactam 3.375 g (11/28/20 1358)     LOS: 0 days   Time spent: 35 mins   Hlee Fringer Wynetta Emery, MD How to contact the [facility] Attending or Consulting provider [place] or covering provider during after hours [place], for this patient?  Check the care team in  [facility] and look for a) attending/consulting [facility] provider listed and b) the [facility] team listed Log into [email or web address] and use [place]'s universal password to access. If you do not have the password, please contact the hospital operator. Locate the [facility] provider you are looking for under [facility] and page to a number that you can be directly reached. If you still have difficulty reaching the provider, please page the [facility] (Director on Call) for the Hospitalists listed on amion for assistance.  11/28/2020, 4:43 PM  '

## 2020-11-28 NOTE — H&P (Signed)
                                                                            TRH H&P    Patient Demographics:    Katelyn Daniel, is a 87 y.o. female  MRN: [id number]  DOB - 08/18/1923  Admit Date - 11/27/2020  Referring MD/NP/PA: Logan  Outpatient Primary MD for the patient is Dondiego, Richard, MD  Patient coming from: Home  Chief complaint- Nausea and vomiting   HPI:    Katelyn Daniel  is a 87 y.o. female, with history of Combined CHF, CKD, HLD, HTN, thyroid disease and more presents to the ED with a chief complaint of nausea and vomiting. She reports that it started yesterday.She reports that was having episodes of non-bloody emesis q15 mins.  She reports associated generalized weakness, and today she slumped onto the floor and was unable to get up.  She reports that she did not fall she had been sitting on the couch and then she slumped down to the floor.  She reports a pressure like pain in her abdomen that she thinks was more concentrated in her lower abdomen.  She reports some diarrhea that she thinks was nonbloody.  She reports that she has macular degeneration so her vision is not good and she cannot be sure that there was no blood.  She had associated fever and chills.  She reports chronic abdominal pain and back pain from shingles.  3 days ago she did have chest pain directly under her life alert necklace.  She reports the pain was resolved with taking off the necklace.  She denies any dyspnea or palpitations.  She denies any dysuria.  She is oriented x3.  Patient has no other complaints at this time.  In the ED Patient was febrile to 102.8, she was tachycardic at 109, respiratory rate was 25, blood pressure 150/75 She is maintaining oxygen sats on room air with the lowest being 93% She had a leukocytosis of 12.0, and hemoglobin 13.2 She is a hypokalemia at 3.2 Elevated alk phos at 141 Lactic acid normal x2 Respiratory panel negative UA shows ketones Urine culture pending Chest  x-ray is a stable exam without acute or active cardiopulmonary disease EKG showed a heart rate of 66, sinus rhythm, QTC 458 1 L LR, Rocephin, Tylenol, Zofran, potassium given in the ED Admission requested for observation and further work-up of fever  Upon admission CT scan results: Concern for pancreatic malignancy causing biliary dilation, concern for peritoneal carcinomatosis   Review of systems:    In addition to the HPI above,  Admits to fever and chills No Headache, No changes with Vision or hearing, No problems swallowing food or Liquids, Admits to atypical chest pain, no cough or Shortness of Breath, Admits to abdominal pain, nausea, vomiting, bowel movements are regular, No Blood in stool or Urine, No dysuria, No new skin rashes or bruises, No new joints pains-aches,  No new weakness, tingling, numbness in any extremity, No recent weight gain or loss, No polyuria, polydypsia or polyphagia, No significant Mental Stressors.  All other systems reviewed and are negative.    Past History of the following :    Past Medical History:    Diagnosis Date   Candida esophagitis (HCC)    Chronic combined systolic (congestive) and diastolic (congestive) heart failure ([place])    a. 08/2016: echo showing EF of 45-50%, diffuse HK, Grade 1 DD, and mild MR   CKD (chronic kidney disease), stage III ([place]) 03/09/2017   Heart disease    Hyperlipemia    Hypertension    Iron deficiency anemia    Pneumonia    Thyroid activity decreased    Thyroid disease       Past Surgical History:  Procedure Laterality Date   COLONOSCOPY N/A 06/14/2016   Procedure: COLONOSCOPY;  Surgeon: [name], MD;  Location[facility];  Service: Endoscopy;  Laterality: N/A;   ESOPHAGOGASTRODUODENOSCOPY N/A 06/14/2016   Procedure: ESOPHAGOGASTRODUODENOSCOPY (EGD);  Surgeon: [name], MD;  Location: [facility];  Service: Endoscopy;  Laterality: N/A;  WITH DILATION    TONSILLECTOMY     [place] SINUS         Social History:      Social History   Tobacco Use   Smoking status: Never   Smokeless tobacco: Never  Substance Use Topics   Alcohol use: No       Family History :     Family History  Problem Relation Age of Onset   Asthma Mother    Congestive Heart Failure Mother    Emphysema Father    Heart Problems Brother    Colon cancer Neg Hx       Home Medications:   Prior to Admission medications   Medication Sig Start Date End Date Taking? Authorizing Provider  acetaminophen (TYLENOL) 500 MG tablet Take 500 mg by mouth every 6 (six) hours as needed for mild pain.   Yes [provider]  Calcium Citrate-Vitamin D (CALCIUM CITRATE + D PO) Take 1 tablet by mouth daily.   Yes [provider]  cetirizine (ZYRTEC) 10 MG tablet Take 10 mg by mouth daily. 09/09/20  Yes [provider]  cholecalciferol (VITAMIN D) 1000 units tablet Take 1,000 Units by mouth daily.   Yes [provider]  furosemide (LASIX) 40 MG tablet Take 40 mg by mouth daily.   Yes [provider]  gabapentin (NEURONTIN) 300 MG capsule Take 300 mg by mouth at bedtime. 06/06/16  Yes [provider]  levothyroxine (SYNTHROID, LEVOTHROID) 50 MCG tablet Take 50 mcg by mouth daily. 06/02/16  Yes [provider]  Multiple Vitamins-Minerals (PRESERVISION AREDS PO) Take 1 capsule by mouth 2 (two) times daily.    Yes [provider]  Omega-3 Fatty Acids (FISH OIL) 1000 MG CAPS Take 1,000 mg by mouth daily.   Yes [provider]  polyethylene glycol powder (GLYCOLAX/MIRALAX) 17 GM/SCOOP powder Take 0.5 Containers by mouth daily. 11/15/20  Yes [provider]  potassium chloride SA (K-DUR,KLOR-CON) 20 MEQ tablet Take 20 mEq by mouth daily.   Yes [provider]  pravastatin (PRAVACHOL) 40 MG tablet Take 40 mg by mouth daily. 03/16/16  Yes [provider]  torsemide (DEMADEX) 20 MG tablet Take 20 mg by mouth 2 (two) times  daily. 10/26/20  Yes [provider]  traZODone (DESYREL) 50 MG tablet Take 1 tablet (50 mg total) by mouth at bedtime as needed for sleep. 06/15/16  Yes Lucia Gaskins, MD  albuterol (PROVENTIL) (2.5 MG/3ML) 0.083% nebulizer solution Take 2.5 mg by nebulization 2 (two) times daily. Patient not taking: Reported on 11/27/2020 09/27/20   [provider]  bisoprolol (ZEBETA) 5 MG tablet Take  1 tablet (5 mg total) by mouth daily. 10/04/16   Dondiego, Richard, MD  famciclovir (FAMVIR) 500 MG tablet Take 500 mg by mouth 3 (three) times daily. 10/27/20   [provider]  ipratropium-albuterol (DUONEB) 0.5-2.5 (3) MG/3ML SOLN Take 3 mLs by nebulization every 6 (six) hours. Patient not taking: Reported on 11/27/2020 03/09/17   Rama, Christina P, MD  oxyCODONE (OXY IR/ROXICODONE) 5 MG immediate release tablet Take 5 mg by mouth daily. Patient not taking: Reported on 11/27/2020 10/27/20   [provider]  pantoprazole (PROTONIX) 40 MG tablet Take 40 mg by mouth daily.    [provider]  promethazine (PHENERGAN) 25 MG tablet Take 25 mg by mouth daily. Patient not taking: Reported on 11/27/2020 11/18/20   [provider]  trimethoprim-polymyxin b (POLYTRIM) ophthalmic solution SMARTSIG:In Eye(s) Patient not taking: Reported on 11/27/2020 08/18/20   [provider]     Allergies:    No Known Allergies   Physical Exam:   Vitals  Blood pressure (!) 150/75, pulse 97, temperature 98.4 F (36.9 C), temperature source Oral, resp. rate 20, height 5' 7.5" (1.715 m), weight 59 kg, SpO2 93 %.  1.  General: Patient lying supine in bed,  no acute distress   2. Psychiatric: Alert and oriented x 3, mood and behavior normal for situation, pleasant and cooperative with exam   3. Neurologic: Speech and language are normal, face is symmetric, moves all 4 extremities voluntarily, at baseline without acute deficits on limited exam   4. HEENMT:  Head is  atraumatic, normocephalic, pupils reactive to light, neck is supple, trachea is midline, mucous membranes are moist   5. Respiratory : Lungs are clear to auscultation bilaterally without wheezing, rhonchi, rales, no cyanosis, no increase in work of breathing or accessory muscle use   6. Cardiovascular : Heart rate normal, rhythm is regular, no murmurs, rubs or gallops, no peripheral edema, peripheral pulses palpated   7. Gastrointestinal:  Abdomen is soft, nondistended, diffusely tender to palpation without guarding, bowel sounds active,    8. Skin:  Skin is warm, dry and intact without rashes, acute lesions, or ulcers on limited exam   9.Musculoskeletal:  No acute deformities or trauma, no asymmetry in tone, no peripheral edema, peripheral pulses palpated, no tenderness to palpation in the extremities     Data Review:    CBC Recent Labs  Lab 11/27/20 1905  WBC 12.0*  HGB 13.2  HCT 39.2  PLT 254  MCV 100.3*  MCH 33.8  MCHC 33.7  RDW 14.6  LYMPHSABS 0.3*  MONOABS 0.8  EOSABS 0.0  BASOSABS 0.0   ------------------------------------------------------------------------------------------------------------------  Results for orders placed or performed during the hospital encounter of 11/27/20 (from the past 48 hour(s))  Comprehensive metabolic panel     Status: Abnormal   Collection Time: 11/27/20  7:05 PM  Result Value Ref Range   Sodium 135 135 - 145 mmol/L   Potassium 3.2 (L) 3.5 - 5.1 mmol/L   Chloride 100 98 - 111 mmol/L   CO2 25 22 - 32 mmol/L   Glucose, Bld 195 (H) 70 - 99 mg/dL    Comment: Glucose reference range applies only to samples taken after fasting for at least 8 hours.   BUN 19 8 - 23 mg/dL   Creatinine, Ser 1.17 (H) 0.44 - 1.00 mg/dL   Calcium 8.9 8.9 - 10.3 mg/dL   Total Protein 6.1 (L) 6.5 - 8.1 g/dL   Albumin 3.6 3.5 - 5.0 g/dL     AST 41 15 - 41 U/L   ALT 34 0 - 44 U/L   Alkaline Phosphatase 141 (H) 38 - 126 U/L   Total Bilirubin 1.0 0.3 - 1.2  mg/dL   GFR, Estimated 43 (L) >60 mL/min    Comment: (NOTE) Calculated using the CKD-EPI Creatinine Equation (2021)    Anion gap 10 5 - 15    Comment: Performed at [facility], [address] [place], [place] [address]  CBC with Differential     Status: Abnormal   Collection Time: 11/27/20  7:05 PM  Result Value Ref Range   WBC 12.0 (H) 4.0 - 10.5 K/uL   RBC 3.91 3.87 - 5.11 MIL/uL   Hemoglobin 13.2 12.0 - 15.0 g/dL   HCT 39.2 36.0 - 46.0 %   MCV 100.3 (H) 80.0 - 100.0 fL   MCH 33.8 26.0 - 34.0 pg   MCHC 33.7 30.0 - 36.0 g/dL   RDW 14.6 11.5 - 15.5 %   Platelets 254 150 - 400 K/uL   nRBC 0.0 0.0 - 0.2 %   Neutrophils Relative % 90 %   Neutro Abs 10.7 (H) 1.7 - 7.7 K/uL   Lymphocytes Relative 3 %   Lymphs Abs 0.3 (L) 0.7 - 4.0 K/uL   Monocytes Relative 7 %   Monocytes Absolute 0.8 0.1 - 1.0 K/uL   Eosinophils Relative 0 %   Eosinophils Absolute 0.0 0.0 - 0.5 K/uL   Basophils Relative 0 %   Basophils Absolute 0.0 0.0 - 0.1 K/uL   Immature Granulocytes 0 %   Abs Immature Granulocytes 0.05 0.00 - 0.07 K/uL    Comment: Performed at [facility], [address] [place], [place] [address]  Lipase, blood     Status: None   Collection Time: 11/27/20  7:05 PM  Result Value Ref Range   Lipase 30 11 - 51 U/L    Comment: Performed at [facility], [address] [place], [place] [address]  Lactic acid, plasma     Status: None   Collection Time: 11/27/20  7:05 PM  Result Value Ref Range   Lactic Acid, Venous 1.6 0.5 - 1.9 mmol/L    Comment: Performed at [facility], [address] [place], [place] [address]  Protime-INR     Status: None   Collection Time: 11/27/20  7:05 PM  Result Value Ref Range   Prothrombin Time 14.4 11.4 - 15.2 seconds   INR 1.1 0.8 - 1.2    Comment: (NOTE) INR goal varies based on device and disease states. Performed at [facility], [address] [place], [place] [address]   APTT     Status: None   Collection Time: 11/27/20  7:05 PM  Result Value Ref  Range   aPTT 31 24 - 36 seconds    Comment: Performed at [facility], [address] , [place] [address]  Resp Panel by RT-PCR (Flu A&B, Covid) Nasopharyngeal Swab     Status: None   Collection Time: 11/27/20  7:20 PM   Specimen: Nasopharyngeal Swab; Nasopharyngeal(NP) swabs in vial transport medium  Result Value Ref Range   SARS Coronavirus 2 by RT PCR NEGATIVE NEGATIVE    Comment: (NOTE) SARS-CoV-2 target nucleic acids are NOT DETECTED.  The SARS-CoV-2 RNA is generally detectable in upper respiratory specimens during the acute phase of infection. The lowest concentration of SARS-CoV-2 viral copies this assay can detect is 138 copies/mL. A negative result does not preclude SARS-Cov-2 infection and should not be used as   the sole basis for treatment or other patient management decisions. A negative result may occur with  improper specimen collection/handling, submission of specimen other than nasopharyngeal swab, presence of viral mutation(s) within the areas targeted by this assay, and inadequate number of viral copies(<138 copies/mL). A negative result must be combined with clinical observations, patient history, and epidemiological information. The expected result is Negative.  Fact Sheet for Patients:  EntrepreneurPulse.com.au  Fact Sheet for Healthcare Providers:  IncredibleEmployment.be  This test is no t yet approved or cleared by the [place] FDA and  has been authorized for detection and/or diagnosis of SARS-CoV-2 by FDA under an Emergency Use Authorization (EUA). This EUA will remain  in effect (meaning this test can be used) for the duration of the COVID-19 declaration under Section 564(b)(1) of the Act, 21 U.S.C.section 360bbb-3(b)(1), unless the authorization is terminated  or revoked sooner.       Influenza A by PCR NEGATIVE NEGATIVE   Influenza B by PCR NEGATIVE NEGATIVE    Comment: (NOTE) The Xpert Xpress  SARS-CoV-2/FLU/RSV plus assay is intended as an aid in the diagnosis of influenza from Nasopharyngeal swab specimens and should not be used as a sole basis for treatment. Nasal washings and aspirates are unacceptable for Xpert Xpress SARS-CoV-2/FLU/RSV testing.  Fact Sheet for Patients: EntrepreneurPulse.com.au  Fact Sheet for Healthcare Providers: IncredibleEmployment.be  This test is not yet approved or cleared by the [place] FDA and has been authorized for detection and/or diagnosis of SARS-CoV-2 by FDA under an Emergency Use Authorization (EUA). This EUA will remain in effect (meaning this test can be used) for the duration of the COVID-19 declaration under Section 564(b)(1) of the Act, 21 U.S.C. section 360bbb-3(b)(1), unless the authorization is terminated or revoked.  Performed at [facility], [address], [place], [place] [address]   Urinalysis, Routine w reflex microscopic Urine, Catheterized     Status: Abnormal   Collection Time: 11/27/20  9:00 PM  Result Value Ref Range   Color, Urine YELLOW YELLOW   APPearance CLEAR CLEAR   Specific Gravity, Urine 1.011 1.005 - 1.030   pH 5.0 5.0 - 8.0   Glucose, UA NEGATIVE NEGATIVE mg/dL   Hgb urine dipstick NEGATIVE NEGATIVE   Bilirubin Urine NEGATIVE NEGATIVE   Ketones, ur 5 (A) NEGATIVE mg/dL   Protein, ur NEGATIVE NEGATIVE mg/dL   Nitrite NEGATIVE NEGATIVE   Leukocytes,Ua NEGATIVE NEGATIVE    Comment: Performed at [facility], [address] [place], [place] [address]  Lactic acid, plasma     Status: None   Collection Time: 11/27/20  9:05 PM  Result Value Ref Range   Lactic Acid, Venous 1.5 0.5 - 1.9 mmol/L    Comment: Performed at [facility], [address] [place], [place] [address]  CK     Status: None   Collection Time: 11/27/20  9:05 PM  Result Value Ref Range   Total CK 57 38 - 234 U/L    Comment: Performed at [facility], [address], [place], [place] [address]     Chemistries  Recent Labs  Lab 11/27/20 1905  NA 135  K 3.2*  CL 100  CO2 25  GLUCOSE 195*  BUN 19  CREATININE 1.17*  CALCIUM 8.9  AST 41  ALT 34  ALKPHOS 141*  BILITOT 1.0   ------------------------------------------------------------------------------------------------------------------  ------------------------------------------------------------------------------------------------------------------ GFR: Estimated Creatinine Clearance: 26.8 mL/min (A) (by C-G formula based on SCr of 1.17 mg/dL (H)). Liver Function Tests: Recent Labs  Lab 11/27/20 1905  AST 41  ALT 34  ALKPHOS 141*  BILITOT 1.0  PROT 6.1*  ALBUMIN 3.6   Recent Labs  Lab 11/27/20 1905  LIPASE 30   No results for input(s): AMMONIA in the last 168 hours. Coagulation Profile: Recent Labs  Lab 11/27/20 1905  INR 1.1   Cardiac Enzymes: Recent Labs  Lab 11/27/20 2105  CKTOTAL 57   BNP (last 3 results) No results for input(s): PROBNP in the last 8760 hours. HbA1C: No results for input(s): HGBA1C in the last 72 hours. CBG: No results for input(s): GLUCAP in the last 168 hours. Lipid Profile: No results for input(s): CHOL, HDL, LDLCALC, TRIG, CHOLHDL, LDLDIRECT in the last 72 hours. Thyroid Function Tests: No results for input(s): TSH, T4TOTAL, FREET4, T3FREE, THYROIDAB in the last 72 hours. Anemia Panel: No results for input(s): VITAMINB12, FOLATE, FERRITIN, TIBC, IRON, RETICCTPCT in the last 72 hours.  --------------------------------------------------------------------------------------------------------------- Urine analysis:    Component Value Date/Time   COLORURINE YELLOW 11/27/2020 2100   APPEARANCEUR CLEAR 11/27/2020 2100   LABSPEC 1.011 11/27/2020 2100   PHURINE 5.0 11/27/2020 2100   GLUCOSEU NEGATIVE 11/27/2020 2100   HGBUR NEGATIVE 11/27/2020 2100   BILIRUBINUR NEGATIVE 11/27/2020 2100   KETONESUR 5 (A) 11/27/2020 2100   PROTEINUR NEGATIVE 11/27/2020 2100   NITRITE  NEGATIVE 11/27/2020 2100   LEUKOCYTESUR NEGATIVE 11/27/2020 2100      Imaging Results:    CT ABDOMEN PELVIS WO CONTRAST  Result Date: 11/28/2020 CLINICAL DATA:  Acute, nonlocalized abdominal pain. Vomiting. Altered mental status EXAM: CT ABDOMEN AND PELVIS WITHOUT CONTRAST TECHNIQUE: Multidetector CT imaging of the abdomen and pelvis was performed following the standard protocol without IV contrast. COMPARISON:  None. FINDINGS: Lower chest: The visualized lung bases are clear bilaterally. Mild multi-vessel coronary artery calcification. Global cardiac size within normal limits. Hepatobiliary: There is mild intrahepatic and moderate extrahepatic biliary ductal dilation. There is probable periportal edema identified best appreciated within segment 2 and segment 7 of the liver peripherally. The gallbladder is distended and there is minimal infiltration of the pericholecystic fat which may be inflammatory, but is nonspecific given the presence of a trace perihepatic ascites. No definite focal intrahepatic mass identified. Pancreas: There is asymmetric thickening of the head of the pancreas, not well assessed on this noncontrast examination. An underlying pancreatic mass or focal inflammatory change could appear in this fashion. Spleen: Unremarkable Adrenals/Urinary Tract: The adrenal glands are unremarkable. The kidneys are normal in size and position. Simple endophytic cortical cyst noted within the interpolar region of the left kidney. Indeterminate 14 mm cystic lesion arises from the upper pole of the right kidney, not well assessed on this noncontrast examination. The kidneys are otherwise unremarkable. The bladder is unremarkable peer Stomach/Bowel: There is omental nodularity and infiltration most in keeping with peritoneal carcinomatosis. Trace ascites noted. The stomach, small bowel, and large bowel are otherwise unremarkable. The appendix is not clearly identified on this examination. No free  intraperitoneal gas. Vascular/Lymphatic: Moderate aortoiliac atherosclerotic calcification. No aortic aneurysm. No pathologic adenopathy within the abdomen and pelvis. Reproductive: Uterus and bilateral adnexa are unremarkable. Other: No abdominal wall hernia identified. Musculoskeletal: No acute bone abnormality. Osseous structures are age-appropriate. No lytic or blastic bone lesions identified. IMPRESSION: Technically limited examination in absence of contrast administration. Asymmetric thickening of the a head of the pancreas concerning for an underlying pancreatic malignancy given the associated peritoneal nodularity and infiltration suggestive of peritoneal carcinomatosis. This is not well assessed on this examination and dedicated pancreatic protocol CT or MRI   examination recommended for further evaluation. Mild to moderate intra and extrahepatic biliary ductal dilation and distension of the gallbladder. Suggestion of a peripheral periportal edema. This may reflect obstruction related to the process within the head of the pancreas. An intrinsic stricture or inflammatory process, however, could appear similarly. Correlation with liver enzymes is recommended. Dedicated sonography may also be helpful to exclude a primary inflammatory process of the gallbladder. Indeterminate 14 mm cystic lesion within the upper pole the right kidney. This could be simultaneously assessed with MRI examination of the pancreas and liver. Aortic Atherosclerosis (ICD10-I70.0). Electronically Signed   By: Fidela Salisbury MD   On: 11/28/2020 00:19   DG Chest Portable 1 View  Result Date: 11/27/2020 CLINICAL DATA:  Status post fall. EXAM: PORTABLE CHEST 1 VIEW COMPARISON:  May 11, 2017 FINDINGS: Chronic appearing increased lung markings are seen without evidence of acute infiltrate, pleural effusion or pneumothorax. Stable linear scarring is seen within the right lung base. The heart size and mediastinal contours are within  normal limits. Marked severity calcification of the thoracic aorta is noted. Degenerative changes seen throughout the thoracic spine. IMPRESSION: Stable exam without acute or active cardiopulmonary disease. Electronically Signed   By: Virgina Norfolk M.D.   On: 11/27/2020 20:37       Assessment & Plan:    Active Problems:   Hypothyroidism   GERD (gastroesophageal reflux disease)   Intractable nausea and vomiting   SIRS (systemic inflammatory response syndrome) (HCC)   Hypokalemia   SIRS Febrile, tachycardic, tachypneic, leukocytosis of 12 Lactic acid normal, no sign of endorgan damage Currently treating for an intra-abdominal infection with Zosyn, right upper quadrant ultrasound in the a.m. Blood cultures pending, urine culture pending Chest x-ray is a stable exam without acute disease, no respiratory symptoms Respiratory panel is negative Continue to monitor Hypokalemia Secondary to poor p.o. intake and GI losses Potassium 3.2, replaced with 40 mEq in the ED Trend in the a.m. Intractable nausea and vomiting Most likely secondary to an intra-abdominal infection Zofran for symptom control Thyroid disease Continue Synthroid Check TSH in the a.m. Hypertension Continue Zebeta   DVT Prophylaxis-   Heparin- SCDs   AM Labs Ordered, also please review Full Orders  Family Communication: No family at bedside Code Status: DNR with: Take it at bedside  Admission status: Observation Time spent in minutes : [place] DO

## 2020-11-29 ENCOUNTER — Encounter (INDEPENDENT_AMBULATORY_CARE_PROVIDER_SITE_OTHER): Payer: Medicare Other | Admitting: Ophthalmology

## 2020-11-29 ENCOUNTER — Inpatient Hospital Stay (HOSPITAL_COMMUNITY): Payer: Medicare Other

## 2020-11-29 DIAGNOSIS — K8689 Other specified diseases of pancreas: Secondary | ICD-10-CM

## 2020-11-29 DIAGNOSIS — K831 Obstruction of bile duct: Secondary | ICD-10-CM

## 2020-11-29 LAB — URINE CULTURE: Culture: NO GROWTH

## 2020-11-29 LAB — COMPREHENSIVE METABOLIC PANEL
ALT: 43 U/L (ref 0–44)
AST: 52 U/L — ABNORMAL HIGH (ref 15–41)
Albumin: 2.8 g/dL — ABNORMAL LOW (ref 3.5–5.0)
Alkaline Phosphatase: 127 U/L — ABNORMAL HIGH (ref 38–126)
Anion gap: 8 (ref 5–15)
BUN: 17 mg/dL (ref 8–23)
CO2: 27 mmol/L (ref 22–32)
Calcium: 8.4 mg/dL — ABNORMAL LOW (ref 8.9–10.3)
Chloride: 101 mmol/L (ref 98–111)
Creatinine, Ser: 1.2 mg/dL — ABNORMAL HIGH (ref 0.44–1.00)
GFR, Estimated: 42 mL/min — ABNORMAL LOW (ref >60)
Glucose, Bld: 114 mg/dL — ABNORMAL HIGH (ref 70–99)
Potassium: 3.6 mmol/L (ref 3.5–5.1)
Sodium: 136 mmol/L (ref 135–145)
Total Bilirubin: 0.7 mg/dL (ref 0.3–1.2)
Total Protein: 5.5 g/dL — ABNORMAL LOW (ref 6.5–8.1)

## 2020-11-29 LAB — CBC
HCT: 35.8 % — ABNORMAL LOW (ref 36.0–46.0)
Hemoglobin: 11.9 g/dL — ABNORMAL LOW (ref 12.0–15.0)
MCH: 34 pg (ref 26.0–34.0)
MCHC: 33.2 g/dL (ref 30.0–36.0)
MCV: 102.3 fL — ABNORMAL HIGH (ref 80.0–100.0)
Platelets: 251 10*3/uL (ref 150–400)
RBC: 3.5 MIL/uL — ABNORMAL LOW (ref 3.87–5.11)
RDW: 14.6 % (ref 11.5–15.5)
WBC: 7.8 10*3/uL (ref 4.0–10.5)
nRBC: 0 % (ref 0.0–0.2)

## 2020-11-29 LAB — MAGNESIUM: Magnesium: 2.3 mg/dL (ref 1.7–2.4)

## 2020-11-29 MED ORDER — POLYETHYLENE GLYCOL 3350 17 G PO PACK
17.0000 g | PACK | Freq: Every day | ORAL | Status: DC | PRN
  Administered 2020-11-29: 17 g via ORAL
  Filled 2020-11-29: qty 1

## 2020-11-29 MED ORDER — SENNOSIDES-DOCUSATE SODIUM 8.6-50 MG PO TABS
1.0000 | ORAL_TABLET | Freq: Every day | ORAL | Status: DC
  Administered 2020-11-29 – 2020-11-30 (×2): 1 via ORAL
  Filled 2020-11-29 (×2): qty 1

## 2020-11-29 MED ORDER — HYDRALAZINE HCL 20 MG/ML IJ SOLN: 10.0000 mg | INTRAMUSCULAR | Status: DC | PRN

## 2020-11-29 MED ORDER — POLYETHYLENE GLYCOL 3350 17 G PO PACK
17.0000 g | PACK | Freq: Every day | ORAL | Status: DC
  Administered 2020-12-01: 17 g via ORAL
  Filled 2020-11-29: qty 1

## 2020-11-29 NOTE — Progress Notes (Signed)
Discussed with Dr. Laural Golden. Plan for ERCP tomorrow midday. I spoke to patient and her son at bedside extensively regarding risks and benefits. She desires to proceed. She expressed again that she desires comfort measures and forgoing any oncology evaluation. She was in good spirits and at peace with prognosis.   Katelyn Needs, PhD, ANP-BC [facility]

## 2020-11-29 NOTE — Progress Notes (Addendum)
Subjective: Poor appetite. No significant pain with eating but states she is not eating much. No nausea today. No fever or chills. Doesn't feel like eating when laying in bed. Feels constipated. Takes Miralax at home.   Objective: Vital signs in last 24 hours: Temp:  [97.8 F (36.6 C)-98.8 F (37.1 C)] 98.8 F (37.1 C) (06/27 0514) Pulse Rate:  [49-129] 67 (06/27 0514) Resp:  [12-23] 18 (06/27 0514) BP: (115-172)/(52-100) 172/80 (06/27 0514) SpO2:  [86 %-100 %] 95 % (06/27 0514) Weight:  [60.6 kg] 60.6 kg (06/26 1535) Last BM Date: 11/26/20 General:   Alert and oriented, pleasant Head:  Normocephalic and atraumatic. Eyes:  No icterus, sclera clear. Conjuctiva pink.  Abdomen:  Bowel sounds present, soft, full lower abdomen but not tense, non-tender Msk:  Symmetrical without gross deformities.  Extremities:  Without edema. Neurologic:  Alert and  oriented x4 Psych:  Alert and cooperative. Normal mood and affect.  Intake/Output from previous day: 06/26 0701 - 06/27 0700 In: 440.8 [P.O.:360; IV Piggyback:80.8] Out: 300 [Urine:300] Intake/Output this shift: No intake/output data recorded.  Lab Results: Recent Labs    11/27/20 1905 11/28/20 0519 11/29/20 0555  WBC 12.0* 10.7* 7.8  HGB 13.2 11.6* 11.9*  HCT 39.2 34.4* 35.8*  PLT 254 242 251   BMET Recent Labs    11/27/20 1905 11/28/20 0519 11/29/20 0555  NA 135 136 136  K 3.2* 4.0 3.6  CL 100 102 101  CO2 _0 GLUCOSE 195* 135* 114*  BUN _1 CREATININE 1.17* 1.18* 1.20*  CALCIUM 8.9 8.5* 8.4*   LFT Recent Labs    11/27/20 1905 11/28/20 0519 11/29/20 0555  PROT 6.1* 5.4* 5.5*  ALBUMIN 3.6 2.9* 2.8*  AST 41 39 52*  ALT 34 33 43  ALKPHOS 141* 112 127*  BILITOT 1.0 0.6 0.7   PT/INR Recent Labs    11/27/20 1905  LABPROT 14.4  INR 1.1     Studies/Results: CT ABDOMEN PELVIS WO CONTRAST  Result Date: 11/28/2020 CLINICAL DATA:  Acute, nonlocalized abdominal pain. Vomiting. Altered  mental status EXAM: CT ABDOMEN AND PELVIS WITHOUT CONTRAST TECHNIQUE: Multidetector CT imaging of the abdomen and pelvis was performed following the standard protocol without IV contrast. COMPARISON:  None. FINDINGS: Lower chest: The visualized lung bases are clear bilaterally. Mild multi-vessel coronary artery calcification. Global cardiac size within normal limits. Hepatobiliary: There is mild intrahepatic and moderate extrahepatic biliary ductal dilation. There is probable periportal edema identified best appreciated within segment 2 and segment 7 of the liver peripherally. The gallbladder is distended and there is minimal infiltration of the pericholecystic fat which may be inflammatory, but is nonspecific given the presence of a trace perihepatic ascites. No definite focal intrahepatic mass identified. Pancreas: There is asymmetric thickening of the head of the pancreas, not well assessed on this noncontrast examination. An underlying pancreatic mass or focal inflammatory change could appear in this fashion. Spleen: Unremarkable Adrenals/Urinary Tract: The adrenal glands are unremarkable. The kidneys are normal in size and position. Simple endophytic cortical cyst noted within the interpolar region of the left kidney. Indeterminate 14 mm cystic lesion arises from the upper pole of the right kidney, not well assessed on this noncontrast examination. The kidneys are otherwise unremarkable. The bladder is unremarkable peer Stomach/Bowel: There is omental nodularity and infiltration most in keeping with peritoneal carcinomatosis. Trace ascites noted. The stomach, small bowel, and large bowel are otherwise unremarkable. The appendix is not clearly identified on this examination.  No free intraperitoneal gas. Vascular/Lymphatic: Moderate aortoiliac atherosclerotic calcification. No aortic aneurysm. No pathologic adenopathy within the abdomen and pelvis. Reproductive: Uterus and bilateral adnexa are unremarkable.  Other: No abdominal wall hernia identified. Musculoskeletal: No acute bone abnormality. Osseous structures are age-appropriate. No lytic or blastic bone lesions identified. IMPRESSION: Technically limited examination in absence of contrast administration. Asymmetric thickening of the a head of the pancreas concerning for an underlying pancreatic malignancy given the associated peritoneal nodularity and infiltration suggestive of peritoneal carcinomatosis. This is not well assessed on this examination and dedicated pancreatic protocol CT or MRI examination recommended for further evaluation. Mild to moderate intra and extrahepatic biliary ductal dilation and distension of the gallbladder. Suggestion of a peripheral periportal edema. This may reflect obstruction related to the process within the head of the pancreas. An intrinsic stricture or inflammatory process, however, could appear similarly. Correlation with liver enzymes is recommended. Dedicated sonography may also be helpful to exclude a primary inflammatory process of the gallbladder. Indeterminate 14 mm cystic lesion within the upper pole the right kidney. This could be simultaneously assessed with MRI examination of the pancreas and liver. Aortic Atherosclerosis (ICD10-I70.0). Electronically Signed   By: Fidela Salisbury MD   On: 11/28/2020 00:19   MR ABDOMEN MRCP WO CONTRAST  Result Date: 11/29/2020 CLINICAL DATA:  RIGHT upper quadrant abdominal pain, nondiagnostic ultrasound. EXAM: MRI ABDOMEN WITHOUT CONTRAST  (INCLUDING MRCP) TECHNIQUE: Multiplanar multisequence MR imaging of the abdomen was performed. Heavily T2-weighted images of the biliary and pancreatic ducts were obtained, and three-dimensional MRCP images were rendered by post processing. COMPARISON:  Abdominal sonogram November 28, 2020. CT of the abdomen and pelvis of November 27, 2020. FINDINGS: Lower chest: Trace effusions.  No consolidation. Hepatobiliary: Marked distension of the gallbladder.  Abrupt termination of the biliary tree at the level of a pancreatic mass. Sludge in the dependent gallbladder. Variable signal in the hepatic parenchyma in hepatic subsegment VIII with some segmental biliary duct dilation is nonspecific. Pericholecystic stranding and edema in the setting of ascites and peritoneal disease without significant gallbladder wall thickening. There is generalized edema throughout the abdomen. Generalized edema is noted throughout the retroperitoneum and there is small volume ascites. Pancreas: Minimal intrinsic T1 signal remaining in the pancreas in the pancreatic head. The large pancreatic mass, ill-defined and invading/encasing surrounding structures. Limited assessment due to lack of contrast. Pilar Plate in case mint of the celiac is noted. Mass tracking into hepatic duodenal ligament with peripheral ductal dilation atrophy may measure as much as 8 x 3.4 cm. Common bile duct obstructed at the upper margin of the mass. Peripheral pancreatic duct also with dilation peripherally. Masslike area contacting the posterior stomach. Restricted diffusion within peritoneal nodularity that was seen on the previous study for instance on image 27 of series 8 a 13 mm nodule in the anterior peritoneum. Adenopathy in the region of the RIGHT diaphragmatic crus (image 17/8) 12 mm. Spleen:  Normal spleen. Adrenals/Urinary Tract:  Adrenal glands are normal. Cyst in the upper pole the RIGHT kidney and renal sinus cysts on the LEFT. No hydronephrosis. Stomach/Bowel: No acute gastrointestinal process to the extent evaluated on MR. Suspected tumor contacts posterior wall the stomach. Vascular/Lymphatic: Vascular involvement in the upper abdomen with frank involvement of the celiac and its branches best seen on image 18 and 19 of series 8. Other: Signs of ascites as seen on recent CT of the abdomen and pelvis. Peritoneal nodularity also as outlined above. Musculoskeletal: No suspicious bone lesions identified.  IMPRESSION: Positive  for infiltrative pancreatic neoplasm vascular involvement, peritoneal disease and upper abdominal nodal enlargement. Biliary obstruction secondary to large mass with continued distension of the gallbladder without significant wall thickening. Lack of contrast limiting assessment. No specific findings of cholecystitis with generalized edema in the upper abdomen and ascites/peritoneal disease. Area of signal abnormality in segmental biliary duct distension potentially related to cholangitis. Underlying small hepatic lesion could lead to this appearance as well, attention on follow-up. Electronically Signed   By: Zetta Bills M.D.   On: 11/29/2020 10:15   MR 3D Recon At Scanner  Result Date: 11/29/2020 CLINICAL DATA:  RIGHT upper quadrant abdominal pain, nondiagnostic ultrasound. EXAM: MRI ABDOMEN WITHOUT CONTRAST  (INCLUDING MRCP) TECHNIQUE: Multiplanar multisequence MR imaging of the abdomen was performed. Heavily T2-weighted images of the biliary and pancreatic ducts were obtained, and three-dimensional MRCP images were rendered by post processing. COMPARISON:  Abdominal sonogram November 28, 2020. CT of the abdomen and pelvis of November 27, 2020. FINDINGS: Lower chest: Trace effusions.  No consolidation. Hepatobiliary: Marked distension of the gallbladder. Abrupt termination of the biliary tree at the level of a pancreatic mass. Sludge in the dependent gallbladder. Variable signal in the hepatic parenchyma in hepatic subsegment VIII with some segmental biliary duct dilation is nonspecific. Pericholecystic stranding and edema in the setting of ascites and peritoneal disease without significant gallbladder wall thickening. There is generalized edema throughout the abdomen. Generalized edema is noted throughout the retroperitoneum and there is small volume ascites. Pancreas: Minimal intrinsic T1 signal remaining in the pancreas in the pancreatic head. The large pancreatic mass, ill-defined and  invading/encasing surrounding structures. Limited assessment due to lack of contrast. Pilar Plate in case mint of the celiac is noted. Mass tracking into hepatic duodenal ligament with peripheral ductal dilation atrophy may measure as much as 8 x 3.4 cm. Common bile duct obstructed at the upper margin of the mass. Peripheral pancreatic duct also with dilation peripherally. Masslike area contacting the posterior stomach. Restricted diffusion within peritoneal nodularity that was seen on the previous study for instance on image 27 of series 8 a 13 mm nodule in the anterior peritoneum. Adenopathy in the region of the RIGHT diaphragmatic crus (image 17/8) 12 mm. Spleen:  Normal spleen. Adrenals/Urinary Tract:  Adrenal glands are normal. Cyst in the upper pole the RIGHT kidney and renal sinus cysts on the LEFT. No hydronephrosis. Stomach/Bowel: No acute gastrointestinal process to the extent evaluated on MR. Suspected tumor contacts posterior wall the stomach. Vascular/Lymphatic: Vascular involvement in the upper abdomen with frank involvement of the celiac and its branches best seen on image 18 and 19 of series 8. Other: Signs of ascites as seen on recent CT of the abdomen and pelvis. Peritoneal nodularity also as outlined above. Musculoskeletal: No suspicious bone lesions identified. IMPRESSION: Positive for infiltrative pancreatic neoplasm vascular involvement, peritoneal disease and upper abdominal nodal enlargement. Biliary obstruction secondary to large mass with continued distension of the gallbladder without significant wall thickening. Lack of contrast limiting assessment. No specific findings of cholecystitis with generalized edema in the upper abdomen and ascites/peritoneal disease. Area of signal abnormality in segmental biliary duct distension potentially related to cholangitis. Underlying small hepatic lesion could lead to this appearance as well, attention on follow-up. Electronically Signed   By: Zetta Bills  M.D.   On: 11/29/2020 10:15   DG Chest Portable 1 View  Result Date: 11/27/2020 CLINICAL DATA:  Status post fall. EXAM: PORTABLE CHEST 1 VIEW COMPARISON:  May 11, 2017 FINDINGS: Chronic appearing increased lung  markings are seen without evidence of acute infiltrate, pleural effusion or pneumothorax. Stable linear scarring is seen within the right lung base. The heart size and mediastinal contours are within normal limits. Marked severity calcification of the thoracic aorta is noted. Degenerative changes seen throughout the thoracic spine. IMPRESSION: Stable exam without acute or active cardiopulmonary disease. Electronically Signed   By: Virgina Norfolk M.D.   On: 11/27/2020 20:37   US Abdomen Limited RUQ (LIVER/GB)  Result Date: 11/28/2020 CLINICAL DATA:  Abnormal CT.  Follow-up exam. EXAM: ULTRASOUND ABDOMEN LIMITED RIGHT UPPER QUADRANT COMPARISON:  CT, 11/27/2020. FINDINGS: Gallbladder: Gallbladder is distended. No shadowing stones. No gallbladder wall thickening or pericholecystic fluid. Common bile duct: Diameter: Dilated to 1.1 cm, mid to distal portion. No visualized stone. Liver: Heterogeneous echogenicity. Normal size. No mass or focal lesion. Portal vein is patent on color Doppler imaging with normal direction of blood flow towards the liver. Other: None. IMPRESSION: 1. Dilated mid to distal common bile duct without evidence of a duct stone. CT findings suspicious for a pancreatic head mass. This would be best assessed with pancreatic MRI without and with contrast, alternatively pancreatic protocol CT with contrast. 2. Heterogeneous echogenicity of the liver without a defined mass. 3. Distended gallbladder, but no stones or evidence of acute cholecystitis. Electronically Signed   By: Lajean Manes M.D.   On: 11/28/2020 10:19    Assessment: Delightful 87 year old female presenting to the ED on 6/25 with progressive anorexia, nausea, weakness, weight loss, abdominal pressure, and febrile in  ED with Tmax of 102.8, found to have asymmetric thickening of pancreatic head and mild to moderate intra and extrahepatic biliary ductal dilation LFTs have been unimpressive except for mildly elevated alk phos (141 on admission and 127 today) and AST today 52. Mild leukocytosis resolved this morning (12 on admission now 7.8). MRCP just completed this morning with infiltrative pancreatic neoplasm, peritoneal disease, upper abdominal nodal enlargement. Biliary obstruction also appreciated due to large mass. Due to concerns for cholangitis, she was started on antibiotics on admission.   This morning, she denies outright pain but endorses poor appetite and feels constipated. Remains afebrile. Patient has previously expressed per documentation that she would not want to pursue further measures such as chemotherapy or surgery. I am reaching out to Dr. Laural Golden regarding palliative biliary stent placement.    Plan: Continue antibiotics Supportive measures Reaching out to Dr. Laural Golden regarding palliative stent measures Start Miralax daily Continue full liquids    Annitta Needs, PhD, ANP-BC [facility]    LOS: 1 day    11/29/2020, 10:26 AM

## 2020-11-29 NOTE — Progress Notes (Signed)
PROGRESS NOTE   Katelyn Daniel  WUX:[id number] DOB: 1923-07-18 DOA: 11/27/2020 PCP: Lucia Gaskins, MD   Chief Complaint  Patient presents with   Fall    Fever    Level of care: Med-Surg  Brief Admission History:   87 y.o. female, with history of Combined CHF, CKD, HLD, HTN, thyroid disease and more presents to the ED with a chief complaint of nausea and vomiting. She reports that it started yesterday.She reports that was having episodes of non-bloody emesis q15 mins.  She reports associated generalized weakness, and today she slumped onto the floor and was unable to get up.  She reports that she did not fall she had been sitting on the couch and then she slumped down to the floor.  She reports a pressure like pain in her abdomen that she thinks was more concentrated in her lower abdomen.  She reports some diarrhea that she thinks was nonbloody.  She reports that she has macular degeneration so her vision is not good and she cannot be sure that there was no blood.  She had associated fever and chills.  She reports chronic abdominal pain and back pain from shingles.  3 days ago she did have chest pain directly under her life alert necklace.  She reports the pain was resolved with taking off the necklace.  She denies any dyspnea or palpitations.  She denies any dysuria.  She is oriented x3.  Patient has no other complaints at this time.  Assessment & Plan:   Principal Problem:   Pancreatic mass Active Problems:   Hypothyroidism   Intractable nausea and vomiting   SIRS (systemic inflammatory response syndrome) (HCC)   Hypokalemia   Abdominal pain   Fever  Suspected pancreatic cancer with metastasis - Pt has findings of biliary obstruction and GI consult requested and they are recommending palliative biliary stent placement pending results of MRI.  I had a long discussion with patient and she is aware that the prognosis is grave. She says she would not want chemotherapy or surgery and she  is agreeable with palliative care. She is deciding if she wants to go home with palliative services or to pursue residential hospice placement in [place].  GI is discussing with Dr. Laural Golden regarding palliative biliary stent placement.    Hypothyroidism - resumed home levothyroxine daily.    Essential hypertension - resumed home medications.   Intractable nausea and vomiting - she is improving with supportive care.    DNR present on admission - continue DNR order per wishes in hospital.    DVT prophylaxis: SQ heparin/SCD Code Status: DNR Family Communication: son by telephone  Disposition: TBD  Status is: Inpatient  Remains inpatient appropriate because:Ongoing active pain requiring inpatient pain management, IV treatments appropriate due to intensity of illness or inability to take PO, and Inpatient level of care appropriate due to severity of illness  Dispo: The patient is from: Home              Anticipated d/c is to:  TBD              Patient currently is not medically stable to d/c.   Difficult to place patient No   Consultants:  GI Palliative care   Procedures:  MRI pending   Antimicrobials:  Zosyn IV 6/26>>   Subjective: Pt doesn't have much appetite. She is not having any abdominal pain at this time.   Objective: Vitals:   11/28/20 1615 11/28/20 2030 11/29/20 0514 11/29/20 1348  BP: (!) 123/91 (!) 155/63 (!) 172/80 138/65  Pulse:  (!) 58 67 63  Resp:  16 18   Temp: 98.1 F (36.7 C) 98.8 F (37.1 C) 98.8 F (37.1 C) 98.1 F (36.7 C)  TempSrc: Oral   Oral  SpO2:  95% 95% 96%  Weight:      Height:        Intake/Output Summary (Last 24 hours) at 11/29/2020 1440 Last data filed at 11/29/2020 3664 Gross per 24 hour  Intake 440.79 ml  Output 300 ml  Net 140.79 ml   Filed Weights   11/27/20 1748 11/28/20 1535  Weight: 59 kg 60.6 kg    Examination:  General exam: elderly pleasant female, awake, alert, Appears calm and comfortable  Respiratory system:  Clear to auscultation. Respiratory effort normal. Cardiovascular system: normal S1 & S2 heard. No JVD, murmurs, rubs, gallops or clicks. No pedal edema. Gastrointestinal system: Abdomen is nondistended, soft and mild epigastric tenderness. No organomegaly or masses felt. Normal bowel sounds heard. Central nervous system: Alert and oriented. No focal neurological deficits. Extremities: Symmetric 5 x 5 power. Skin: No rashes, lesions or ulcers Psychiatry: Judgement and insight appear normal. Mood & affect appropriate.   Data Reviewed: I have personally reviewed following labs and imaging studies  CBC: Recent Labs  Lab 11/27/20 1905 11/28/20 0519 11/29/20 0555  WBC 12.0* 10.7* 7.8  NEUTROABS 10.7*  --   --   HGB 13.2 11.6* 11.9*  HCT 39.2 34.4* 35.8*  MCV 100.3* 102.4* 102.3*  PLT 254 242 403    Basic Metabolic Panel: Recent Labs  Lab 11/27/20 1905 11/28/20 0519 11/29/20 0555  NA 135 136 136  K 3.2* 4.0 3.6  CL 100 102 101  CO2 25 27 27   GLUCOSE 195* 135* 114*  BUN 19 19 17   CREATININE 1.17* 1.18* 1.20*  CALCIUM 8.9 8.5* 8.4*  MG  --   --  2.3    GFR: Estimated Creatinine Clearance: 26.8 mL/min (A) (by C-G formula based on SCr of 1.2 mg/dL (H)).  Liver Function Tests: Recent Labs  Lab 11/27/20 1905 11/28/20 0519 11/29/20 0555  AST 41 39 52*  ALT 34 33 43  ALKPHOS 141* 112 127*  BILITOT 1.0 0.6 0.7  PROT 6.1* 5.4* 5.5*  ALBUMIN 3.6 2.9* 2.8*    CBG: No results for input(s): GLUCAP in the last 168 hours.  Recent Results (from the past 240 hour(s))  Resp Panel by RT-PCR (Flu A&B, Covid) Nasopharyngeal Swab     Status: None   Collection Time: 11/27/20  7:20 PM   Specimen: Nasopharyngeal Swab; Nasopharyngeal(NP) swabs in vial transport medium  Result Value Ref Range Status   SARS Coronavirus 2 by RT PCR NEGATIVE NEGATIVE Final    Comment: (NOTE) SARS-CoV-2 target nucleic acids are NOT DETECTED.  The SARS-CoV-2 RNA is generally detectable in upper  respiratory specimens during the acute phase of infection. The lowest concentration of SARS-CoV-2 viral copies this assay can detect is 138 copies/mL. A negative result does not preclude SARS-Cov-2 infection and should not be used as the sole basis for treatment or other patient management decisions. A negative result may occur with  improper specimen collection/handling, submission of specimen other than nasopharyngeal swab, presence of viral mutation(s) within the areas targeted by this assay, and inadequate number of viral copies(<138 copies/mL). A negative result must be combined with clinical observations, patient history, and epidemiological information. The expected result is Negative.  Fact Sheet for Patients:  EntrepreneurPulse.com.au  Fact Sheet  for Healthcare Providers:  IncredibleEmployment.be  This test is no t yet approved or cleared by the [place] and  has been authorized for detection and/or diagnosis of SARS-CoV-2 by FDA under an Emergency Use Authorization (EUA). This EUA will remain  in effect (meaning this test can be used) for the duration of the COVID-19 declaration under Section 564(b)(1) of the Act, 21 U.S.C.section 360bbb-3(b)(1), unless the authorization is terminated  or revoked sooner.       Influenza A by PCR NEGATIVE NEGATIVE Final   Influenza B by PCR NEGATIVE NEGATIVE Final    Comment: (NOTE) The Xpert Xpress SARS-CoV-2/FLU/RSV plus assay is intended as an aid in the diagnosis of influenza from Nasopharyngeal swab specimens and should not be used as a sole basis for treatment. Nasal washings and aspirates are unacceptable for Xpert Xpress SARS-CoV-2/FLU/RSV testing.  Fact Sheet for Patients: EntrepreneurPulse.com.au  Fact Sheet for Healthcare Providers: IncredibleEmployment.be  This test is not yet approved or cleared by the [place] FDA and has been  authorized for detection and/or diagnosis of SARS-CoV-2 by FDA under an Emergency Use Authorization (EUA). This EUA will remain in effect (meaning this test can be used) for the duration of the COVID-19 declaration under Section 564(b)(1) of the Act, 21 U.S.C. section 360bbb-3(b)(1), unless the authorization is terminated or revoked.  Performed at [facility], [address], [place], [place] [address]   Urine culture     Status: None   Collection Time: 11/27/20  9:00 PM   Specimen: Urine, Catheterized  Result Value Ref Range Status   Specimen Description   Final    URINE, CATHETERIZED Performed at [facility], [address] [place], [place] [address]    Special Requests   Final    NONE Performed at [facility], [address] [place], [place] [address]    Culture   Final    NO GROWTH Performed at [facility], [place] [address], [place], [place] [address]    Report Status 11/29/2020 FINAL  Final     Radiology Studies: CT ABDOMEN PELVIS WO CONTRAST  Result Date: 11/28/2020 CLINICAL DATA:  Acute, nonlocalized abdominal pain. Vomiting. Altered mental status EXAM: CT ABDOMEN AND PELVIS WITHOUT CONTRAST TECHNIQUE: Multidetector CT imaging of the abdomen and pelvis was performed following the standard protocol without IV contrast. COMPARISON:  None. FINDINGS: Lower chest: The visualized lung bases are clear bilaterally. Mild multi-vessel coronary artery calcification. Global cardiac size within normal limits. Hepatobiliary: There is mild intrahepatic and moderate extrahepatic biliary ductal dilation. There is probable periportal edema identified best appreciated within segment 2 and segment 7 of the liver peripherally. The gallbladder is distended and there is minimal infiltration of the pericholecystic fat which may be inflammatory, but is nonspecific given the presence of a trace perihepatic ascites. No definite focal intrahepatic mass identified. Pancreas: There is asymmetric  thickening of the head of the pancreas, not well assessed on this noncontrast examination. An underlying pancreatic mass or focal inflammatory change could appear in this fashion. Spleen: Unremarkable Adrenals/Urinary Tract: The adrenal glands are unremarkable. The kidneys are normal in size and position. Simple endophytic cortical cyst noted within the interpolar region of the left kidney. Indeterminate 14 mm cystic lesion arises from the upper pole of the right kidney, not well assessed on this noncontrast examination. The kidneys are otherwise unremarkable. The bladder is unremarkable peer Stomach/Bowel: There is omental nodularity and infiltration most in keeping with peritoneal carcinomatosis. Trace ascites noted. The stomach, small bowel,  and large bowel are otherwise unremarkable. The appendix is not clearly identified on this examination. No free intraperitoneal gas. Vascular/Lymphatic: Moderate aortoiliac atherosclerotic calcification. No aortic aneurysm. No pathologic adenopathy within the abdomen and pelvis. Reproductive: Uterus and bilateral adnexa are unremarkable. Other: No abdominal wall hernia identified. Musculoskeletal: No acute bone abnormality. Osseous structures are age-appropriate. No lytic or blastic bone lesions identified. IMPRESSION: Technically limited examination in absence of contrast administration. Asymmetric thickening of the a head of the pancreas concerning for an underlying pancreatic malignancy given the associated peritoneal nodularity and infiltration suggestive of peritoneal carcinomatosis. This is not well assessed on this examination and dedicated pancreatic protocol CT or MRI examination recommended for further evaluation. Mild to moderate intra and extrahepatic biliary ductal dilation and distension of the gallbladder. Suggestion of a peripheral periportal edema. This may reflect obstruction related to the process within the head of the pancreas. An intrinsic stricture or  inflammatory process, however, could appear similarly. Correlation with liver enzymes is recommended. Dedicated sonography may also be helpful to exclude a primary inflammatory process of the gallbladder. Indeterminate 14 mm cystic lesion within the upper pole the right kidney. This could be simultaneously assessed with MRI examination of the pancreas and liver. Aortic Atherosclerosis (ICD10-I70.0). Electronically Signed   By: Fidela Salisbury MD   On: 11/28/2020 00:19   MR ABDOMEN MRCP WO CONTRAST  Result Date: 11/29/2020 CLINICAL DATA:  RIGHT upper quadrant abdominal pain, nondiagnostic ultrasound. EXAM: MRI ABDOMEN WITHOUT CONTRAST  (INCLUDING MRCP) TECHNIQUE: Multiplanar multisequence MR imaging of the abdomen was performed. Heavily T2-weighted images of the biliary and pancreatic ducts were obtained, and three-dimensional MRCP images were rendered by post processing. COMPARISON:  Abdominal sonogram November 28, 2020. CT of the abdomen and pelvis of November 27, 2020. FINDINGS: Lower chest: Trace effusions.  No consolidation. Hepatobiliary: Marked distension of the gallbladder. Abrupt termination of the biliary tree at the level of a pancreatic mass. Sludge in the dependent gallbladder. Variable signal in the hepatic parenchyma in hepatic subsegment VIII with some segmental biliary duct dilation is nonspecific. Pericholecystic stranding and edema in the setting of ascites and peritoneal disease without significant gallbladder wall thickening. There is generalized edema throughout the abdomen. Generalized edema is noted throughout the retroperitoneum and there is small volume ascites. Pancreas: Minimal intrinsic T1 signal remaining in the pancreas in the pancreatic head. The large pancreatic mass, ill-defined and invading/encasing surrounding structures. Limited assessment due to lack of contrast. Pilar Plate in case mint of the celiac is noted. Mass tracking into hepatic duodenal ligament with peripheral ductal dilation  atrophy may measure as much as 8 x 3.4 cm. Common bile duct obstructed at the upper margin of the mass. Peripheral pancreatic duct also with dilation peripherally. Masslike area contacting the posterior stomach. Restricted diffusion within peritoneal nodularity that was seen on the previous study for instance on image 27 of series 8 a 13 mm nodule in the anterior peritoneum. Adenopathy in the region of the RIGHT diaphragmatic crus (image 17/8) 12 mm. Spleen:  Normal spleen. Adrenals/Urinary Tract:  Adrenal glands are normal. Cyst in the upper pole the RIGHT kidney and renal sinus cysts on the LEFT. No hydronephrosis. Stomach/Bowel: No acute gastrointestinal process to the extent evaluated on MR. Suspected tumor contacts posterior wall the stomach. Vascular/Lymphatic: Vascular involvement in the upper abdomen with frank involvement of the celiac and its branches best seen on image 18 and 19 of series 8. Other: Signs of ascites as seen on recent CT of the abdomen and pelvis.  Peritoneal nodularity also as outlined above. Musculoskeletal: No suspicious bone lesions identified. IMPRESSION: Positive for infiltrative pancreatic neoplasm vascular involvement, peritoneal disease and upper abdominal nodal enlargement. Biliary obstruction secondary to large mass with continued distension of the gallbladder without significant wall thickening. Lack of contrast limiting assessment. No specific findings of cholecystitis with generalized edema in the upper abdomen and ascites/peritoneal disease. Area of signal abnormality in segmental biliary duct distension potentially related to cholangitis. Underlying small hepatic lesion could lead to this appearance as well, attention on follow-up. Electronically Signed   By: Zetta Bills M.D.   On: 11/29/2020 10:15   MR 3D Recon At Scanner  Result Date: 11/29/2020 CLINICAL DATA:  RIGHT upper quadrant abdominal pain, nondiagnostic ultrasound. EXAM: MRI ABDOMEN WITHOUT CONTRAST   (INCLUDING MRCP) TECHNIQUE: Multiplanar multisequence MR imaging of the abdomen was performed. Heavily T2-weighted images of the biliary and pancreatic ducts were obtained, and three-dimensional MRCP images were rendered by post processing. COMPARISON:  Abdominal sonogram November 28, 2020. CT of the abdomen and pelvis of November 27, 2020. FINDINGS: Lower chest: Trace effusions.  No consolidation. Hepatobiliary: Marked distension of the gallbladder. Abrupt termination of the biliary tree at the level of a pancreatic mass. Sludge in the dependent gallbladder. Variable signal in the hepatic parenchyma in hepatic subsegment VIII with some segmental biliary duct dilation is nonspecific. Pericholecystic stranding and edema in the setting of ascites and peritoneal disease without significant gallbladder wall thickening. There is generalized edema throughout the abdomen. Generalized edema is noted throughout the retroperitoneum and there is small volume ascites. Pancreas: Minimal intrinsic T1 signal remaining in the pancreas in the pancreatic head. The large pancreatic mass, ill-defined and invading/encasing surrounding structures. Limited assessment due to lack of contrast. Pilar Plate in case mint of the celiac is noted. Mass tracking into hepatic duodenal ligament with peripheral ductal dilation atrophy may measure as much as 8 x 3.4 cm. Common bile duct obstructed at the upper margin of the mass. Peripheral pancreatic duct also with dilation peripherally. Masslike area contacting the posterior stomach. Restricted diffusion within peritoneal nodularity that was seen on the previous study for instance on image 27 of series 8 a 13 mm nodule in the anterior peritoneum. Adenopathy in the region of the RIGHT diaphragmatic crus (image 17/8) 12 mm. Spleen:  Normal spleen. Adrenals/Urinary Tract:  Adrenal glands are normal. Cyst in the upper pole the RIGHT kidney and renal sinus cysts on the LEFT. No hydronephrosis. Stomach/Bowel: No acute  gastrointestinal process to the extent evaluated on MR. Suspected tumor contacts posterior wall the stomach. Vascular/Lymphatic: Vascular involvement in the upper abdomen with frank involvement of the celiac and its branches best seen on image 18 and 19 of series 8. Other: Signs of ascites as seen on recent CT of the abdomen and pelvis. Peritoneal nodularity also as outlined above. Musculoskeletal: No suspicious bone lesions identified. IMPRESSION: Positive for infiltrative pancreatic neoplasm vascular involvement, peritoneal disease and upper abdominal nodal enlargement. Biliary obstruction secondary to large mass with continued distension of the gallbladder without significant wall thickening. Lack of contrast limiting assessment. No specific findings of cholecystitis with generalized edema in the upper abdomen and ascites/peritoneal disease. Area of signal abnormality in segmental biliary duct distension potentially related to cholangitis. Underlying small hepatic lesion could lead to this appearance as well, attention on follow-up. Electronically Signed   By: Zetta Bills M.D.   On: 11/29/2020 10:15   DG Chest Portable 1 View  Result Date: 11/27/2020 CLINICAL DATA:  Status post fall. EXAM:  PORTABLE CHEST 1 VIEW COMPARISON:  May 11, 2017 FINDINGS: Chronic appearing increased lung markings are seen without evidence of acute infiltrate, pleural effusion or pneumothorax. Stable linear scarring is seen within the right lung base. The heart size and mediastinal contours are within normal limits. Marked severity calcification of the thoracic aorta is noted. Degenerative changes seen throughout the thoracic spine. IMPRESSION: Stable exam without acute or active cardiopulmonary disease. Electronically Signed   By: Virgina Norfolk M.D.   On: 11/27/2020 20:37   US Abdomen Limited RUQ (LIVER/GB)  Result Date: 11/28/2020 CLINICAL DATA:  Abnormal CT.  Follow-up exam. EXAM: ULTRASOUND ABDOMEN LIMITED RIGHT  UPPER QUADRANT COMPARISON:  CT, 11/27/2020. FINDINGS: Gallbladder: Gallbladder is distended. No shadowing stones. No gallbladder wall thickening or pericholecystic fluid. Common bile duct: Diameter: Dilated to 1.1 cm, mid to distal portion. No visualized stone. Liver: Heterogeneous echogenicity. Normal size. No mass or focal lesion. Portal vein is patent on color Doppler imaging with normal direction of blood flow towards the liver. Other: None. IMPRESSION: 1. Dilated mid to distal common bile duct without evidence of a duct stone. CT findings suspicious for a pancreatic head mass. This would be best assessed with pancreatic MRI without and with contrast, alternatively pancreatic protocol CT with contrast. 2. Heterogeneous echogenicity of the liver without a defined mass. 3. Distended gallbladder, but no stones or evidence of acute cholecystitis. Electronically Signed   By: Lajean Manes M.D.   On: 11/28/2020 10:19    Scheduled Meds:  bisoprolol  5 mg Oral Daily   gabapentin  300 mg Oral QHS   levothyroxine  50 mcg Oral Daily   pantoprazole  40 mg Oral Daily   polyethylene glycol  17 g Oral Daily   pravastatin  40 mg Oral Daily   senna-docusate  1 tablet Oral QHS   Continuous Infusions:  piperacillin-tazobactam 3.375 g (11/29/20 1203)    LOS: 1 day   Time spent: 35 mins   Darlene Brozowski Wynetta Emery, MD How to contact the [facility] Attending or Consulting provider [place] or covering provider during after hours [place], for this patient?  Check the care team in [facility] and look for a) attending/consulting [facility] provider listed and b) the [facility] team listed Log into [email or web address] and use [employer]'s universal password to access. If you do not have the password, please contact the hospital operator. Locate the [facility] provider you are looking for under [facility] and page to a number that you can be directly reached. If you still have difficulty reaching the provider, please page the [facility] (Director on Call) for the  Hospitalists listed on amion for assistance.  11/29/2020, 2:40 PM  '

## 2020-11-30 ENCOUNTER — Encounter (HOSPITAL_COMMUNITY): Payer: Self-pay | Admitting: Family Medicine

## 2020-11-30 ENCOUNTER — Inpatient Hospital Stay (HOSPITAL_COMMUNITY): Payer: Medicare Other

## 2020-11-30 ENCOUNTER — Inpatient Hospital Stay (HOSPITAL_COMMUNITY): Payer: Medicare Other | Admitting: Anesthesiology

## 2020-11-30 ENCOUNTER — Encounter (HOSPITAL_COMMUNITY)
Admission: EM | Disposition: A | Discharge status: Hospice | Payer: Self-pay | Source: Home / Self Care | Attending: Family Medicine

## 2020-11-30 DIAGNOSIS — K831 Obstruction of bile duct: Secondary | ICD-10-CM

## 2020-11-30 DIAGNOSIS — Z515 Encounter for palliative care: Secondary | ICD-10-CM

## 2020-11-30 DIAGNOSIS — K838 Other specified diseases of biliary tract: Secondary | ICD-10-CM

## 2020-11-30 DIAGNOSIS — Z7189 Other specified counseling: Secondary | ICD-10-CM

## 2020-11-30 HISTORY — PX: ERCP: SHX5425

## 2020-11-30 HISTORY — PX: BILIARY STENT PLACEMENT: SHX5538

## 2020-11-30 HISTORY — PX: SPHINCTEROTOMY: SHX5544

## 2020-11-30 LAB — COMPREHENSIVE METABOLIC PANEL
ALT: 47 U/L — ABNORMAL HIGH (ref 0–44)
AST: 51 U/L — ABNORMAL HIGH (ref 15–41)
Albumin: 2.8 g/dL — ABNORMAL LOW (ref 3.5–5.0)
Alkaline Phosphatase: 148 U/L — ABNORMAL HIGH (ref 38–126)
Anion gap: 6 (ref 5–15)
BUN: 14 mg/dL (ref 8–23)
CO2: 26 mmol/L (ref 22–32)
Calcium: 8.5 mg/dL — ABNORMAL LOW (ref 8.9–10.3)
Chloride: 106 mmol/L (ref 98–111)
Creatinine, Ser: 1.16 mg/dL — ABNORMAL HIGH (ref 0.44–1.00)
GFR, Estimated: 43 mL/min — ABNORMAL LOW (ref >60)
Glucose, Bld: 106 mg/dL — ABNORMAL HIGH (ref 70–99)
Potassium: 3.3 mmol/L — ABNORMAL LOW (ref 3.5–5.1)
Sodium: 138 mmol/L (ref 135–145)
Total Bilirubin: 0.9 mg/dL (ref 0.3–1.2)
Total Protein: 5.4 g/dL — ABNORMAL LOW (ref 6.5–8.1)

## 2020-11-30 LAB — CBC
HCT: 34.7 % — ABNORMAL LOW (ref 36.0–46.0)
Hemoglobin: 11.5 g/dL — ABNORMAL LOW (ref 12.0–15.0)
MCH: 33.2 pg (ref 26.0–34.0)
MCHC: 33.1 g/dL (ref 30.0–36.0)
MCV: 100.3 fL — ABNORMAL HIGH (ref 80.0–100.0)
Platelets: 243 10*3/uL (ref 150–400)
RBC: 3.46 MIL/uL — ABNORMAL LOW (ref 3.87–5.11)
RDW: 14 % (ref 11.5–15.5)
WBC: 6.1 10*3/uL (ref 4.0–10.5)
nRBC: 0 % (ref 0.0–0.2)

## 2020-11-30 LAB — MAGNESIUM: Magnesium: 2.3 mg/dL (ref 1.7–2.4)

## 2020-11-30 SURGERY — ERCP, WITH INTERVENTION IF INDICATED
Anesthesia: General

## 2020-11-30 MED ORDER — FENTANYL CITRATE (PF) 100 MCG/2ML IJ SOLN
INTRAMUSCULAR | Status: DC | PRN
  Administered 2020-11-30 (×4): 25 ug via INTRAVENOUS

## 2020-11-30 MED ORDER — PROPOFOL 10 MG/ML IV BOLUS
INTRAVENOUS | Status: DC | PRN
  Administered 2020-11-30: 60 mg via INTRAVENOUS
  Administered 2020-11-30: 80 mg via INTRAVENOUS

## 2020-11-30 MED ORDER — CHLORHEXIDINE GLUCONATE 0.12 % MT SOLN
15.0000 mL | Freq: Once | OROMUCOSAL | Status: AC
  Administered 2020-11-30: 15 mL via OROMUCOSAL

## 2020-11-30 MED ORDER — LIDOCAINE HCL (CARDIAC) PF 100 MG/5ML IV SOSY
PREFILLED_SYRINGE | INTRAVENOUS | Status: DC | PRN
  Administered 2020-11-30 (×2): 40 mg via INTRAVENOUS

## 2020-11-30 MED ORDER — SODIUM CHLORIDE 0.9 % IV SOLN
INTRAVENOUS | Status: AC
  Filled 2020-11-30: qty 50

## 2020-11-30 MED ORDER — ONDANSETRON HCL 4 MG/2ML IJ SOLN
INTRAMUSCULAR | Status: DC | PRN
  Administered 2020-11-30: 4 mg via INTRAVENOUS

## 2020-11-30 MED ORDER — PHENYLEPHRINE HCL (PRESSORS) 10 MG/ML IV SOLN
INTRAVENOUS | Status: DC | PRN
  Administered 2020-11-30: 80 ug via INTRAVENOUS

## 2020-11-30 MED ORDER — ROCURONIUM BROMIDE 10 MG/ML (PF) SYRINGE
PREFILLED_SYRINGE | INTRAVENOUS | Status: DC | PRN
  Administered 2020-11-30: 20 mg via INTRAVENOUS

## 2020-11-30 MED ORDER — GLUCAGON HCL RDNA (DIAGNOSTIC) 1 MG IJ SOLR
INTRAMUSCULAR | Status: DC | PRN
  Administered 2020-11-30 (×2): .25 mg via INTRAVENOUS

## 2020-11-30 MED ORDER — LABETALOL HCL 5 MG/ML IV SOLN
INTRAVENOUS | Status: DC | PRN
  Administered 2020-11-30 (×3): 5 mg via INTRAVENOUS

## 2020-11-30 MED ORDER — DEXAMETHASONE SODIUM PHOSPHATE 4 MG/ML IJ SOLN
4.0000 mg | Freq: Once | INTRAMUSCULAR | Status: AC
  Administered 2020-11-30: 4 mg via INTRAVENOUS

## 2020-11-30 MED ORDER — ORAL CARE MOUTH RINSE: 15.0000 mL | Freq: Once | OROMUCOSAL | Status: AC

## 2020-11-30 MED ORDER — SUGAMMADEX SODIUM 200 MG/2ML IV SOLN
INTRAVENOUS | Status: DC | PRN
  Administered 2020-11-30: 150 mg via INTRAVENOUS

## 2020-11-30 MED ORDER — SODIUM CHLORIDE 0.9 % IV SOLN
INTRAVENOUS | Status: DC | PRN
  Administered 2020-11-30: 15 mL

## 2020-11-30 MED ORDER — POTASSIUM CHLORIDE 10 MEQ/100ML IV SOLN
10.0000 meq | INTRAVENOUS | Status: AC
  Administered 2020-11-30 (×3): 10 meq via INTRAVENOUS
  Filled 2020-11-30 (×3): qty 100

## 2020-11-30 MED ORDER — FENTANYL CITRATE (PF) 100 MCG/2ML IJ SOLN: 25.0000 ug | INTRAMUSCULAR | Status: DC | PRN

## 2020-11-30 MED ORDER — DEXAMETHASONE SODIUM PHOSPHATE 4 MG/ML IJ SOLN
INTRAMUSCULAR | Status: AC
  Filled 2020-11-30: qty 1

## 2020-11-30 MED ORDER — GLUCAGON HCL RDNA (DIAGNOSTIC) 1 MG IJ SOLR
INTRAMUSCULAR | Status: AC
  Filled 2020-11-30: qty 1

## 2020-11-30 MED ORDER — SUCCINYLCHOLINE CHLORIDE 200 MG/10ML IV SOSY
PREFILLED_SYRINGE | INTRAVENOUS | Status: AC
  Filled 2020-11-30: qty 10

## 2020-11-30 MED ORDER — LIDOCAINE 5 % EX PTCH
1.0000 | MEDICATED_PATCH | CUTANEOUS | Status: DC
  Administered 2020-11-30: 1 via TRANSDERMAL
  Filled 2020-11-30: qty 1

## 2020-11-30 MED ORDER — STERILE WATER FOR IRRIGATION IR SOLN
Status: DC | PRN
  Administered 2020-11-30: 1000 mL

## 2020-11-30 MED ORDER — LACTATED RINGERS IV SOLN
INTRAVENOUS | Status: DC
  Administered 2020-11-30: 1000 mL via INTRAVENOUS

## 2020-11-30 MED ORDER — ONDANSETRON HCL 4 MG/2ML IJ SOLN
INTRAMUSCULAR | Status: AC
  Filled 2020-11-30: qty 2

## 2020-11-30 MED ORDER — SODIUM CHLORIDE 0.9 % IV SOLN: INTRAVENOUS | Status: DC

## 2020-11-30 MED ORDER — ONDANSETRON HCL 4 MG/2ML IJ SOLN
4.0000 mg | Freq: Once | INTRAMUSCULAR | Status: AC
  Administered 2020-11-30: 4 mg via INTRAVENOUS

## 2020-11-30 MED ORDER — LIDOCAINE HCL (PF) 2 % IJ SOLN
INTRAMUSCULAR | Status: AC
  Filled 2020-11-30: qty 5

## 2020-11-30 MED ORDER — FENTANYL CITRATE (PF) 100 MCG/2ML IJ SOLN
INTRAMUSCULAR | Status: AC
  Filled 2020-11-30: qty 2

## 2020-11-30 MED ORDER — SUCCINYLCHOLINE CHLORIDE 20 MG/ML IJ SOLN
INTRAMUSCULAR | Status: DC | PRN
  Administered 2020-11-30: 100 mg via INTRAVENOUS

## 2020-11-30 MED ORDER — PROPOFOL 10 MG/ML IV BOLUS
INTRAVENOUS | Status: AC
  Filled 2020-11-30: qty 40

## 2020-11-30 MED ORDER — EPHEDRINE SULFATE 50 MG/ML IJ SOLN
INTRAMUSCULAR | Status: DC | PRN
  Administered 2020-11-30 (×2): 5 mg via INTRAVENOUS

## 2020-11-30 SURGICAL SUPPLY — 26 items
BALLN RETRIEVAL 12X15 (BALLOONS) IMPLANT
BALLN RETRIEVAL 12X15MM (BALLOONS)
BALN RTRVL 200 6-7FR 12-15 (BALLOONS)
BASKET TRAPEZOID 3X6 (MISCELLANEOUS) IMPLANT
BASKET TRAPEZOID LITHO 2.0X5 (MISCELLANEOUS) ×2 IMPLANT
BSKT STON RTRVL TRAPEZOID 2X5 (MISCELLANEOUS)
BSKT STON RTRVL TRAPEZOID 3X6 (MISCELLANEOUS)
DEVICE INFLATION ENCORE 26 (MISCELLANEOUS) IMPLANT
DEVICE LOCKING W-BIOPSY CAP (MISCELLANEOUS) ×2 IMPLANT
GUIDEWIRE HYDRA JAGWIRE .35 (WIRE) IMPLANT
GUIDEWIRE JAG HINI 025X260CM (WIRE) IMPLANT
KIT ENDO PROCEDURE PEN (KITS) ×4 IMPLANT
KIT TURNOVER KIT A (KITS) ×4 IMPLANT
LUBRICANT JELLY 4.5OZ STERILE (MISCELLANEOUS) IMPLANT
PAD ARMBOARD 7.5X6 YLW CONV (MISCELLANEOUS) ×4 IMPLANT
POSITIONER HEAD 8X9X4 ADT (SOFTGOODS) IMPLANT
SCOPE SPY DS DISPOSABLE (MISCELLANEOUS) ×2 IMPLANT
SNARE ROTATE MED OVAL 20MM (MISCELLANEOUS) IMPLANT
SNARE SHORT THROW 13M SML OVAL (MISCELLANEOUS) IMPLANT
SPHINCTEROTOME AUTOTOME .25 (MISCELLANEOUS) IMPLANT
SPHINCTEROTOME HYDRATOME 44 (MISCELLANEOUS) ×2 IMPLANT
SYSTEM CONTINUOUS INJECTION (MISCELLANEOUS) ×2 IMPLANT
TUBING INSUFFLATOR CO2MPACT (TUBING) ×2 IMPLANT
WALLSTENT METAL COVERED 10X60 (STENTS) IMPLANT
WALLSTENT METAL COVERED 10X80 (STENTS) IMPLANT
WATER STERILE IRR 1000ML POUR (IV SOLUTION) ×4 IMPLANT

## 2020-11-30 NOTE — Progress Notes (Signed)
PROGRESS NOTE   Katelyn Daniel  YWV:[id number] DOB: 06-16-1923 DOA: 11/27/2020 PCP: Lucia Gaskins, MD   Chief Complaint  Patient presents with   Fall    Fever    Level of care: Med-Surg  Brief Admission History:   87 y.o. female, with history of Combined CHF, CKD, HLD, HTN, thyroid disease and more presents to the ED with a chief complaint of nausea and vomiting. She reports that it started yesterday.She reports that was having episodes of non-bloody emesis q15 mins.  She reports associated generalized weakness, and today she slumped onto the floor and was unable to get up.  She reports that she did not fall she had been sitting on the couch and then she slumped down to the floor.  She reports a pressure like pain in her abdomen that she thinks was more concentrated in her lower abdomen.  She reports some diarrhea that she thinks was nonbloody.  She reports that she has macular degeneration so her vision is not good and she cannot be sure that there was no blood.  She had associated fever and chills.  She reports chronic abdominal pain and back pain from shingles.  3 days ago she did have chest pain directly under her life alert necklace.  She reports the pain was resolved with taking off the necklace.  She denies any dyspnea or palpitations.  She denies any dysuria.  She is oriented x3.  Patient has no other complaints at this time.  Assessment & Plan:   Principal Problem:   Pancreatic mass Active Problems:   Hypothyroidism   Intractable nausea and vomiting   SIRS (systemic inflammatory response syndrome) (HCC)   Hypokalemia   Abdominal pain   Fever  Suspected pancreatic cancer with metastasis - Pt has findings of biliary obstruction and GI consult requested and they are recommending palliative biliary stent placement pending results of MRI.  I had a long discussion with patient and she is aware that the prognosis is grave. She says she would not want chemotherapy or surgery and she  is agreeable with palliative care. She is deciding if she wants to go home with palliative services or to pursue residential hospice placement in [place].  GI is discussing with Dr. Laural Golden regarding palliative biliary stent placement.  ERCP PLANNED FOR LATER TODAY WITH DR [facility] FOR BILIARY STENTING.   Hypothyroidism - resumed home levothyroxine daily.    Essential hypertension - resumed home medications.   Intractable nausea and vomiting - she is improving with supportive care.    DNR present on admission - continue DNR order per wishes in hospital.    DVT prophylaxis: SQ heparin/SCD Code Status: DNR Family Communication: son by telephone  Disposition: TBD  Status is: Inpatient  Remains inpatient appropriate because:Ongoing active pain requiring inpatient pain management, IV treatments appropriate due to intensity of illness or inability to take PO, and Inpatient level of care appropriate due to severity of illness  Dispo: The patient is from: Home              Anticipated d/c is to:  TBD              Patient currently is not medically stable to d/c.   Difficult to place patient No   Consultants:  GI Palliative care   Procedures:  MRI pending   Antimicrobials:  Zosyn IV 6/26>>   Subjective: Pt says she has some abdominal pain but tolerable, she is hungry and wants to eat solid food.  Objective: Vitals:   11/28/20 2030 11/29/20 0514 11/29/20 1348 11/29/20 2142  BP: (!) 155/63 (!) 172/80 138/65 (!) 155/65  Pulse: (!) 58 67 63 (!) 54  Resp: 16 18    Temp: 98.8 F (37.1 C) 98.8 F (37.1 C) 98.1 F (36.7 C) 98.4 F (36.9 C)  TempSrc:   Oral Oral  SpO2: 95% 95% 96% 96%  Weight:      Height:        Intake/Output Summary (Last 24 hours) at 11/30/2020 1211 Last data filed at 11/29/2020 2026 Gross per 24 hour  Intake 480 ml  Output 500 ml  Net -20 ml   Filed Weights   11/27/20 1748 11/28/20 1535  Weight: 59 kg 60.6 kg    Examination:  General exam: elderly  pleasant female, awake, alert, Appears calm and comfortable  Respiratory system: Clear to auscultation. Respiratory effort normal. Cardiovascular system: normal S1 & S2 heard. No JVD, murmurs, rubs, gallops or clicks. No pedal edema. Gastrointestinal system: Abdomen is nondistended, soft and mild epigastric tenderness. No organomegaly or masses felt. Normal bowel sounds heard. Central nervous system: Alert and oriented. No focal neurological deficits. Extremities: Symmetric 5 x 5 power. Skin: No rashes, lesions or ulcers Psychiatry: Judgement and insight appear normal. Mood & affect appropriate.   Data Reviewed: I have personally reviewed following labs and imaging studies  CBC: Recent Labs  Lab 11/27/20 1905 11/28/20 0519 11/29/20 0555 11/30/20 0532  WBC 12.0* 10.7* 7.8 6.1  NEUTROABS 10.7*  --   --   --   HGB 13.2 11.6* 11.9* 11.5*  HCT 39.2 34.4* 35.8* 34.7*  MCV 100.3* 102.4* 102.3* 100.3*  PLT 254 242 251 194    Basic Metabolic Panel: Recent Labs  Lab 11/27/20 1905 11/28/20 0519 11/29/20 0555 11/30/20 0532  NA 135 136 136 138  K 3.2* 4.0 3.6 3.3*  CL 100 102 101 106  CO2 25 27 27 26   GLUCOSE 195* 135* 114* 106*  BUN 19 19 17 14   CREATININE 1.17* 1.18* 1.20* 1.16*  CALCIUM 8.9 8.5* 8.4* 8.5*  MG  --   --  2.3 2.3    GFR: Estimated Creatinine Clearance: 27.8 mL/min (A) (by C-G formula based on SCr of 1.16 mg/dL (H)).  Liver Function Tests: Recent Labs  Lab 11/27/20 1905 11/28/20 0519 11/29/20 0555 11/30/20 0532  AST 41 39 52* 51*  ALT 34 33 43 47*  ALKPHOS 141* 112 127* 148*  BILITOT 1.0 0.6 0.7 0.9  PROT 6.1* 5.4* 5.5* 5.4*  ALBUMIN 3.6 2.9* 2.8* 2.8*    CBG: No results for input(s): GLUCAP in the last 168 hours.  Recent Results (from the past 240 hour(s))  Resp Panel by RT-PCR (Flu A&B, Covid) Nasopharyngeal Swab     Status: None   Collection Time: 11/27/20  7:20 PM   Specimen: Nasopharyngeal Swab; Nasopharyngeal(NP) swabs in vial transport  medium  Result Value Ref Range Status   SARS Coronavirus 2 by RT PCR NEGATIVE NEGATIVE Final    Comment: (NOTE) SARS-CoV-2 target nucleic acids are NOT DETECTED.  The SARS-CoV-2 RNA is generally detectable in upper respiratory specimens during the acute phase of infection. The lowest concentration of SARS-CoV-2 viral copies this assay can detect is 138 copies/mL. A negative result does not preclude SARS-Cov-2 infection and should not be used as the sole basis for treatment or other patient management decisions. A negative result may occur with  improper specimen collection/handling, submission of specimen other than nasopharyngeal swab, presence of viral mutation(s)  within the areas targeted by this assay, and inadequate number of viral copies(<138 copies/mL). A negative result must be combined with clinical observations, patient history, and epidemiological information. The expected result is Negative.  Fact Sheet for Patients:  EntrepreneurPulse.com.au  Fact Sheet for Healthcare Providers:  IncredibleEmployment.be  This test is no t yet approved or cleared by the [place] FDA and  has been authorized for detection and/or diagnosis of SARS-CoV-2 by FDA under an Emergency Use Authorization (EUA). This EUA will remain  in effect (meaning this test can be used) for the duration of the COVID-19 declaration under Section 564(b)(1) of the Act, 21 U.S.C.section 360bbb-3(b)(1), unless the authorization is terminated  or revoked sooner.       Influenza A by PCR NEGATIVE NEGATIVE Final   Influenza B by PCR NEGATIVE NEGATIVE Final    Comment: (NOTE) The Xpert Xpress SARS-CoV-2/FLU/RSV plus assay is intended as an aid in the diagnosis of influenza from Nasopharyngeal swab specimens and should not be used as a sole basis for treatment. Nasal washings and aspirates are unacceptable for Xpert Xpress SARS-CoV-2/FLU/RSV testing.  Fact Sheet for  Patients: EntrepreneurPulse.com.au  Fact Sheet for Healthcare Providers: IncredibleEmployment.be  This test is not yet approved or cleared by the [place] FDA and has been authorized for detection and/or diagnosis of SARS-CoV-2 by FDA under an Emergency Use Authorization (EUA). This EUA will remain in effect (meaning this test can be used) for the duration of the COVID-19 declaration under Section 564(b)(1) of the Act, 21 U.S.C. section 360bbb-3(b)(1), unless the authorization is terminated or revoked.  Performed at [facility], [address], [place], [place] [address]   Urine culture     Status: None   Collection Time: 11/27/20  9:00 PM   Specimen: Urine, Catheterized  Result Value Ref Range Status   Specimen Description   Final    URINE, CATHETERIZED Performed at [facility], [address] [place], [place] [address]    Special Requests   Final    NONE Performed at [facility], [address], [place], [place] [address]    Culture   Final    NO GROWTH Performed at [facility], [place] [address] [place], [place] [address]    Report Status 11/29/2020 FINAL  Final     Radiology Studies: MR ABDOMEN MRCP WO CONTRAST  Result Date: 11/29/2020 CLINICAL DATA:  RIGHT upper quadrant abdominal pain, nondiagnostic ultrasound. EXAM: MRI ABDOMEN WITHOUT CONTRAST  (INCLUDING MRCP) TECHNIQUE: Multiplanar multisequence MR imaging of the abdomen was performed. Heavily T2-weighted images of the biliary and pancreatic ducts were obtained, and three-dimensional MRCP images were rendered by post processing. COMPARISON:  Abdominal sonogram November 28, 2020. CT of the abdomen and pelvis of November 27, 2020. FINDINGS: Lower chest: Trace effusions.  No consolidation. Hepatobiliary: Marked distension of the gallbladder. Abrupt termination of the biliary tree at the level of a pancreatic mass. Sludge in the dependent gallbladder. Variable signal in the hepatic  parenchyma in hepatic subsegment VIII with some segmental biliary duct dilation is nonspecific. Pericholecystic stranding and edema in the setting of ascites and peritoneal disease without significant gallbladder wall thickening. There is generalized edema throughout the abdomen. Generalized edema is noted throughout the retroperitoneum and there is small volume ascites. Pancreas: Minimal intrinsic T1 signal remaining in the pancreas in the pancreatic head. The large pancreatic mass, ill-defined and invading/encasing surrounding structures. Limited assessment due to lack of contrast. Pilar Plate in case mint of the celiac is noted. Mass tracking  into hepatic duodenal ligament with peripheral ductal dilation atrophy may measure as much as 8 x 3.4 cm. Common bile duct obstructed at the upper margin of the mass. Peripheral pancreatic duct also with dilation peripherally. Masslike area contacting the posterior stomach. Restricted diffusion within peritoneal nodularity that was seen on the previous study for instance on image 27 of series 8 a 13 mm nodule in the anterior peritoneum. Adenopathy in the region of the RIGHT diaphragmatic crus (image 17/8) 12 mm. Spleen:  Normal spleen. Adrenals/Urinary Tract:  Adrenal glands are normal. Cyst in the upper pole the RIGHT kidney and renal sinus cysts on the LEFT. No hydronephrosis. Stomach/Bowel: No acute gastrointestinal process to the extent evaluated on MR. Suspected tumor contacts posterior wall the stomach. Vascular/Lymphatic: Vascular involvement in the upper abdomen with frank involvement of the celiac and its branches best seen on image 18 and 19 of series 8. Other: Signs of ascites as seen on recent CT of the abdomen and pelvis. Peritoneal nodularity also as outlined above. Musculoskeletal: No suspicious bone lesions identified. IMPRESSION: Positive for infiltrative pancreatic neoplasm vascular involvement, peritoneal disease and upper abdominal nodal enlargement. Biliary  obstruction secondary to large mass with continued distension of the gallbladder without significant wall thickening. Lack of contrast limiting assessment. No specific findings of cholecystitis with generalized edema in the upper abdomen and ascites/peritoneal disease. Area of signal abnormality in segmental biliary duct distension potentially related to cholangitis. Underlying small hepatic lesion could lead to this appearance as well, attention on follow-up. Electronically Signed   By: Zetta Bills M.D.   On: 11/29/2020 10:15   MR 3D Recon At Scanner  Result Date: 11/29/2020 CLINICAL DATA:  RIGHT upper quadrant abdominal pain, nondiagnostic ultrasound. EXAM: MRI ABDOMEN WITHOUT CONTRAST  (INCLUDING MRCP) TECHNIQUE: Multiplanar multisequence MR imaging of the abdomen was performed. Heavily T2-weighted images of the biliary and pancreatic ducts were obtained, and three-dimensional MRCP images were rendered by post processing. COMPARISON:  Abdominal sonogram November 28, 2020. CT of the abdomen and pelvis of November 27, 2020. FINDINGS: Lower chest: Trace effusions.  No consolidation. Hepatobiliary: Marked distension of the gallbladder. Abrupt termination of the biliary tree at the level of a pancreatic mass. Sludge in the dependent gallbladder. Variable signal in the hepatic parenchyma in hepatic subsegment VIII with some segmental biliary duct dilation is nonspecific. Pericholecystic stranding and edema in the setting of ascites and peritoneal disease without significant gallbladder wall thickening. There is generalized edema throughout the abdomen. Generalized edema is noted throughout the retroperitoneum and there is small volume ascites. Pancreas: Minimal intrinsic T1 signal remaining in the pancreas in the pancreatic head. The large pancreatic mass, ill-defined and invading/encasing surrounding structures. Limited assessment due to lack of contrast. Pilar Plate in case mint of the celiac is noted. Mass tracking into  hepatic duodenal ligament with peripheral ductal dilation atrophy may measure as much as 8 x 3.4 cm. Common bile duct obstructed at the upper margin of the mass. Peripheral pancreatic duct also with dilation peripherally. Masslike area contacting the posterior stomach. Restricted diffusion within peritoneal nodularity that was seen on the previous study for instance on image 27 of series 8 a 13 mm nodule in the anterior peritoneum. Adenopathy in the region of the RIGHT diaphragmatic crus (image 17/8) 12 mm. Spleen:  Normal spleen. Adrenals/Urinary Tract:  Adrenal glands are normal. Cyst in the upper pole the RIGHT kidney and renal sinus cysts on the LEFT. No hydronephrosis. Stomach/Bowel: No acute gastrointestinal process to the extent evaluated on MR.  Suspected tumor contacts posterior wall the stomach. Vascular/Lymphatic: Vascular involvement in the upper abdomen with frank involvement of the celiac and its branches best seen on image 18 and 19 of series 8. Other: Signs of ascites as seen on recent CT of the abdomen and pelvis. Peritoneal nodularity also as outlined above. Musculoskeletal: No suspicious bone lesions identified. IMPRESSION: Positive for infiltrative pancreatic neoplasm vascular involvement, peritoneal disease and upper abdominal nodal enlargement. Biliary obstruction secondary to large mass with continued distension of the gallbladder without significant wall thickening. Lack of contrast limiting assessment. No specific findings of cholecystitis with generalized edema in the upper abdomen and ascites/peritoneal disease. Area of signal abnormality in segmental biliary duct distension potentially related to cholangitis. Underlying small hepatic lesion could lead to this appearance as well, attention on follow-up. Electronically Signed   By: Zetta Bills M.D.   On: 11/29/2020 10:15    Scheduled Meds:  bisoprolol  5 mg Oral Daily   gabapentin  300 mg Oral QHS   levothyroxine  50 mcg Oral Daily    pantoprazole  40 mg Oral Daily   polyethylene glycol  17 g Oral Daily   pravastatin  40 mg Oral Daily   senna-docusate  1 tablet Oral QHS   Continuous Infusions:  piperacillin-tazobactam 3.375 g (11/30/20 0443)    LOS: 2 days   Time spent: 35 mins   Kamika Goodloe Wynetta Emery, MD How to contact the [facility] Attending or Consulting provider [place] or covering provider during after hours [place], for this patient?  Check the care team in [facility] and look for a) attending/consulting [facility] provider listed and b) the [facility] team listed Log into [email or web address] and use [place] universal password to access. If you do not have the password, please contact the hospital operator. Locate the [facility] provider you are looking for under [facility] and page to a number that you can be directly reached. If you still have difficulty reaching the provider, please page the [facility] (Director on Call) for the Hospitalists listed on amion for assistance.  11/30/2020, 12:11 PM  '

## 2020-11-30 NOTE — Progress Notes (Signed)
Brief ERCP note.  Antral antral gastritis and duodenitis with duodenal stricture dilated with balloon dilated to 15 mm. Normal ampulla of water. Distal CBD stricture with dilation of biliary system upstream and patent cystic duct. 5 mm sphincterotomy performed. Brushing cytology obtained of bile duct stricture. 10 mm x 60 mm fully covered Wallstent placed for biliary decompression. Patient tolerated the procedure well.

## 2020-11-30 NOTE — Transfer of Care (Signed)
Immediate Anesthesia Transfer of Care Note  Patient: Katelyn Daniel  Procedure(s) Performed: ENDOSCOPIC RETROGRADE CHOLANGIOPANCREATOGRAPHY (ERCP) BILIARY STENT PLACEMENT SPHINCTEROTOMY with duodenal stricture dilation  Patient Location: PACU  Anesthesia Type:General  Level of Consciousness: drowsy  Airway & Oxygen Therapy: Patient Spontanous Breathing and Patient connected to nasal cannula oxygen  Post-op Assessment: Report given to RN and Post -op Vital signs reviewed and stable  Post vital signs: Reviewed and stable  Last Vitals:  Vitals Value Taken Time  BP 144/75 11/30/20 1519  Temp    Pulse 67 11/30/20 1521  Resp 22 11/30/20 1521  SpO2 97 % 11/30/20 1521  Vitals shown include unvalidated device data.  Last Pain:  Vitals:   11/30/20 1328  TempSrc: Oral  PainSc: 0-No pain      Patients Stated Pain Goal: 6 (76/54/65 0354)  Complications: No notable events documented.

## 2020-11-30 NOTE — Anesthesia Procedure Notes (Signed)
Procedure Name: Intubation Date/Time: 11/30/2020 2:04 PM Performed by: Vista Deck, CRNA Pre-anesthesia Checklist: Patient identified, Emergency Drugs available, Suction available, Patient being monitored and Timeout performed Patient Re-evaluated:Patient Re-evaluated prior to induction Oxygen Delivery Method: Circle system utilized Preoxygenation: Pre-oxygenation with 100% oxygen Induction Type: IV induction Laryngoscope Size: Glidescope and 3 Grade View: Grade I Tube type: Oral Tube size: 7.0 mm Number of attempts: 1 Airway Equipment and Method: Video-laryngoscopy and Stylet Placement Confirmation: ETT inserted through vocal cords under direct vision, positive ETCO2 and breath sounds checked- equal and bilateral Secured at: 22 cm Tube secured with: Tape Dental Injury: Teeth and Oropharynx as per pre-operative assessment

## 2020-11-30 NOTE — Op Note (Signed)
[facility] Patient Name: Katelyn Daniel Procedure Date: 11/30/2020 1:30 PM MRN: [id number] Date of Birth: Aug 27, 1923 Attending MD: Hildred Laser , MD CSN: [id number] Age: 87 Admit Type: Inpatient Procedure:                ERCP with biliary stenting. EGD with duodenal                            stricture dilation Indications:              Malignant tumor of the head of pancreas Providers:                Hildred Laser, MD, Charlsie Quest. Theda Sers RN, RN,                            Randa Spike, Technician Referring MD:             Irwin Brakeman, MD Medicines:                General Anesthesia Complications:            No immediate complications. Estimated Blood Loss:     Estimated blood loss: none. Estimated blood loss                            was minimal. Procedure:                Pre-Anesthesia Assessment:                           - Prior to the procedure, a History and Physical                            was performed, and patient medications and                            allergies were reviewed. The patient's tolerance of                            previous anesthesia was also reviewed. The risks                            and benefits of the procedure and the sedation                            options and risks were discussed with the patient.                            All questions were answered, and informed consent                            was obtained. Prior Anticoagulants: The patient has                            taken no previous anticoagulant or antiplatelet  agents. ASA Grade Assessment: III - A patient with                            severe systemic disease. After reviewing the risks                            and benefits, the patient was deemed in                            satisfactory condition to undergo the procedure.                           After obtaining informed consent, the scope was                            passed  under direct vision. Throughout the                            procedure, the patient's blood pressure, pulse, and                            oxygen saturations were monitored continuously. The                            [employer] single use                            duodenoscope was introduced through the mouth, and                            used to inject contrast into and used to locate the                            major papilla. The ERCP was accomplished without                            difficulty. The patient tolerated the procedure                            well. Scope In: 2:19:27 PM Scope Out: 3:00:44 PM Total Procedure Duration: 0 hours 41 minutes 17 seconds  Findings:      The scout film was normal. The scope was passed under direct vision       through the upper GI tract. Localized mild inflammation was found in the       prepyloric region of the stomach. An acquired benign-appearing,       intrinsic moderate stenosis was found in the duodenal bulb and was       traversed only after dilation with a balloon dilated to 15 mm using       Q-scope.. A TTS dilator was passed through the scope. Dilation with a 12       mm, a 13.5 mm and a 15 mm balloon dilator was performed in the duodenum.       After duodenal dilation Q-scope exchanged with duodenoscope  and       examination completed. The major papilla was normal. A 0.035 inch x 260       cm straight Hydra Jagwire was passed into the ventral pancreatic duct       and left in place until bile duct was deeply cannulated the bile duct       was deeply cannulated with the Hydratome sphincterotome. This guidewire       was then removed. Contrast was injected. I personally interpreted the       bile duct images. There was brisk flow of contrast through the ducts.       Image quality was excellent. Contrast extended to the entire biliary       tree. The lower third of the main bile duct contained a single  moderate       stenosis. A 5 mm biliary sphincterotomy was made with a monofilament       Hydratome sphincterotome using ERBE electrocautery. The sphincterotomy       oozed blood. Cells for cytology were obtained by brushing in the lower       third of the main bile duct. One 10 mm by 6 cm covered metal stent was       placed 5.5 cm into the common bile duct. Fluid and bile flowed through       the stent. The stent was in good position. Impression:               - Gastroduodenitis with duodenal stricture.                           - Dilation performed in the duodenum.                           - The major papilla appeared normal.                           - A single moderate biliary stricture was found in                            the lower third of the main bile duct. The                            stricture was indeterminate.                           - A biliary sphincterotomy was performed.                           -Bile duct stricture was brushed for cytology.                           - One covered metal stent was placed into the                            common bile duct. Moderate Sedation:      Per Anesthesia Care Recommendation:           - Return patient to hospital ward for ongoing care.                           -  Clear liquid diet today.                           - Continue present medications.                           - Avoid aspirin and anticoagulants for 3 days.                           - Labs in AM. Procedure Code(s):        --- Professional ---                           [phone], Esophagogastroduodenoscopy, flexible,                            transoral; with dilation of gastric/duodenal                            stricture(s) (eg, balloon, bougie) Diagnosis Code(s):        --- Professional ---                           K29.70, Gastritis, unspecified, without bleeding                           K31.5, Obstruction of duodenum                           K83.1,  Obstruction of bile duct                           C25.0, Malignant neoplasm of head of pancreas CPT copyright 2019 American Medical Association. All rights reserved. The codes documented in this report are preliminary and upon coder review may  be revised to meet current compliance requirements. Hildred Laser, MD Hildred Laser, MD 11/30/2020 5:26:36 PM This report has been signed electronically. Number of Addenda: 0

## 2020-11-30 NOTE — Consult Note (Addendum)
Consultation Note Date: 11/30/2020   Patient Name: Katelyn Daniel  DOB: 1923/07/10  MRN: [id number]  Age / Sex: 87 y.o., female  PCP: Lucia Gaskins, MD Referring Physician: Murlean Iba, MD  Reason for Consultation: Establishing goals of care and Hospice Evaluation  HPI/Patient Profile: 87 y.o. female  with past medical history of combined heart failure, CKD, HTN/HLD, thyroid disease admitted on 11/27/2020 with pancreatic mass.   Clinical Assessment and Goals of Care: I have reviewed medical records including EPIC notes, labs and imaging, received report from RN, assessed the patient and then met at the bedside along with son/HC POA, Delorse Lek, to discuss diagnosis prognosis, GOC, EOL wishes, disposition and options.  I introduced Palliative Medicine as specialized medical care for people living with serious illness. It focuses on providing relief from the symptoms and stress of a serious illness. The goal is to improve quality of life for both the patient and the family.  We discussed a brief life review of the patient.  Katelyn Daniel is a widow.  She tells me that she lost her husband, son, brother, sister in 08-11-2017 all within 57 months.  She currently lives alone, and has a private pay aide 4 to 5 hours a day.  She does have a long-term care insurance policy which helps pay for the service.    We talked about her current illness and the treatment plan including, but not limited to, ERCP with stenting, prognosis with permission. The natural disease trajectory and expectations at EOL were discussed.  I attempted to elicit values and goals of care important to the patient.  Katelyn Daniel tells me that she understands that she would be eligible for residential hospice at [place] and this is her goal.  We talk in detail about what is and is not provided in residential hospice.  [name] tells me she  is open to not having to take further medications, only those for comfort and dignity.  Discussed the importance of continued conversation with family and the medical providers regarding overall plan of care and treatment options, ensuring decisions are within the context of the patient's values and GOCs.    Questions and concerns were addressed.  Hard Choices booklet left for review. The family was encouraged to call with questions or concerns.  PMT will continue to support holistically.  Conference with attending, bedside nursing staff, transition of care team related to patient condition, needs, goals of care, disposition.   HCPOA   NEXT OF KIN -son, Delorse Lek.  Katelyn Daniel has only 1 living child, her other son died in 08-11-2017 along with her husband, brother, sister.  She tells me that she lost all of these family members over 13 months.    SUMMARY OF RECOMMENDATIONS   At this point agreeable to palliative ERCP with stenting. Requesting residential hospice for comfort and dignity at end-of-life at [place]. Understands that no medications other than those for comfort would be given.   Code Status/Advance Care Planning: DNR  Symptom  Management:  Per hospitalist/GI, no additional needs at this time. Anticipate need for increased pain medicine/nausea medicine as time goes forward.  Palliative Prophylaxis:  Frequent Pain Assessment and Oral Care  Additional Recommendations (Limitations, Scope, Preferences): Requesting comfort and dignity at end-of-life, residential hospice at [place]  Psycho-social/Spiritual:  Desire for further Chaplaincy support:yes Additional Recommendations: Caregiving  Support/Resources and Education on Hospice  Prognosis:  Unable to determine, based on outcomes.  6 to 8 weeks or less would not be surprising based on advancing cancer with no desire for further work-up or treatment. Palliative performance scale: April 2022 60%, current 30% Weight  loss: patient and family endorse weight loss over the last few weeks, but unable to give specifics. Advancing cancer burden without desire for work-up or treatment.   Discharge Planning:  To be determined.  Requesting comfort and dignity, residential hospice at [place].       Primary Diagnoses: Present on Admission:  Intractable nausea and vomiting  Hypothyroidism  SIRS (systemic inflammatory response syndrome) (HCC)  Hypokalemia  Abdominal pain  Pancreatic mass   I have reviewed the medical record, interviewed the patient and family, and examined the patient. The following aspects are pertinent.  Past Medical History:  Diagnosis Date   Candida esophagitis (HCC)    Chronic combined systolic (congestive) and diastolic (congestive) heart failure (HCC)    a. 08/2016: echo showing EF of 45-50%, diffuse HK, Grade 1 DD, and mild MR   CKD (chronic kidney disease), stage III ([place]) 03/09/2017   Heart disease    Hyperlipemia    Hypertension    Iron deficiency anemia    Pneumonia    Thyroid activity decreased    Thyroid disease    Social History   Socioeconomic History   Marital status: Married    Spouse name: Not on file   Number of children: Not on file   Years of education: Not on file   Highest education level: Not on file  Occupational History   Not on file  Tobacco Use   Smoking status: Never   Smokeless tobacco: Never  Vaping Use   Vaping Use: Never used  Substance and Sexual Activity   Alcohol use: No   Drug use: No   Sexual activity: Not Currently  Other Topics Concern   Not on file  Social History Narrative   Not on file   Social Determinants of Health   Financial Resource Strain: Not on file  Food Insecurity: Not on file  Transportation Needs: Not on file  Physical Activity: Not on file  Stress: Not on file  Social Connections: Not on file   Family History  Problem Relation Age of Onset   Asthma Mother    Congestive Heart Failure Mother     Emphysema Father    Heart Problems Brother    Colon cancer Neg Hx    Scheduled Meds:  [MAR Hold] bisoprolol  5 mg Oral Daily   [MAR Hold] gabapentin  300 mg Oral QHS   glucagon (human recombinant)       [MAR Hold] levothyroxine  50 mcg Oral Daily   [MAR Hold] pantoprazole  40 mg Oral Daily   [MAR Hold] polyethylene glycol  17 g Oral Daily   [MAR Hold] pravastatin  40 mg Oral Daily   [MAR Hold] senna-docusate  1 tablet Oral QHS   Continuous Infusions:  sodium chloride     lactated ringers 1,000 mL (11/30/20 1345)   [MAR Hold] piperacillin-tazobactam 3.375 g (11/30/20 0443)  sodium chloride     PRN Meds:.[MAR Hold] acetaminophen **OR** [MAR Hold] acetaminophen, [MAR Hold] hydrALAZINE, [MAR Hold] ondansetron **OR** [MAR Hold] ondansetron (ZOFRAN) IV, [MAR Hold] oxyCODONE, [MAR Hold] polyethylene glycol, simethicone susp in sterile water 1000 mL irrigation, [MAR Hold] traZODone Medications Prior to Admission:  Prior to Admission medications   Medication Sig Start Date End Date Taking? Authorizing Provider  acetaminophen (TYLENOL) 500 MG tablet Take 500 mg by mouth every 6 (six) hours as needed for mild pain.   Yes [provider]  bisoprolol (ZEBETA) 5 MG tablet Take 1 tablet (5 mg total) by mouth daily. 10/04/16  Yes Lucia Gaskins, MD  Calcium Citrate-Vitamin D (CALCIUM CITRATE + D PO) Take 1 tablet by mouth daily.   Yes [provider]  cetirizine (ZYRTEC) 10 MG tablet Take 10 mg by mouth daily. 09/09/20  Yes [provider]  cholecalciferol (VITAMIN D) 1000 units tablet Take 1,000 Units by mouth daily.   Yes [provider]  furosemide (LASIX) 40 MG tablet Take 40 mg by mouth daily.   Yes [provider]  gabapentin (NEURONTIN) 300 MG capsule Take 300 mg by mouth at bedtime. 06/06/16  Yes [provider]  levothyroxine (SYNTHROID, LEVOTHROID) 50 MCG tablet Take 50 mcg by mouth daily. 06/02/16  Yes [provider]  Multiple  Vitamins-Minerals (PRESERVISION AREDS PO) Take 1 capsule by mouth 2 (two) times daily.    Yes [provider]  Omega-3 Fatty Acids (FISH OIL) 1000 MG CAPS Take 1,000 mg by mouth daily.   Yes [provider]  pantoprazole (PROTONIX) 40 MG tablet Take 40 mg by mouth daily.   Yes [provider]  polyethylene glycol powder (GLYCOLAX/MIRALAX) 17 GM/SCOOP powder Take 0.5 Containers by mouth daily. 11/15/20  Yes [provider]  potassium chloride SA (K-DUR,KLOR-CON) 20 MEQ tablet Take 20 mEq by mouth daily.   Yes [provider]  pravastatin (PRAVACHOL) 40 MG tablet Take 40 mg by mouth daily. 03/16/16  Yes [provider]  torsemide (DEMADEX) 20 MG tablet Take 20 mg by mouth 2 (two) times daily. 10/26/20  Yes [provider]  traZODone (DESYREL) 50 MG tablet Take 1 tablet (50 mg total) by mouth at bedtime as needed for sleep. 06/15/16  Yes Lucia Gaskins, MD   No Known Allergies Review of Systems  Unable to perform ROS: Age   Physical Exam Vitals and nursing note reviewed.  Constitutional:      General: She is not in acute distress.    Appearance: Normal appearance.  HENT:     Head: Normocephalic and atraumatic.     Mouth/Throat:     Mouth: Mucous membranes are moist.  Cardiovascular:     Rate and Rhythm: Normal rate.  Pulmonary:     Effort: Pulmonary effort is normal. No respiratory distress.  Abdominal:     Tenderness: There is abdominal tenderness.  Skin:    General: Skin is warm and dry.  Neurological:     Mental Status: She is alert and oriented to person, place, and time.  Psychiatric:        Mood and Affect: Mood normal.        Behavior: Behavior normal.    Vital Signs: BP (!) 181/70   Pulse 68   Temp 98.1 F (36.7 C) (Oral)   Resp 18   Ht 5' 7"  (1.702 m)   Wt 60.6 kg   SpO2 97%   BMI 20.92 kg/m  Pain Scale: 0-10   Pain  Score: 0-No pain   SpO2: SpO2: 97 % O2 Device:SpO2: 97 % O2 Flow Rate: .   IO:  Intake/output summary:  Intake/Output Summary (Last 24 hours) at 11/30/2020 1424 Last data filed at 11/29/2020 2026 Gross per 24 hour  Intake 480 ml  Output 500 ml  Net -20 ml    LBM: Last BM Date: 11/26/20 Baseline Weight: Weight: 59 kg Most recent weight: Weight: 60.6 kg     Palliative Assessment/Data:   Flowsheet Rows    Flowsheet Row Most Recent Value  Intake Tab   Referral Department Hospitalist  Unit at Time of Referral Med/Surg Unit  Palliative Care Primary Diagnosis Cancer  Date Notified 11/28/20  Palliative Care Type New Palliative care  Reason for referral Clarify Goals of Care  Date of Admission 11/27/20  Date first seen by Palliative Care 11/30/20  # of days Palliative referral response time 2 Day(s)  # of days IP prior to Palliative referral 1  Clinical Assessment   Palliative Performance Scale Score 40%  Pain Max last 24 hours Not able to report  Pain Min Last 24 hours Not able to report  Dyspnea Max Last 24 Hours Not able to report  Dyspnea Min Last 24 hours Not able to report  Psychosocial & Spiritual Assessment   Palliative Care Outcomes        Time In: 0940 Time Out: 1050 Time Total: 70 minutes  Greater than 50%  of this time was spent counseling and coordinating care related to the above assessment and plan.  Signed by: Drue Novel, NP   Please contact Palliative Medicine Team phone at [phone] for questions and concerns.  For individual provider: See Shea Evans

## 2020-11-30 NOTE — Anesthesia Preprocedure Evaluation (Addendum)
Anesthesia Evaluation  Patient identified by MRN, date of birth, ID band Patient awake    Reviewed: Allergy & Precautions, NPO status , Patient's Chart, lab work & pertinent test results  Airway Mallampati: III  TM Distance: <3 FB Neck ROM: Full    Dental  (+) Dental Advisory Given   Pulmonary pneumonia,    Pulmonary exam normal breath sounds clear to auscultation       Cardiovascular Exercise Tolerance: Good hypertension, Pt. on medications +CHF    Left ventricle: The cavity size was normal. Wall thickness was increased in a pattern of mild LVH. Systolic function was mildlyreduced. The estimated ejection fraction was in the range of 45% to 50%. Diffuse hypokinesis. There is moderate hypokinesis of the basal-midinferolateral myocardium. Doppler parameters are consistent with abnormal left ventricular relaxation (grade 1  diastolic dysfunction).  - Aortic valve: Mildly calcified annulus. Trileaflet; mildly  calcified leaflets. There was mild regurgitation. Mean gradient  (S): 5 mm Hg.  - Mitral valve: Calcified annulus. There was mild regurgitation.  - Left atrium: The atrium was mildly dilated.  - Right atrium: The atrium was at the upper limits of normal in size.  - Atrial septum: No defect or patent foramen ovale was identified.  - Tricuspid valve: There was mild regurgitation.  - Pulmonary arteries: PA peak pressure: 34 mm Hg (S).  - Pericardium, extracardiac: A trivial pericardial effusion was identified posterior to the heart.    Neuro/Psych Anxiety negative neurological ROS     GI/Hepatic GERD  Medicated,  Endo/Other  Hypothyroidism   Renal/GU Renal InsufficiencyRenal disease     Musculoskeletal   Abdominal   Peds  Hematology  (+) anemia ,   Anesthesia Other Findings 27-Nov-2020 19:23:37 [facility]-ER ROUTINE RECORD Nov 26, 1923 (89 yr) Female Caucasian Vent. rate 66 BPM PR  interval 205 ms QRS duration 117 ms QT/QTcB 437/458 ms P-R-T axes 59 -55 49 Sinus rhythm Left anterior fascicular block Low voltage, precordial leads Consider anterior infarct When compared with ECG of EARLIER SAME DATE No significant change was found Confirmed by Delora Fuel ([id number]) on 11/28/2020 4:37:55 AM  Reproductive/Obstetrics                           Anesthesia Physical Anesthesia Plan  ASA: 4  Anesthesia Plan: General   Post-op Pain Management:    Induction: Intravenous  PONV Risk Score and Plan: 4 or greater  Airway Management Planned: Oral ETT  Additional Equipment:   Intra-op Plan:   Post-operative Plan: Extubation in OR  Informed Consent: I have reviewed the patients History and Physical, chart, labs and discussed the procedure including the risks, benefits and alternatives for the proposed anesthesia with the patient or authorized representative who has indicated his/her understanding and acceptance.    Discussed DNR with patient.     Plan Discussed with:   Anesthesia Plan Comments: (Agreed to get brief resuscitation in case of cardiac arrest. )       Anesthesia Quick Evaluation

## 2020-11-30 NOTE — Progress Notes (Signed)
Patient seen this morning briefly. Discussed plans for ERCP with palliative stent placement by Dr. Laural Golden. Aware of risks and benefits. She is NPO. Eager to eat when able. No complaints this morning. Mild hypokalemia at 3.3 and receiving IV supplementation. Receiving Zosyn IV every 8 hours.   Annitta Needs, PhD, ANP-BC [facility]

## 2020-11-30 NOTE — Progress Notes (Signed)
Messaged Dr. Wynetta Emery via chat about rather or not patient could now eat or drink. Waiting for response.

## 2020-11-30 NOTE — TOC Initial Note (Addendum)
Transition of Care [facility]) - Initial/Assessment Note    Patient Details  Name: Katelyn Daniel MRN: [id number] Date of Birth: March 09, 1924  Transition of Care [facility]) CM/SW Contact:    Shade Flood, LCSW Phone Number: 11/30/2020, 2:48 PM  Clinical Narrative:                  Pt admitted from home where she lives alone with the assistance of paid caregivers 4-5 hours per day. Pt with newly diagnosed advanced pancreatic cancer per MD. Palliative APNP has consulted and relayed to [facility] that pt interested in Residential Hospice at [facility] if she is eligible. Referral sent to Cassandra at [facility]. Awaiting determination on pt's eligibility for residential hospice.  TOC will follow to assist with dc planning.  1639: Received return call from [place] at [facility] stating pt is approved for [facility]. They have a bed and will hold for pt until they hear from [facility] tomorrow AM updating if pt ready for transfer. Per Cassandra, the Covid test on file is adequate for admission. Assigned TOC will follow up in AM.  Expected Discharge Plan: [place] Barriers to Discharge: Continued Medical Work up   Patient Goals and CMS Choice   CMS Medicare.gov Compare Post Acute Care list provided to:: Patient Represenative (must comment) Choice offered to / list presented to : Adult Children  Expected Discharge Plan and Services Expected Discharge Plan: [place] In-house Referral: Clinical Social Work   Post Acute Care Choice: Hospice Living arrangements for the past 2 months: [place]                                      Prior Living Arrangements/Services Living arrangements for the past 2 months: Single Family Home Lives with:: Self Patient language and need for interpreter reviewed:: Yes        Need for Family Participation in Patient Care: No (Comment) Care giver support system in place?: Yes (comment)   Criminal  Activity/Legal Involvement Pertinent to Current Situation/Hospitalization: No - Comment as needed  Activities of Daily Living Home Assistive Devices/Equipment: Cane (specify quad or straight), Eyeglasses, Walker (specify type) ADL Screening (condition at time of admission) Patient's cognitive ability adequate to safely complete daily activities?: Yes Is the patient deaf or have difficulty hearing?: No Does the patient have difficulty seeing, even when wearing glasses/contacts?: No Does the patient have difficulty concentrating, remembering, or making decisions?: No Patient able to express need for assistance with ADLs?: Yes Does the patient have difficulty dressing or bathing?: No Independently performs ADLs?: Yes (appropriate for developmental age) Does the patient have difficulty walking or climbing stairs?: Yes Weakness of Legs: Both Weakness of Arms/Hands: None  Permission Sought/Granted                  Emotional Assessment Appearance:: Appears stated age Attitude/Demeanor/Rapport: Engaged Affect (typically observed): Pleasant Orientation: : Oriented to Self, Oriented to Place, Oriented to  Time, Oriented to Situation Alcohol / Substance Use: Not Applicable Psych Involvement: No (comment)  Admission diagnosis:  Abnormal CT of the abdomen [R93.5] Intractable nausea and vomiting [R11.2] Abdominal pain [R10.9] Fever, unspecified fever cause [R50.9] Nausea and vomiting, intractability of vomiting not specified, unspecified vomiting type [R11.2] Patient Active Problem List   Diagnosis Date Noted   SIRS (systemic inflammatory response syndrome) ([place]) 11/28/2020   Hypokalemia 11/28/2020   Abdominal pain 11/28/2020  Pancreatic mass 11/28/2020   Fever    Intractable nausea and vomiting 11/27/2020   CKD (chronic kidney disease), stage III ([place]) 03/09/2017   Elevated troponin 03/09/2017   Anxiety 03/09/2017   GERD (gastroesophageal reflux disease) 03/09/2017   Underweight  03/09/2017   Aortic atherosclerosis ([place]) 03/09/2017   Hyperglycemia, drug-induced 03/09/2017   Lobar pneumonia ([place]) 03/05/2017   Acute respiratory failure with hypoxemia ([place]) 03/05/2017   Aspiration pneumonia ([place]) 09/23/2016   Acute on chronic combined systolic and diastolic CHF (congestive heart failure) ([place]) 09/23/2016   Iron deficiency anemia    Generalized weakness 06/12/2016   Mild dehydration 06/12/2016   Hypothyroidism 06/12/2016   Essential hypertension 06/12/2016   Hyperlipidemia 06/12/2016   PCP:  Lucia Gaskins, MD Pharmacy:   [facility] [address] [place] [address] Phone: [phone] Fax: [phone]     Social Determinants of Health (SDOH) Interventions    Readmission Risk Interventions Readmission Risk Prevention Plan 11/30/2020  Transportation Screening Complete  Medication Review (RN CM) Complete  Some recent data might be hidden

## 2020-11-30 NOTE — Progress Notes (Signed)
Patient son added to the designated visitor list. Per Patient's son, she has a bed at [facility]. ERCP report stated that there was a stent placed. Patient has had minimal pain since her return, minor pain to back relieved with tylenol. Patient returned with several coban wraps, was told that patient Ivs blew and were pulled out by patient. Patient now seems more alert and oriented as before she left. Vitals were stable post procedure. #22g in LFA wrapped and infusing.

## 2020-11-30 NOTE — Anesthesia Postprocedure Evaluation (Signed)
Anesthesia Post Note  Patient: Katelyn Daniel  Procedure(s) Performed: ENDOSCOPIC RETROGRADE CHOLANGIOPANCREATOGRAPHY (ERCP) BILIARY STENT PLACEMENT SPHINCTEROTOMY with duodenal stricture dilation  Patient location during evaluation: PACU Anesthesia Type: General Level of consciousness: awake and alert, oriented and sedated Pain management: pain level controlled Vital Signs Assessment: post-procedure vital signs reviewed and stable Respiratory status: spontaneous breathing, respiratory function stable and patient connected to nasal cannula oxygen Cardiovascular status: blood pressure returned to baseline and stable Postop Assessment: no apparent nausea or vomiting Anesthetic complications: no   No notable events documented.   Last Vitals:  Vitals:   11/30/20 1520 11/30/20 1530  BP: (!) 144/75 (!) 173/74  Pulse: 67 66  Resp: (!) 24 19  Temp: 36.7 C   SpO2: 97% 98%    Last Pain:  Vitals:   11/30/20 1520  TempSrc:   PainSc: 0-No pain                 Deago Burruss C Merek Niu

## 2020-11-30 NOTE — Progress Notes (Signed)
Subjective:  Patient complains of pain in upper abdomen mainly in right upper quadrant.  She denies nausea or vomiting.  She states she has been losing weight for the last couple of months along with anorexia.  She has lost 20 pounds.  No history of diarrhea fever or chills.  She denies shortness of breath.  She has noted some vague discomfort across lower chest since she has been having this pain.  Current Medications:  Current Facility-Administered Medications:    0.9 %  sodium chloride infusion, , Intravenous, Continuous, Annitta Needs, NP   [MAR Hold] acetaminophen (TYLENOL) tablet 650 mg, 650 mg, Oral, Q6H PRN, 650 mg at 11/30/20 0729 **OR** [MAR Hold] acetaminophen (TYLENOL) suppository 650 mg, 650 mg, Rectal, Q6H PRN, Zierle-Ghosh, Asia B, DO   [MAR Hold] bisoprolol (ZEBETA) tablet 5 mg, 5 mg, Oral, Daily, Zierle-Ghosh, Asia B, DO, 5 mg at 11/29/20 0926   chlorhexidine (PERIDEX) 0.12 % solution 15 mL, 15 mL, Mouth/Throat, Once **OR** MEDLINE mouth rinse, 15 mL, Mouth Rinse, Once, Battula, Rajamani C, MD   [MAR Hold] gabapentin (NEURONTIN) capsule 300 mg, 300 mg, Oral, QHS, Zierle-Ghosh, Asia B, DO, 300 mg at 11/29/20 2030   glucagon (human recombinant) (GLUCAGEN) 1 MG injection, , , ,    [MAR Hold] hydrALAZINE (APRESOLINE) injection 10 mg, 10 mg, Intravenous, Q4H PRN, Johnson, Clanford L, MD   lactated ringers infusion, , Intravenous, Continuous, Battula, Rajamani C, MD   [MAR Hold] levothyroxine (SYNTHROID) tablet 50 mcg, 50 mcg, Oral, Daily, Zierle-Ghosh, Asia B, DO, 50 mcg at 11/29/20 0501   [MAR Hold] ondansetron (ZOFRAN) tablet 4 mg, 4 mg, Oral, Q6H PRN **OR** [MAR Hold] ondansetron (ZOFRAN) injection 4 mg, 4 mg, Intravenous, Q6H PRN, Zierle-Ghosh, Asia B, DO   [MAR Hold] oxyCODONE (Oxy IR/ROXICODONE) immediate release tablet 5 mg, 5 mg, Oral, Q4H PRN, Zierle-Ghosh, Asia B, DO   [MAR Hold] pantoprazole (PROTONIX) EC tablet 40 mg, 40 mg, Oral, Daily, Zierle-Ghosh, Asia B, DO, 40 mg  at 11/29/20 0926   [MAR Hold] piperacillin-tazobactam (ZOSYN) IVPB 3.375 g, 3.375 g, Intravenous, Q8H, Zierle-Ghosh, Asia B, DO, Last Rate: 12.5 mL/hr at 11/30/20 0443, 3.375 g at 11/30/20 0443   [MAR Hold] polyethylene glycol (MIRALAX / GLYCOLAX) packet 17 g, 17 g, Oral, Daily PRN, Johnson, Clanford L, MD, 17 g at 11/29/20 1113   [MAR Hold] polyethylene glycol (MIRALAX / GLYCOLAX) packet 17 g, 17 g, Oral, Daily, Annitta Needs, NP   [MAR Hold] pravastatin (PRAVACHOL) tablet 40 mg, 40 mg, Oral, Daily, Zierle-Ghosh, Asia B, DO, 40 mg at 11/29/20 0926   [MAR Hold] senna-docusate (Senokot-S) tablet 1 tablet, 1 tablet, Oral, QHS, Johnson, Clanford L, MD, 1 tablet at 11/29/20 2029   sodium chloride 0.9 % infusion, , , ,    [MAR Hold] traZODone (DESYREL) tablet 50 mg, 50 mg, Oral, QHS PRN, Zierle-Ghosh, Asia B, DO, 50 mg at 11/29/20 2116   Objective: Blood pressure (!) 181/70, pulse 68, temperature 98.1 F (36.7 C), temperature source Oral, resp. rate 18, height _0  (1.702 m), weight 60.6 kg, SpO2 97 %. Patient is alert and in no acute distress. Conjunctiva is pink.  Sclera is nonicteric. Oropharyngeal mucosa is normal. No neck masses or thyromegaly. Cardiac exam with regular rhythm normal S1 and S2.  No murmur gallop noted. Auscultation lungs reveal vesicular breath sounds bilaterally. Abdomen is symmetrical.  On palpation is soft.  Mild tenderness noted in midepigastrium and below the right costal margin.  No hepatosplenomegaly.  Gallbladder not  palpable.  Labs/studies Results:   CBC Latest Ref Rng & Units 11/30/2020 11/29/2020 11/28/2020  WBC 4.0 - 10.5 K/uL 6.1 7.8 10.7(H)  Hemoglobin 12.0 - 15.0 g/dL 11.5(L) 11.9(L) 11.6(L)  Hematocrit 36.0 - 46.0 % 34.7(L) 35.8(L) 34.4(L)  Platelets 150 - 400 K/uL 243 251 242    CMP Latest Ref Rng & Units 11/30/2020 11/29/2020 11/28/2020  Glucose 70 - 99 mg/dL 106(H) 114(H) 135(H)  BUN 8 - 23 mg/dL _0 Creatinine 0.44 - 1.00 mg/dL 1.16(H) 1.20(H)  1.18(H)  Sodium 135 - 145 mmol/L 138 136 136  Potassium 3.5 - 5.1 mmol/L 3.3(L) 3.6 4.0  Chloride 98 - 111 mmol/L 106 101 102  CO2 22 - 32 mmol/L _1 Calcium 8.9 - 10.3 mg/dL 8.5(L) 8.4(L) 8.5(L)  Total Protein 6.5 - 8.1 g/dL 5.4(L) 5.5(L) 5.4(L)  Total Bilirubin 0.3 - 1.2 mg/dL 0.9 0.7 0.6  Alkaline Phos 38 - 126 U/L 148(H) 127(H) 112  AST 15 - 41 U/L 51(H) 52(H) 39  ALT 0 - 44 U/L 47(H) 43 33    Hepatic Function Latest Ref Rng & Units 11/30/2020 11/29/2020 11/28/2020  Total Protein 6.5 - 8.1 g/dL 5.4(L) 5.5(L) 5.4(L)  Albumin 3.5 - 5.0 g/dL 2.8(L) 2.8(L) 2.9(L)  AST 15 - 41 U/L 51(H) 52(H) 39  ALT 0 - 44 U/L 47(H) 43 33  Alk Phosphatase 38 - 126 U/L 148(H) 127(H) 112  Total Bilirubin 0.3 - 1.2 mg/dL 0.9 0.7 0.6    CT and MRI reviewed.   Assessment:  #1.  Large ill-defined pancreatic mass concerning for malignancy associated with dilated biliary system and distended gallbladder.  Patient's bilirubin remains normal and transaminases and AP are only mildly elevated.  It is letter of time before these numbers would start rising. She appears to have metastatic disease.  It would be reasonable to palliate her with biliary stenting.  If endoscopic approach is not successful percutaneous drainage would be an option. I have reviewed the procedure with her son Charlotte Crumb over the phone and he is agreeable. I explained to him the plan is to put in a Wallstent.  I would also do brush cytology. Patient is also agreeable to proceed. Imaging studies raise the possibility of cholangitis.  Patient is on IV Zosyn.  #2 mild hypokalemia.  Patient is IV KCl.  #3.  Mild anemia.  I wonder if his hemoglobin is normal for age.  No evidence of overt GI bleed.    Plan:  ERCP with biliary stenting under general endotracheal anesthesia.

## 2020-11-30 NOTE — Progress Notes (Signed)
Patient off unit for ERCP. She had been NPO prior to procedure. She is alert/oriented. She received all 3 ordered KCL riders at a rate of 60ml/hr due to pain at IV site from K. No redness or tenderness post infusion. Flushed well.

## 2020-12-01 ENCOUNTER — Encounter (HOSPITAL_COMMUNITY): Payer: Self-pay | Admitting: Internal Medicine

## 2020-12-01 LAB — CBC
HCT: 37.6 % (ref 36.0–46.0)
Hemoglobin: 12.6 g/dL (ref 12.0–15.0)
MCH: 33.8 pg (ref 26.0–34.0)
MCHC: 33.5 g/dL (ref 30.0–36.0)
MCV: 100.8 fL — ABNORMAL HIGH (ref 80.0–100.0)
Platelets: 321 10*3/uL (ref 150–400)
RBC: 3.73 MIL/uL — ABNORMAL LOW (ref 3.87–5.11)
RDW: 13.8 % (ref 11.5–15.5)
WBC: 7.1 10*3/uL (ref 4.0–10.5)
nRBC: 0 % (ref 0.0–0.2)

## 2020-12-01 LAB — COMPREHENSIVE METABOLIC PANEL
ALT: 49 U/L — ABNORMAL HIGH (ref 0–44)
AST: 50 U/L — ABNORMAL HIGH (ref 15–41)
Albumin: 2.8 g/dL — ABNORMAL LOW (ref 3.5–5.0)
Alkaline Phosphatase: 186 U/L — ABNORMAL HIGH (ref 38–126)
Anion gap: 7 (ref 5–15)
BUN: 15 mg/dL (ref 8–23)
CO2: 25 mmol/L (ref 22–32)
Calcium: 8.6 mg/dL — ABNORMAL LOW (ref 8.9–10.3)
Chloride: 105 mmol/L (ref 98–111)
Creatinine, Ser: 1.08 mg/dL — ABNORMAL HIGH (ref 0.44–1.00)
GFR, Estimated: 47 mL/min — ABNORMAL LOW (ref >60)
Glucose, Bld: 129 mg/dL — ABNORMAL HIGH (ref 70–99)
Potassium: 4.2 mmol/L (ref 3.5–5.1)
Sodium: 137 mmol/L (ref 135–145)
Total Bilirubin: 0.6 mg/dL (ref 0.3–1.2)
Total Protein: 5.6 g/dL — ABNORMAL LOW (ref 6.5–8.1)

## 2020-12-01 LAB — MAGNESIUM: Magnesium: 2.3 mg/dL (ref 1.7–2.4)

## 2020-12-01 MED ORDER — CAPSAICIN 0.033 % EX CREA: TOPICAL_CREAM | CUTANEOUS | 0 refills | Status: AC

## 2020-12-01 MED ORDER — POLYETHYLENE GLYCOL 3350 17 G PO PACK: 17.0000 g | PACK | Freq: Two times a day (BID) | ORAL | 0 refills | Status: AC

## 2020-12-01 MED ORDER — SENNOSIDES-DOCUSATE SODIUM 8.6-50 MG PO TABS: 1.0000 | ORAL_TABLET | Freq: Every day | ORAL | 0 refills | Status: AC

## 2020-12-01 MED ORDER — LIDOCAINE 5 % EX PTCH: 1.0000 | MEDICATED_PATCH | CUTANEOUS | 0 refills | Status: AC

## 2020-12-01 MED ORDER — POLYETHYLENE GLYCOL 3350 17 G PO PACK: 17.0000 g | PACK | Freq: Two times a day (BID) | ORAL | Status: DC

## 2020-12-01 NOTE — Progress Notes (Signed)
Subjective: Patient doing well this morning. Endorses some continued abdominal pressure as well as some nausea without vomiting, however, she denies abdominal pain and states she is hungry.  She does endorse some R shoulder pain, worse over the past 2 days, resembles previous pain she had with shingles many years ago, no relief from tylenol or lidocaine patch. Reports constipation, last BM was Friday.  Objective: Vital signs in last 24 hours: Temp:  [97.9 F (36.6 C)-98.4 F (36.9 C)] 98.4 F (36.9 C) (06/29 0531) Pulse Rate:  [61-68] 68 (06/29 0531) Resp:  [14-24] 16 (06/28 2203) BP: (144-187)/(70-88) 152/77 (06/29 0531) SpO2:  [94 %-98 %] 96 % (06/29 0531) Last BM Date: 11/26/20 General:   Alert and oriented, pleasant Heart:  S1, S2 present, no murmurs noted.  Lungs: Clear to auscultation bilaterally, without wheezing, rales, or rhonchi.  Abdomen:  Bowel sounds present, soft, non-tender, non-distended. No HSM or hernias noted. No rebound or guarding. No masses appreciated  Msk:  Symmetrical without gross deformities. Normal posture. Pulses:  Normal pulses noted. Extremities:  Without edema. Neurologic:  Alert and  oriented x4;  grossly normal neurologically. Skin:  Warm and dry, intact without significant lesions or rashes Psych:  Alert and cooperative. Normal mood and affect.  Intake/Output from previous day: 06/28 0701 - 06/29 0700 In: 1817.7 [P.O.:480; I.V.:1000; IV Piggyback:337.7] Out: 700 [Urine:700] Intake/Output this shift: No intake/output data recorded.  Lab Results: Recent Labs    11/29/20 0555 11/30/20 0532 12/01/20 0611  WBC 7.8 6.1 7.1  HGB 11.9* 11.5* 12.6  HCT 35.8* 34.7* 37.6  PLT 251 243 321   BMET Recent Labs    11/29/20 0555 11/30/20 0532 12/01/20 0611  NA 136 138 137  K 3.6 3.3* 4.2  CL 101 106 105  CO2 27 26 25   GLUCOSE 114* 106* 129*  BUN 17 14 15   CREATININE 1.20* 1.16* 1.08*  CALCIUM 8.4* 8.5* 8.6*   LFT Recent Labs     11/29/20 0555 11/30/20 0532 12/01/20 0611  PROT 5.5* 5.4* 5.6*  ALBUMIN 2.8* 2.8* 2.8*  AST 52* 51* 50*  ALT 43 47* 49*  ALKPHOS 127* 148* 186*  BILITOT 0.7 0.9 0.6    Studies/Results: MR ABDOMEN MRCP WO CONTRAST  Result Date: 11/29/2020 CLINICAL DATA:  RIGHT upper quadrant abdominal pain, nondiagnostic ultrasound. EXAM: MRI ABDOMEN WITHOUT CONTRAST  (INCLUDING MRCP) TECHNIQUE: Multiplanar multisequence MR imaging of the abdomen was performed. Heavily T2-weighted images of the biliary and pancreatic ducts were obtained, and three-dimensional MRCP images were rendered by post processing. COMPARISON:  Abdominal sonogram November 28, 2020. CT of the abdomen and pelvis of November 27, 2020. FINDINGS: Lower chest: Trace effusions.  No consolidation. Hepatobiliary: Marked distension of the gallbladder. Abrupt termination of the biliary tree at the level of a pancreatic mass. Sludge in the dependent gallbladder. Variable signal in the hepatic parenchyma in hepatic subsegment VIII with some segmental biliary duct dilation is nonspecific. Pericholecystic stranding and edema in the setting of ascites and peritoneal disease without significant gallbladder wall thickening. There is generalized edema throughout the abdomen. Generalized edema is noted throughout the retroperitoneum and there is small volume ascites. Pancreas: Minimal intrinsic T1 signal remaining in the pancreas in the pancreatic head. The large pancreatic mass, ill-defined and invading/encasing surrounding structures. Limited assessment due to lack of contrast. Pilar Plate in case mint of the celiac is noted. Mass tracking into hepatic duodenal ligament with peripheral ductal dilation atrophy may measure as much as 8 x 3.4 cm. Common bile  duct obstructed at the upper margin of the mass. Peripheral pancreatic duct also with dilation peripherally. Masslike area contacting the posterior stomach. Restricted diffusion within peritoneal nodularity that was seen on  the previous study for instance on image 27 of series 8 a 13 mm nodule in the anterior peritoneum. Adenopathy in the region of the RIGHT diaphragmatic crus (image 17/8) 12 mm. Spleen:  Normal spleen. Adrenals/Urinary Tract:  Adrenal glands are normal. Cyst in the upper pole the RIGHT kidney and renal sinus cysts on the LEFT. No hydronephrosis. Stomach/Bowel: No acute gastrointestinal process to the extent evaluated on MR. Suspected tumor contacts posterior wall the stomach. Vascular/Lymphatic: Vascular involvement in the upper abdomen with frank involvement of the celiac and its branches best seen on image 18 and 19 of series 8. Other: Signs of ascites as seen on recent CT of the abdomen and pelvis. Peritoneal nodularity also as outlined above. Musculoskeletal: No suspicious bone lesions identified. IMPRESSION: Positive for infiltrative pancreatic neoplasm vascular involvement, peritoneal disease and upper abdominal nodal enlargement. Biliary obstruction secondary to large mass with continued distension of the gallbladder without significant wall thickening. Lack of contrast limiting assessment. No specific findings of cholecystitis with generalized edema in the upper abdomen and ascites/peritoneal disease. Area of signal abnormality in segmental biliary duct distension potentially related to cholangitis. Underlying small hepatic lesion could lead to this appearance as well, attention on follow-up. Electronically Signed   By: Zetta Bills M.D.   On: 11/29/2020 10:15   MR 3D Recon At Scanner  Result Date: 11/29/2020 CLINICAL DATA:  RIGHT upper quadrant abdominal pain, nondiagnostic ultrasound. EXAM: MRI ABDOMEN WITHOUT CONTRAST  (INCLUDING MRCP) TECHNIQUE: Multiplanar multisequence MR imaging of the abdomen was performed. Heavily T2-weighted images of the biliary and pancreatic ducts were obtained, and three-dimensional MRCP images were rendered by post processing. COMPARISON:  Abdominal sonogram November 28, 2020.  CT of the abdomen and pelvis of November 27, 2020. FINDINGS: Lower chest: Trace effusions.  No consolidation. Hepatobiliary: Marked distension of the gallbladder. Abrupt termination of the biliary tree at the level of a pancreatic mass. Sludge in the dependent gallbladder. Variable signal in the hepatic parenchyma in hepatic subsegment VIII with some segmental biliary duct dilation is nonspecific. Pericholecystic stranding and edema in the setting of ascites and peritoneal disease without significant gallbladder wall thickening. There is generalized edema throughout the abdomen. Generalized edema is noted throughout the retroperitoneum and there is small volume ascites. Pancreas: Minimal intrinsic T1 signal remaining in the pancreas in the pancreatic head. The large pancreatic mass, ill-defined and invading/encasing surrounding structures. Limited assessment due to lack of contrast. Pilar Plate in case mint of the celiac is noted. Mass tracking into hepatic duodenal ligament with peripheral ductal dilation atrophy may measure as much as 8 x 3.4 cm. Common bile duct obstructed at the upper margin of the mass. Peripheral pancreatic duct also with dilation peripherally. Masslike area contacting the posterior stomach. Restricted diffusion within peritoneal nodularity that was seen on the previous study for instance on image 27 of series 8 a 13 mm nodule in the anterior peritoneum. Adenopathy in the region of the RIGHT diaphragmatic crus (image 17/8) 12 mm. Spleen:  Normal spleen. Adrenals/Urinary Tract:  Adrenal glands are normal. Cyst in the upper pole the RIGHT kidney and renal sinus cysts on the LEFT. No hydronephrosis. Stomach/Bowel: No acute gastrointestinal process to the extent evaluated on MR. Suspected tumor contacts posterior wall the stomach. Vascular/Lymphatic: Vascular involvement in the upper abdomen with frank involvement of the celiac  and its branches best seen on image 18 and 19 of series 8. Other: Signs of  ascites as seen on recent CT of the abdomen and pelvis. Peritoneal nodularity also as outlined above. Musculoskeletal: No suspicious bone lesions identified. IMPRESSION: Positive for infiltrative pancreatic neoplasm vascular involvement, peritoneal disease and upper abdominal nodal enlargement. Biliary obstruction secondary to large mass with continued distension of the gallbladder without significant wall thickening. Lack of contrast limiting assessment. No specific findings of cholecystitis with generalized edema in the upper abdomen and ascites/peritoneal disease. Area of signal abnormality in segmental biliary duct distension potentially related to cholangitis. Underlying small hepatic lesion could lead to this appearance as well, attention on follow-up. Electronically Signed   By: Zetta Bills M.D.   On: 11/29/2020 10:15   DG ERCP  Result Date: 11/30/2020 CLINICAL DATA:  Pancreatic mass with suspected malignant obstructed jaundice EXAM: ERCP TECHNIQUE: Multiple spot images obtained with the fluoroscopic device and submitted for interpretation post-procedure. FLUOROSCOPY TIME:  Fluoroscopy Time:  1 minutes 18 seconds Number of Acquired Spot Images: 0 COMPARISON:  MRCP 11/29/2020 FINDINGS: A total of 7 intraoperative saved images are submitted for review. The images demonstrate a flexible duodenal scope in the descending duodenum with wire cannulation of the intrahepatic ducts. Cholangiography demonstrates marked dilation of the extrahepatic biliary tree with focal high-grade stenosis versus occlusion of the distal most common bile duct. Final image demonstrates placement of a self expanding metallic biliary stent. IMPRESSION: 1. High-grade stenosis versus occlusion of the distal most common bile duct. 2. ERCP with placement of a self expanding metallic biliary stent. These images were submitted for radiologic interpretation only. Please see the procedural report for the amount of contrast and the  fluoroscopy time utilized. Electronically Signed   By: Jacqulynn Cadet M.D.   On: 11/30/2020 15:26    Assessment: Katelyn Daniel is a pleasant 87 year old female presenting to the ED on 6/25 with progressive anorexia, nausea, weakness, weight loss (20 lbs over the past few months), abdominal pressure and febrile in ED with Tmax 102.8, found to have asymmetric thickening of pancreatic head and mild to moderate intra and extrahepatic biliary ductal dilation.   LFTs remain mildly elevated Alk Phos (186, trending up since 6/27) and transaminases (ALT 49, AST 50 trending up slightly, but remain stable). Mild leukocytosis on admission has now resolved. Total bilirubin remains WNL. Albumin low but stable at 2.8.  MRCP completed 6/27 with infiltrative pancreatic neoplasm, peritoneal disease, upper abdominal nodal enlargement. Biliary obstruction noted secondary to large mass. Patient on prophylactic antibiotics r/t concerns for cholangitis.   Patient previously expressed wishes to pursue no further treatment measures such as chemotherapy or surgery for pancreatic mass. However,ERCP was performed yesterday by Dr. Laural Golden for palliative biliary stent placement. Findings and interventions are as follows:  Antral gastritis and duodenitis with duodenal stricture dilated with balloon dilated to 15 mm. Distal CBD stricture with dilation of biliary system upstream and patent cystic duct. 5 mm sphincterotomy performed. Brushing cytology obtained of bile duct stricture.10 mm x 60 mm fully covered Wallstent placed for biliary decompression. Patient tolerated the procedure well.  Patient c/o ongoing constipation, last BM Friday. Miralax was ordered by our service yesterday, however, It looks like patient missed a few doses due to her procedure yesterday. Will increase to BID and continue senokot daily as well.  Patient also c/o ongoing R shoulder pain that has worsened over the past 2 days. She has received tylenol as  well as a lidocaine patch and  heat packs without much relief. She reports pain feels similar to previous herpetic neuralgia from shingles and is requesting something "topical for pain." Will Make hospitalist aware.  Per Hospitalist, Patient will likely transition to Hospice later today   Plan: Advance to soft diet Continue to monitor LFTs Will Change Miralax frequency to BID and continue daily senokot   LOS: 3 days    12/01/2020, 8:51 AM  Mata Rowen L. Alver Sorrow, MSN, APRN, AGNP-C Adult-Gerontology Nurse Practitioner [facility]

## 2020-12-01 NOTE — Progress Notes (Signed)
Report provided to Levada Dy, Therapist, sports at the [facility]. Discharge instructions sent with packet via EMS. PIV removed intact, w/o complications. Son notified of discharge. Pt belongings sent with pt.

## 2020-12-01 NOTE — Care Management Important Message (Signed)
Important Message  Patient Details  Name: Katelyn Daniel MRN: [id number] Date of Birth: 05-02-1924   Medicare Important Message Given:  Yes     Tommy Medal 12/01/2020, 11:58 AM

## 2020-12-01 NOTE — TOC Transition Note (Signed)
Transition of Care [facility]) - CM/SW Discharge Note   Patient Details  Name: Katelyn Daniel MRN: [id number] Date of Birth: 08-Apr-1924  [facility] Care [facility] CM/SW Contact:  Salome Arnt, LCSW Phone Number: 12/01/2020, 11:31 AM   Clinical Narrative:  Pt to transfer to [facility] today for end of life care. LCSW confirmed plan with pt's son and pt will transport via [place] after 1:00. D/C summary sent to [facility]. Cassandra at hospice notified.  RN given number to call report.      Final next level of care: [place] Barriers to Discharge: Barriers Resolved   Patient Goals and CMS Choice   CMS Medicare.gov Compare Post Acute Care list provided to:: Patient Represenative (must comment) Choice offered to / list presented to : Adult Children  Discharge Placement                Patient to be transferred to facility by: [facility] Name of family member notified: Charlotte Crumb- son Patient and family notified of of transfer: 12/01/20  Discharge Plan and Services In-house Referral: Clinical Social Work   Post Acute Care Choice: Hospice          DME Arranged: N/A DME Agency: NA                  Social Determinants of Health (SDOH) Interventions     Readmission Risk Interventions Readmission Risk Prevention Plan 11/30/2020  Transportation Screening Complete  Medication Review (RN CM) Complete  Some recent data might be hidden

## 2020-12-01 NOTE — Discharge Summary (Signed)
Physician Discharge Summary  Katelyn Daniel CVE:[id number] DOB: 11-30-1923 DOA: 11/27/2020  PCP: Lucia Gaskins, MD  Admit date: 11/27/2020  Discharge date: 12/01/2020  Admitted From:Home  Disposition:  Hospice home  Recommendations for Outpatient Follow-up:  Follow up with hospice home Continue on medications as noted below  Home Health:None  Equipment/Devices:None  Discharge Condition:Stable  CODE STATUS: DNR  Diet recommendation: Regular  Brief/Interim Summary: Per HPI: 87 y.o. female, with history of Combined CHF, CKD, HLD, HTN, thyroid disease and more presents to the ED with a chief complaint of nausea and vomiting. She reports that it started yesterday.She reports that was having episodes of non-bloody emesis q15 mins.  She reports associated generalized weakness, and today she slumped onto the floor and was unable to get up.  She reports that she did not fall she had been sitting on the couch and then she slumped down to the floor.  She reports a pressure like pain in her abdomen that she thinks was more concentrated in her lower abdomen.  She reports some diarrhea that she thinks was nonbloody.  She reports that she has macular degeneration so her vision is not good and she cannot be sure that there was no blood.  She had associated fever and chills.  She reports chronic abdominal pain and back pain from shingles.  3 days ago she did have chest pain directly under her life alert necklace.  She reports the pain was resolved with taking off the necklace.  She denies any dyspnea or palpitations.  She denies any dysuria.  She is oriented x3.  Patient has no other complaints at this time.  Patient was admitted with suspected pancreatic cancer with metastatic disease and had findings of biliary obstruction.  GI consultation was obtained and patient had undergone ERCP with palliative stent placement on 6/28.  Her prognosis is quite grave and she would not want chemotherapy or any  further surgical treatment for this condition.  She is agreeable with palliative care and is agreeable to residential hospice placement in [place].  She will discharge today to this hospice facility and has otherwise been comfortable and tolerating her diet. No other acute events noted during this admission.  Discharge Diagnoses:  Principal Problem:   Pancreatic mass Active Problems:   Hypothyroidism   Intractable nausea and vomiting   SIRS (systemic inflammatory response syndrome) (HCC)   Hypokalemia   Abdominal pain   Fever   Dilated cbd, acquired   Biliary obstruction  Principal discharge diagnosis: Suspected pancreatic cancer with metastatic disease s/p palliative biliary stent placement.  Discharge Instructions  Discharge Instructions     Diet - low sodium heart healthy   Complete by: As directed    Increase activity slowly   Complete by: As directed       Allergies as of 12/01/2020   No Known Allergies      Medication List     TAKE these medications    acetaminophen 500 MG tablet Commonly known as: TYLENOL Take 500 mg by mouth every 6 (six) hours as needed for mild pain.   bisoprolol 5 MG tablet Commonly known as: ZEBETA Take 1 tablet (5 mg total) by mouth daily.   CALCIUM CITRATE + D PO Take 1 tablet by mouth daily.   Capsaicin 0.033 % Crea Apply to affected areas for pain relief up to twice a day.   cetirizine 10 MG tablet Commonly known as: ZYRTEC Take 10 mg by mouth daily.   cholecalciferol 1000 units tablet  Commonly known as: VITAMIN D Take 1,000 Units by mouth daily.   Fish Oil 1000 MG Caps Take 1,000 mg by mouth daily.   furosemide 40 MG tablet Commonly known as: LASIX Take 40 mg by mouth daily.   gabapentin 300 MG capsule Commonly known as: NEURONTIN Take 300 mg by mouth at bedtime.   levothyroxine 50 MCG tablet Commonly known as: SYNTHROID Take 50 mcg by mouth daily.   lidocaine 5 % Commonly known as: LIDODERM Place 1 patch  onto the skin daily. Remove & Discard patch within 12 hours or as directed by MD   pantoprazole 40 MG tablet Commonly known as: PROTONIX Take 40 mg by mouth daily.   polyethylene glycol powder 17 GM/SCOOP powder Commonly known as: GLYCOLAX/MIRALAX Take 0.5 Containers by mouth daily. What changed: Another medication with the same name was added. Make sure you understand how and when to take each.   polyethylene glycol 17 g packet Commonly known as: MIRALAX / GLYCOLAX Take 17 g by mouth 2 (two) times daily. What changed: You were already taking a medication with the same name, and this prescription was added. Make sure you understand how and when to take each.   potassium chloride SA 20 MEQ tablet Commonly known as: KLOR-CON Take 20 mEq by mouth daily.   pravastatin 40 MG tablet Commonly known as: PRAVACHOL Take 40 mg by mouth daily.   PRESERVISION AREDS PO Take 1 capsule by mouth 2 (two) times daily.   senna-docusate 8.6-50 MG tablet Commonly known as: Senokot-S Take 1 tablet by mouth at bedtime.   torsemide 20 MG tablet Commonly known as: DEMADEX Take 20 mg by mouth 2 (two) times daily.   traZODone 50 MG tablet Commonly known as: DESYREL Take 1 tablet (50 mg total) by mouth at bedtime as needed for sleep.        No Known Allergies  Consultations: GI Palliative Care   Procedures/Studies: CT ABDOMEN PELVIS WO CONTRAST  Result Date: 11/28/2020 CLINICAL DATA:  Acute, nonlocalized abdominal pain. Vomiting. Altered mental status EXAM: CT ABDOMEN AND PELVIS WITHOUT CONTRAST TECHNIQUE: Multidetector CT imaging of the abdomen and pelvis was performed following the standard protocol without IV contrast. COMPARISON:  None. FINDINGS: Lower chest: The visualized lung bases are clear bilaterally. Mild multi-vessel coronary artery calcification. Global cardiac size within normal limits. Hepatobiliary: There is mild intrahepatic and moderate extrahepatic biliary ductal dilation.  There is probable periportal edema identified best appreciated within segment 2 and segment 7 of the liver peripherally. The gallbladder is distended and there is minimal infiltration of the pericholecystic fat which may be inflammatory, but is nonspecific given the presence of a trace perihepatic ascites. No definite focal intrahepatic mass identified. Pancreas: There is asymmetric thickening of the head of the pancreas, not well assessed on this noncontrast examination. An underlying pancreatic mass or focal inflammatory change could appear in this fashion. Spleen: Unremarkable Adrenals/Urinary Tract: The adrenal glands are unremarkable. The kidneys are normal in size and position. Simple endophytic cortical cyst noted within the interpolar region of the left kidney. Indeterminate 14 mm cystic lesion arises from the upper pole of the right kidney, not well assessed on this noncontrast examination. The kidneys are otherwise unremarkable. The bladder is unremarkable peer Stomach/Bowel: There is omental nodularity and infiltration most in keeping with peritoneal carcinomatosis. Trace ascites noted. The stomach, small bowel, and large bowel are otherwise unremarkable. The appendix is not clearly identified on this examination. No free intraperitoneal gas. Vascular/Lymphatic: Moderate aortoiliac atherosclerotic calcification.  No aortic aneurysm. No pathologic adenopathy within the abdomen and pelvis. Reproductive: Uterus and bilateral adnexa are unremarkable. Other: No abdominal wall hernia identified. Musculoskeletal: No acute bone abnormality. Osseous structures are age-appropriate. No lytic or blastic bone lesions identified. IMPRESSION: Technically limited examination in absence of contrast administration. Asymmetric thickening of the a head of the pancreas concerning for an underlying pancreatic malignancy given the associated peritoneal nodularity and infiltration suggestive of peritoneal carcinomatosis. This is  not well assessed on this examination and dedicated pancreatic protocol CT or MRI examination recommended for further evaluation. Mild to moderate intra and extrahepatic biliary ductal dilation and distension of the gallbladder. Suggestion of a peripheral periportal edema. This may reflect obstruction related to the process within the head of the pancreas. An intrinsic stricture or inflammatory process, however, could appear similarly. Correlation with liver enzymes is recommended. Dedicated sonography may also be helpful to exclude a primary inflammatory process of the gallbladder. Indeterminate 14 mm cystic lesion within the upper pole the right kidney. This could be simultaneously assessed with MRI examination of the pancreas and liver. Aortic Atherosclerosis (ICD10-I70.0). Electronically Signed   By: Fidela Salisbury MD   On: 11/28/2020 00:19   MR ABDOMEN MRCP WO CONTRAST  Result Date: 11/29/2020 CLINICAL DATA:  RIGHT upper quadrant abdominal pain, nondiagnostic ultrasound. EXAM: MRI ABDOMEN WITHOUT CONTRAST  (INCLUDING MRCP) TECHNIQUE: Multiplanar multisequence MR imaging of the abdomen was performed. Heavily T2-weighted images of the biliary and pancreatic ducts were obtained, and three-dimensional MRCP images were rendered by post processing. COMPARISON:  Abdominal sonogram November 28, 2020. CT of the abdomen and pelvis of November 27, 2020. FINDINGS: Lower chest: Trace effusions.  No consolidation. Hepatobiliary: Marked distension of the gallbladder. Abrupt termination of the biliary tree at the level of a pancreatic mass. Sludge in the dependent gallbladder. Variable signal in the hepatic parenchyma in hepatic subsegment VIII with some segmental biliary duct dilation is nonspecific. Pericholecystic stranding and edema in the setting of ascites and peritoneal disease without significant gallbladder wall thickening. There is generalized edema throughout the abdomen. Generalized edema is noted throughout the  retroperitoneum and there is small volume ascites. Pancreas: Minimal intrinsic T1 signal remaining in the pancreas in the pancreatic head. The large pancreatic mass, ill-defined and invading/encasing surrounding structures. Limited assessment due to lack of contrast. Pilar Plate in case mint of the celiac is noted. Mass tracking into hepatic duodenal ligament with peripheral ductal dilation atrophy may measure as much as 8 x 3.4 cm. Common bile duct obstructed at the upper margin of the mass. Peripheral pancreatic duct also with dilation peripherally. Masslike area contacting the posterior stomach. Restricted diffusion within peritoneal nodularity that was seen on the previous study for instance on image 27 of series 8 a 13 mm nodule in the anterior peritoneum. Adenopathy in the region of the RIGHT diaphragmatic crus (image 17/8) 12 mm. Spleen:  Normal spleen. Adrenals/Urinary Tract:  Adrenal glands are normal. Cyst in the upper pole the RIGHT kidney and renal sinus cysts on the LEFT. No hydronephrosis. Stomach/Bowel: No acute gastrointestinal process to the extent evaluated on MR. Suspected tumor contacts posterior wall the stomach. Vascular/Lymphatic: Vascular involvement in the upper abdomen with frank involvement of the celiac and its branches best seen on image 18 and 19 of series 8. Other: Signs of ascites as seen on recent CT of the abdomen and pelvis. Peritoneal nodularity also as outlined above. Musculoskeletal: No suspicious bone lesions identified. IMPRESSION: Positive for infiltrative pancreatic neoplasm vascular involvement, peritoneal disease and upper  abdominal nodal enlargement. Biliary obstruction secondary to large mass with continued distension of the gallbladder without significant wall thickening. Lack of contrast limiting assessment. No specific findings of cholecystitis with generalized edema in the upper abdomen and ascites/peritoneal disease. Area of signal abnormality in segmental biliary duct  distension potentially related to cholangitis. Underlying small hepatic lesion could lead to this appearance as well, attention on follow-up. Electronically Signed   By: Zetta Bills M.D.   On: 11/29/2020 10:15   MR 3D Recon At Scanner  Result Date: 11/29/2020 CLINICAL DATA:  RIGHT upper quadrant abdominal pain, nondiagnostic ultrasound. EXAM: MRI ABDOMEN WITHOUT CONTRAST  (INCLUDING MRCP) TECHNIQUE: Multiplanar multisequence MR imaging of the abdomen was performed. Heavily T2-weighted images of the biliary and pancreatic ducts were obtained, and three-dimensional MRCP images were rendered by post processing. COMPARISON:  Abdominal sonogram November 28, 2020. CT of the abdomen and pelvis of November 27, 2020. FINDINGS: Lower chest: Trace effusions.  No consolidation. Hepatobiliary: Marked distension of the gallbladder. Abrupt termination of the biliary tree at the level of a pancreatic mass. Sludge in the dependent gallbladder. Variable signal in the hepatic parenchyma in hepatic subsegment VIII with some segmental biliary duct dilation is nonspecific. Pericholecystic stranding and edema in the setting of ascites and peritoneal disease without significant gallbladder wall thickening. There is generalized edema throughout the abdomen. Generalized edema is noted throughout the retroperitoneum and there is small volume ascites. Pancreas: Minimal intrinsic T1 signal remaining in the pancreas in the pancreatic head. The large pancreatic mass, ill-defined and invading/encasing surrounding structures. Limited assessment due to lack of contrast. Pilar Plate in case mint of the celiac is noted. Mass tracking into hepatic duodenal ligament with peripheral ductal dilation atrophy may measure as much as 8 x 3.4 cm. Common bile duct obstructed at the upper margin of the mass. Peripheral pancreatic duct also with dilation peripherally. Masslike area contacting the posterior stomach. Restricted diffusion within peritoneal nodularity that  was seen on the previous study for instance on image 27 of series 8 a 13 mm nodule in the anterior peritoneum. Adenopathy in the region of the RIGHT diaphragmatic crus (image 17/8) 12 mm. Spleen:  Normal spleen. Adrenals/Urinary Tract:  Adrenal glands are normal. Cyst in the upper pole the RIGHT kidney and renal sinus cysts on the LEFT. No hydronephrosis. Stomach/Bowel: No acute gastrointestinal process to the extent evaluated on MR. Suspected tumor contacts posterior wall the stomach. Vascular/Lymphatic: Vascular involvement in the upper abdomen with frank involvement of the celiac and its branches best seen on image 18 and 19 of series 8. Other: Signs of ascites as seen on recent CT of the abdomen and pelvis. Peritoneal nodularity also as outlined above. Musculoskeletal: No suspicious bone lesions identified. IMPRESSION: Positive for infiltrative pancreatic neoplasm vascular involvement, peritoneal disease and upper abdominal nodal enlargement. Biliary obstruction secondary to large mass with continued distension of the gallbladder without significant wall thickening. Lack of contrast limiting assessment. No specific findings of cholecystitis with generalized edema in the upper abdomen and ascites/peritoneal disease. Area of signal abnormality in segmental biliary duct distension potentially related to cholangitis. Underlying small hepatic lesion could lead to this appearance as well, attention on follow-up. Electronically Signed   By: Zetta Bills M.D.   On: 11/29/2020 10:15   DG Chest Portable 1 View  Result Date: 11/27/2020 CLINICAL DATA:  Status post fall. EXAM: PORTABLE CHEST 1 VIEW COMPARISON:  May 11, 2017 FINDINGS: Chronic appearing increased lung markings are seen without evidence of acute infiltrate, pleural effusion  or pneumothorax. Stable linear scarring is seen within the right lung base. The heart size and mediastinal contours are within normal limits. Marked severity calcification of the  thoracic aorta is noted. Degenerative changes seen throughout the thoracic spine. IMPRESSION: Stable exam without acute or active cardiopulmonary disease. Electronically Signed   By: Virgina Norfolk M.D.   On: 11/27/2020 20:37   DG ERCP  Result Date: 11/30/2020 CLINICAL DATA:  Pancreatic mass with suspected malignant obstructed jaundice EXAM: ERCP TECHNIQUE: Multiple spot images obtained with the fluoroscopic device and submitted for interpretation post-procedure. FLUOROSCOPY TIME:  Fluoroscopy Time:  1 minutes 18 seconds Number of Acquired Spot Images: 0 COMPARISON:  MRCP 11/29/2020 FINDINGS: A total of 7 intraoperative saved images are submitted for review. The images demonstrate a flexible duodenal scope in the descending duodenum with wire cannulation of the intrahepatic ducts. Cholangiography demonstrates marked dilation of the extrahepatic biliary tree with focal high-grade stenosis versus occlusion of the distal most common bile duct. Final image demonstrates placement of a self expanding metallic biliary stent. IMPRESSION: 1. High-grade stenosis versus occlusion of the distal most common bile duct. 2. ERCP with placement of a self expanding metallic biliary stent. These images were submitted for radiologic interpretation only. Please see the procedural report for the amount of contrast and the fluoroscopy time utilized. Electronically Signed   By: Jacqulynn Cadet M.D.   On: 11/30/2020 15:26   US Abdomen Limited RUQ (LIVER/GB)  Result Date: 11/28/2020 CLINICAL DATA:  Abnormal CT.  Follow-up exam. EXAM: ULTRASOUND ABDOMEN LIMITED RIGHT UPPER QUADRANT COMPARISON:  CT, 11/27/2020. FINDINGS: Gallbladder: Gallbladder is distended. No shadowing stones. No gallbladder wall thickening or pericholecystic fluid. Common bile duct: Diameter: Dilated to 1.1 cm, mid to distal portion. No visualized stone. Liver: Heterogeneous echogenicity. Normal size. No mass or focal lesion. Portal vein is patent on color  Doppler imaging with normal direction of blood flow towards the liver. Other: None. IMPRESSION: 1. Dilated mid to distal common bile duct without evidence of a duct stone. CT findings suspicious for a pancreatic head mass. This would be best assessed with pancreatic MRI without and with contrast, alternatively pancreatic protocol CT with contrast. 2. Heterogeneous echogenicity of the liver without a defined mass. 3. Distended gallbladder, but no stones or evidence of acute cholecystitis. Electronically Signed   By: Lajean Manes M.D.   On: 11/28/2020 10:19     Discharge Exam: Vitals:   11/30/20 2203 12/01/20 0531  BP: (!) 151/77 (!) 152/77  Pulse: 68 68  Resp: 16   Temp: 97.9 F (36.6 C) 98.4 F (36.9 C)  SpO2: 94% 96%   Vitals:   11/30/20 1600 11/30/20 1615 11/30/20 2203 12/01/20 0531  BP: (!) 187/88 (!) 182/73 (!) 151/77 (!) 152/77  Pulse: 63 62 68 68  Resp: 17 14 16    Temp:   97.9 F (36.6 C) 98.4 F (36.9 C)  TempSrc:   Oral Oral  SpO2: 97% 96% 94% 96%  Weight:      Height:        General: Pt is alert, awake, not in acute distress Cardiovascular: RRR, S1/S2 +, no rubs, no gallops Respiratory: CTA bilaterally, no wheezing, no rhonchi Abdominal: Soft, NT, ND, bowel sounds + Extremities: no edema, no cyanosis    The results of significant diagnostics from this hospitalization (including imaging, microbiology, ancillary and laboratory) are listed below for reference.     Microbiology: Recent Results (from the past 240 hour(s))  Resp Panel by RT-PCR (Flu A&B, Covid) Nasopharyngeal  Swab     Status: None   Collection Time: 11/27/20  7:20 PM   Specimen: Nasopharyngeal Swab; Nasopharyngeal(NP) swabs in vial transport medium  Result Value Ref Range Status   SARS Coronavirus 2 by RT PCR NEGATIVE NEGATIVE Final    Comment: (NOTE) SARS-CoV-2 target nucleic acids are NOT DETECTED.  The SARS-CoV-2 RNA is generally detectable in upper respiratory specimens during the acute phase  of infection. The lowest concentration of SARS-CoV-2 viral copies this assay can detect is 138 copies/mL. A negative result does not preclude SARS-Cov-2 infection and should not be used as the sole basis for treatment or other patient management decisions. A negative result may occur with  improper specimen collection/handling, submission of specimen other than nasopharyngeal swab, presence of viral mutation(s) within the areas targeted by this assay, and inadequate number of viral copies(<138 copies/mL). A negative result must be combined with clinical observations, patient history, and epidemiological information. The expected result is Negative.  Fact Sheet for Patients:  EntrepreneurPulse.com.au  Fact Sheet for Healthcare Providers:  IncredibleEmployment.be  This test is no t yet approved or cleared by the [place] FDA and  has been authorized for detection and/or diagnosis of SARS-CoV-2 by FDA under an Emergency Use Authorization (EUA). This EUA will remain  in effect (meaning this test can be used) for the duration of the COVID-19 declaration under Section 564(b)(1) of the Act, 21 U.S.C.section 360bbb-3(b)(1), unless the authorization is terminated  or revoked sooner.       Influenza A by PCR NEGATIVE NEGATIVE Final   Influenza B by PCR NEGATIVE NEGATIVE Final    Comment: (NOTE) The Xpert Xpress SARS-CoV-2/FLU/RSV plus assay is intended as an aid in the diagnosis of influenza from Nasopharyngeal swab specimens and should not be used as a sole basis for treatment. Nasal washings and aspirates are unacceptable for Xpert Xpress SARS-CoV-2/FLU/RSV testing.  Fact Sheet for Patients: EntrepreneurPulse.com.au  Fact Sheet for Healthcare Providers: IncredibleEmployment.be  This test is not yet approved or cleared by the [place] FDA and has been authorized for detection and/or diagnosis of  SARS-CoV-2 by FDA under an Emergency Use Authorization (EUA). This EUA will remain in effect (meaning this test can be used) for the duration of the COVID-19 declaration under Section 564(b)(1) of the Act, 21 U.S.C. section 360bbb-3(b)(1), unless the authorization is terminated or revoked.  Performed at [facility], [address] [place], [place] [address]   Urine culture     Status: None   Collection Time: 11/27/20  9:00 PM   Specimen: Urine, Catheterized  Result Value Ref Range Status   Specimen Description   Final    URINE, CATHETERIZED Performed at [facility], [address] [place], [place] [address]    Special Requests   Final    NONE Performed at [facility], [address] [place], [place] [address]    Culture   Final    NO GROWTH Performed at [facility], [place] [address] [place], [place] [address]    Report Status 11/29/2020 FINAL  Final     Labs: BNP (last 3 results) No results for input(s): BNP in the last 8760 hours. Basic Metabolic Panel: Recent Labs  Lab 11/27/20 1905 11/28/20 0519 11/29/20 0555 11/30/20 0532 12/01/20 0611  NA 135 136 136 138 137  K 3.2* 4.0 3.6 3.3* 4.2  CL 100 102 101 106 105  CO2 25 27 27 26 25   GLUCOSE 195* 135* 114* 106* 129*  BUN 19 19 17  14 15  CREATININE 1.17* 1.18* 1.20* 1.16* 1.08*  CALCIUM 8.9 8.5* 8.4* 8.5* 8.6*  MG  --   --  2.3 2.3 2.3   Liver Function Tests: Recent Labs  Lab 11/27/20 1905 11/28/20 0519 11/29/20 0555 11/30/20 0532 12/01/20 0611  AST 41 39 52* 51* 50*  ALT 34 33 43 47* 49*  ALKPHOS 141* 112 127* 148* 186*  BILITOT 1.0 0.6 0.7 0.9 0.6  PROT 6.1* 5.4* 5.5* 5.4* 5.6*  ALBUMIN 3.6 2.9* 2.8* 2.8* 2.8*   Recent Labs  Lab 11/27/20 1905  LIPASE 30   No results for input(s): AMMONIA in the last 168 hours. CBC: Recent Labs  Lab 11/27/20 1905 11/28/20 0519 11/29/20 0555 11/30/20 0532 12/01/20 0611  WBC 12.0* 10.7* 7.8 6.1 7.1  NEUTROABS 10.7*  --   --   --   --   HGB 13.2  11.6* 11.9* 11.5* 12.6  HCT 39.2 34.4* 35.8* 34.7* 37.6  MCV 100.3* 102.4* 102.3* 100.3* 100.8*  PLT 254 242 251 243 321   Cardiac Enzymes: Recent Labs  Lab 11/27/20 2105  CKTOTAL 57   BNP: Invalid input(s): POCBNP CBG: No results for input(s): GLUCAP in the last 168 hours. D-Dimer No results for input(s): DDIMER in the last 72 hours. Hgb A1c No results for input(s): HGBA1C in the last 72 hours. Lipid Profile No results for input(s): CHOL, HDL, LDLCALC, TRIG, CHOLHDL, LDLDIRECT in the last 72 hours. Thyroid function studies No results for input(s): TSH, T4TOTAL, T3FREE, THYROIDAB in the last 72 hours.  Invalid input(s): FREET3 Anemia work up No results for input(s): VITAMINB12, FOLATE, FERRITIN, TIBC, IRON, RETICCTPCT in the last 72 hours. Urinalysis    Component Value Date/Time   COLORURINE YELLOW 11/27/2020 2100   APPEARANCEUR CLEAR 11/27/2020 2100   LABSPEC 1.011 11/27/2020 2100   PHURINE 5.0 11/27/2020 2100   GLUCOSEU NEGATIVE 11/27/2020 2100   HGBUR NEGATIVE 11/27/2020 2100   BILIRUBINUR NEGATIVE 11/27/2020 2100   KETONESUR 5 (A) 11/27/2020 2100   PROTEINUR NEGATIVE 11/27/2020 2100   NITRITE NEGATIVE 11/27/2020 2100   LEUKOCYTESUR NEGATIVE 11/27/2020 2100   Sepsis Labs Invalid input(s): PROCALCITONIN,  WBC,  LACTICIDVEN Microbiology Recent Results (from the past 240 hour(s))  Resp Panel by RT-PCR (Flu A&B, Covid) Nasopharyngeal Swab     Status: None   Collection Time: 11/27/20  7:20 PM   Specimen: Nasopharyngeal Swab; Nasopharyngeal(NP) swabs in vial transport medium  Result Value Ref Range Status   SARS Coronavirus 2 by RT PCR NEGATIVE NEGATIVE Final    Comment: (NOTE) SARS-CoV-2 target nucleic acids are NOT DETECTED.  The SARS-CoV-2 RNA is generally detectable in upper respiratory specimens during the acute phase of infection. The lowest concentration of SARS-CoV-2 viral copies this assay can detect is 138 copies/mL. A negative result does not preclude  SARS-Cov-2 infection and should not be used as the sole basis for treatment or other patient management decisions. A negative result may occur with  improper specimen collection/handling, submission of specimen other than nasopharyngeal swab, presence of viral mutation(s) within the areas targeted by this assay, and inadequate number of viral copies(<138 copies/mL). A negative result must be combined with clinical observations, patient history, and epidemiological information. The expected result is Negative.  Fact Sheet for Patients:  EntrepreneurPulse.com.au  Fact Sheet for Healthcare Providers:  IncredibleEmployment.be  This test is no t yet approved or cleared by the [place] FDA and  has been authorized for detection and/or diagnosis of SARS-CoV-2 by FDA under an Emergency Use Authorization (  EUA). This EUA will remain  in effect (meaning this test can be used) for the duration of the COVID-19 declaration under Section 564(b)(1) of the Act, 21 U.S.C.section 360bbb-3(b)(1), unless the authorization is terminated  or revoked sooner.       Influenza A by PCR NEGATIVE NEGATIVE Final   Influenza B by PCR NEGATIVE NEGATIVE Final    Comment: (NOTE) The Xpert Xpress SARS-CoV-2/FLU/RSV plus assay is intended as an aid in the diagnosis of influenza from Nasopharyngeal swab specimens and should not be used as a sole basis for treatment. Nasal washings and aspirates are unacceptable for Xpert Xpress SARS-CoV-2/FLU/RSV testing.  Fact Sheet for Patients: EntrepreneurPulse.com.au  Fact Sheet for Healthcare Providers: IncredibleEmployment.be  This test is not yet approved or cleared by the [place] FDA and has been authorized for detection and/or diagnosis of SARS-CoV-2 by FDA under an Emergency Use Authorization (EUA). This EUA will remain in effect (meaning this test can be used) for the duration of  the COVID-19 declaration under Section 564(b)(1) of the Act, 21 U.S.C. section 360bbb-3(b)(1), unless the authorization is terminated or revoked.  Performed at [facility], [address], [place], [place] [address]   Urine culture     Status: None   Collection Time: 11/27/20  9:00 PM   Specimen: Urine, Catheterized  Result Value Ref Range Status   Specimen Description   Final    URINE, CATHETERIZED Performed at [facility], [address] [place], [place] [address]    Special Requests   Final    NONE Performed at [facility], [address] [place], [place] [address]    Culture   Final    NO GROWTH Performed at [facility], [place] [address] [place], [place] [address]    Report Status 11/29/2020 FINAL  Final     Time coordinating discharge: 35 minutes  SIGNED:   Rodena Goldmann, DO [facility] 12/01/2020, 11:02 AM  If 7PM-7AM, please contact night-coverage [email or web address]

## 2020-12-02 LAB — CYTOLOGY - NON PAP

## 2021-03-05 DEATH — deceased

## 2021-12-03 IMAGING — CT CT ABD-PELV W/O CM
2 of 4 series · 14 of 46 positions shown, 16 images · non-contrast
Comparison: None.

CLINICAL DATA: Acute, nonlocalized abdominal pain. Vomiting.
Altered mental status

EXAM:
CT ABDOMEN AND PELVIS WITHOUT CONTRAST
TECHNIQUE: Multidetector CT imaging of the abdomen and pelvis was performed
following the standard protocol without IV contrast.

[Series 2: axial st · axial · 0.77mm/px · z∈[+794,+1254]mm · 11 of 104 slices shown, 13 images]
[im 6/104  soft-tissue]
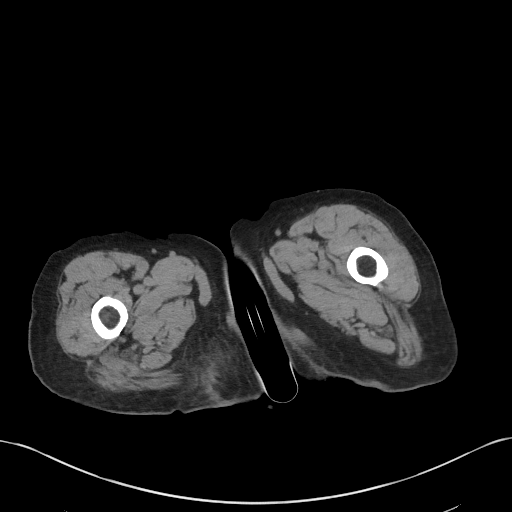
[im 6/104  bone]
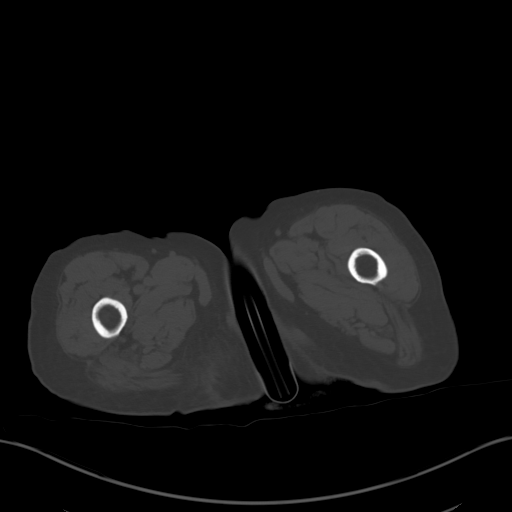
[im 18/104  soft-tissue]
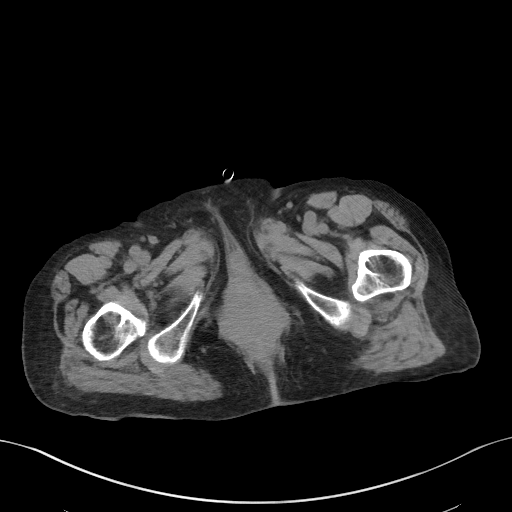
[im 23/104  soft-tissue]
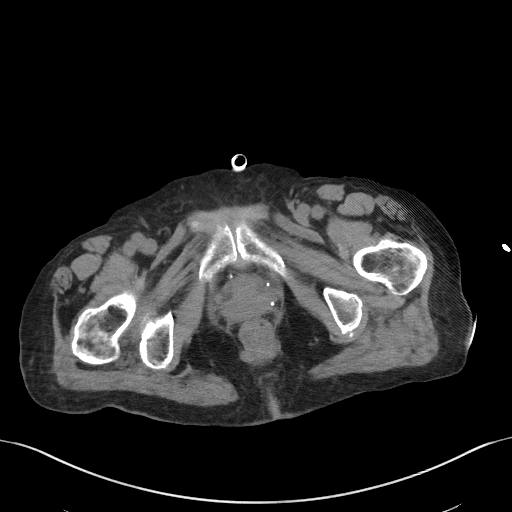
[im 35/104  soft-tissue]
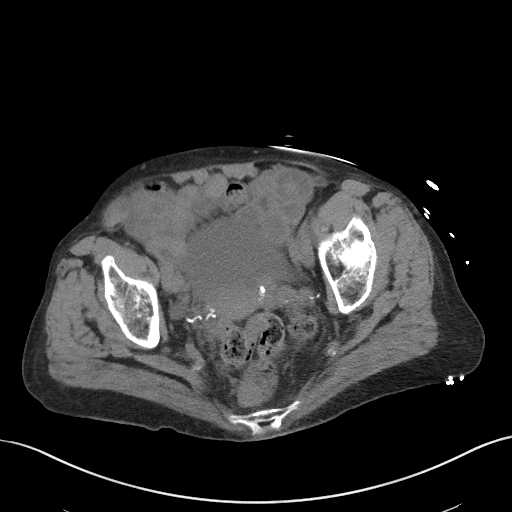
[im 41/104  soft-tissue]
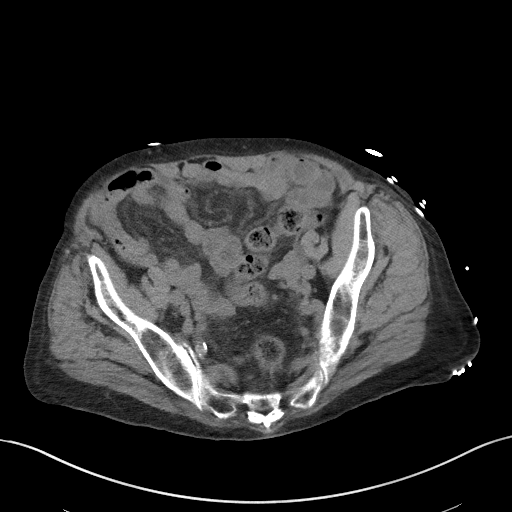
[im 52/104  soft-tissue]
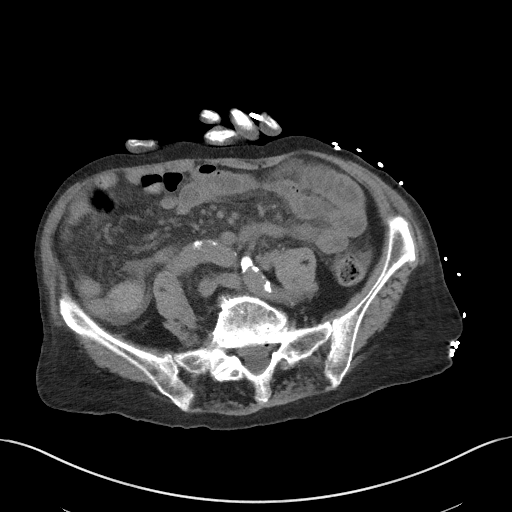
[im 63/104  soft-tissue]
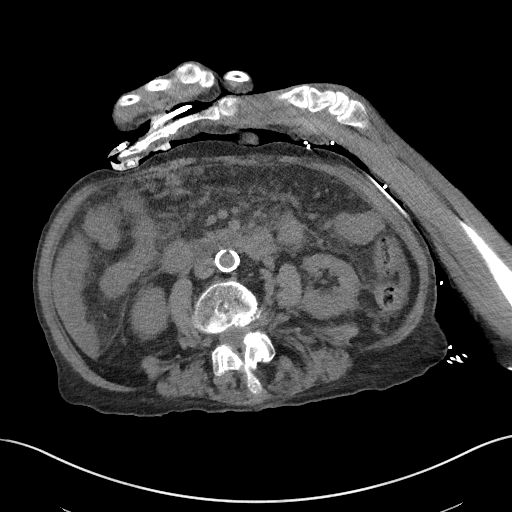
[im 69/104  soft-tissue]
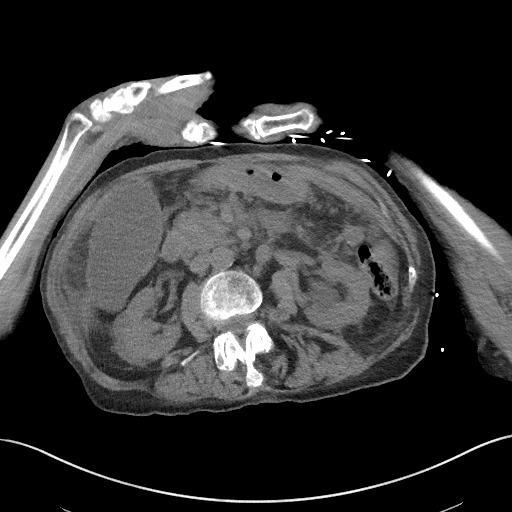
[im 81/104  soft-tissue]
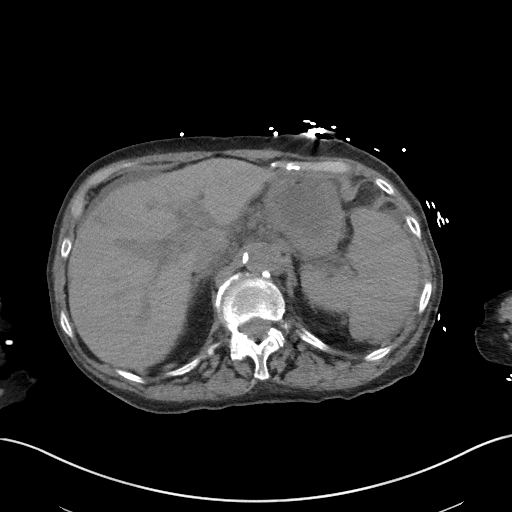
[im 81/104  bone]
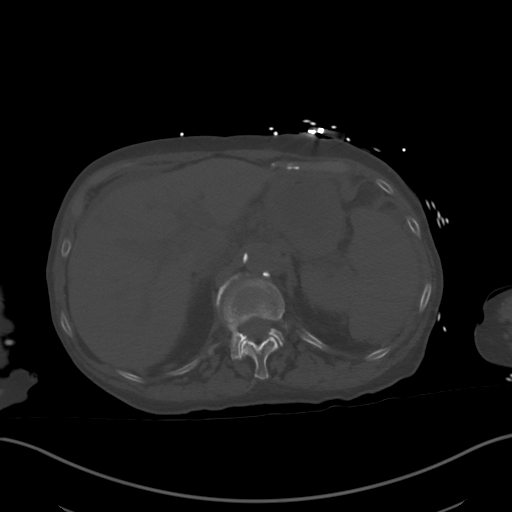
[im 86/104  soft-tissue]
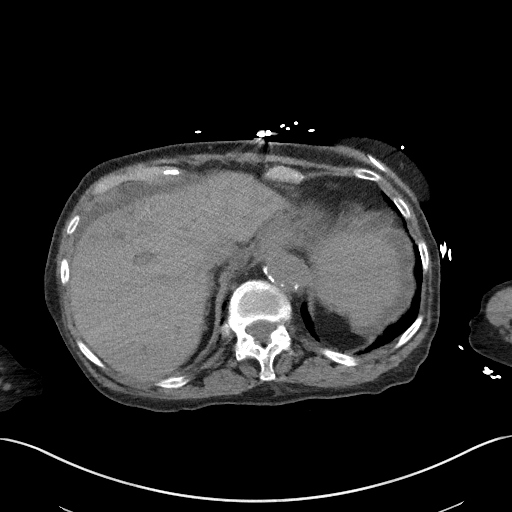
[im 98/104  soft-tissue]
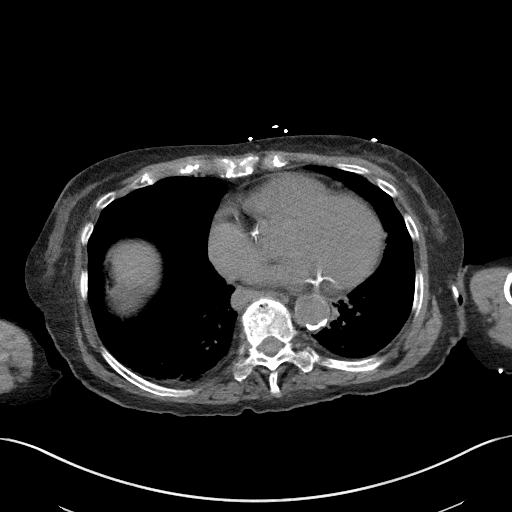

[Series 5: coronal st · coronal · 0.75mm/px · 3 of 108 slices shown]
[im 36/108  soft-tissue]
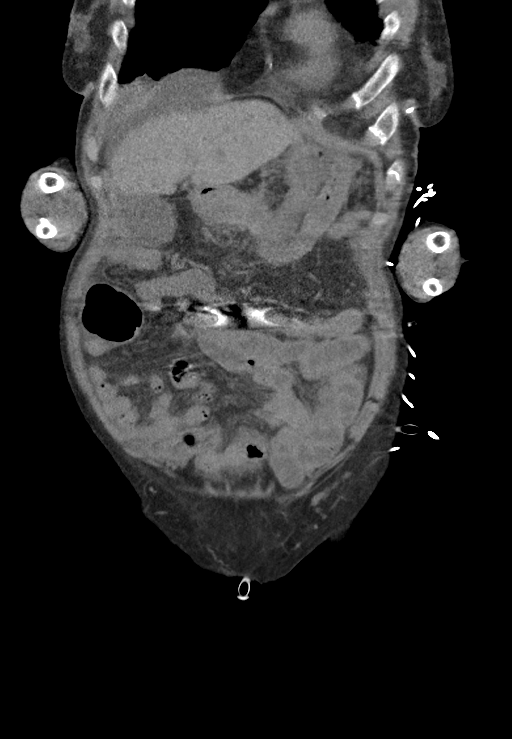
[im 48/108  soft-tissue]
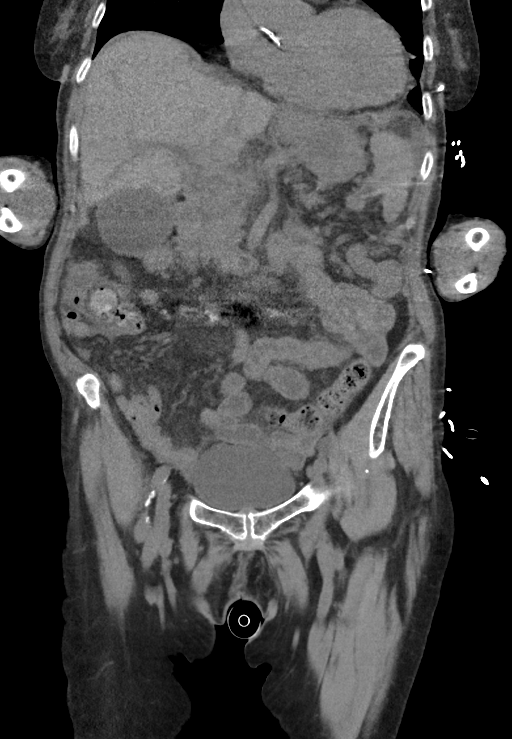
[im 60/108  soft-tissue]
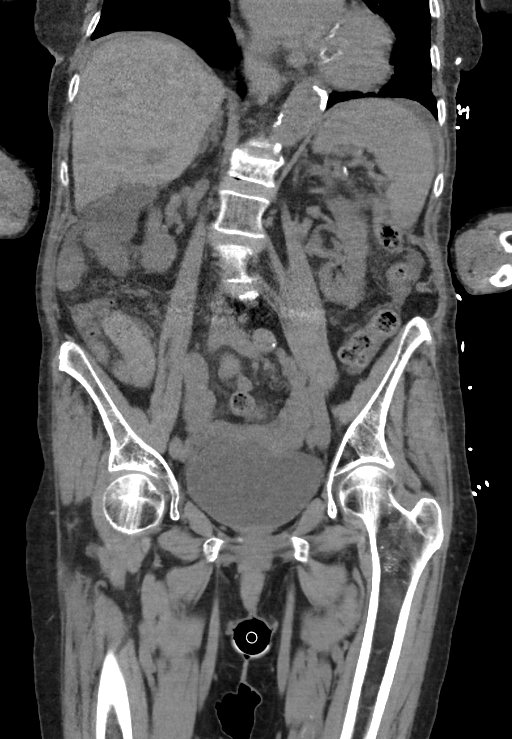

[14 of 46 positions shown; findings below may reference images not displayed]

FINDINGS: Lower chest: The visualized lung bases are clear bilaterally. Mild
multi-vessel coronary artery calcification. Global cardiac size
within normal limits.

Hepatobiliary: There is mild intrahepatic and moderate extrahepatic
biliary ductal dilation. There is probable periportal edema
identified best appreciated within segment 2 and segment 7 of the
liver peripherally. The gallbladder is distended and there is
minimal infiltration of the pericholecystic fat which may be
inflammatory, but is nonspecific given the presence of a trace
perihepatic ascites. No definite focal intrahepatic mass
identified.

Pancreas: There is asymmetric thickening of the head of the
pancreas, not well assessed on this noncontrast examination. An
underlying pancreatic mass or focal inflammatory change could appear
in this fashion.

Spleen: Unremarkable

Adrenals/Urinary Tract: The adrenal glands are unremarkable. The
kidneys are normal in size and position. Simple endophytic cortical
cyst noted within the interpolar region of the left kidney.
Indeterminate 14 mm cystic lesion arises from the upper pole of the
right kidney, not well assessed on this noncontrast examination. The
kidneys are otherwise unremarkable. The bladder is unremarkable peer

Stomach/Bowel: There is omental nodularity and infiltration most in
keeping with peritoneal carcinomatosis. Trace ascites noted. The
stomach, small bowel, and large bowel are otherwise unremarkable.
The appendix is not clearly identified on this examination. No free
intraperitoneal gas.

Vascular/Lymphatic: Moderate aortoiliac atherosclerotic
calcification. No aortic aneurysm. No pathologic adenopathy within
the abdomen and pelvis.

Reproductive: Uterus and bilateral adnexa are unremarkable.

Other: No abdominal wall hernia identified.

Musculoskeletal: No acute bone abnormality. Osseous structures are
age-appropriate. No lytic or blastic bone lesions identified.
IMPRESSION: Technically limited examination in absence of contrast
administration.

Asymmetric thickening of the a head of the pancreas concerning for
an underlying pancreatic malignancy given the associated peritoneal
nodularity and infiltration suggestive of peritoneal carcinomatosis.
This is not well assessed on this examination and dedicated
pancreatic protocol CT or MRI examination recommended for further
evaluation.

Mild to moderate intra and extrahepatic biliary ductal dilation and
distension of the gallbladder. Suggestion of a peripheral periportal
edema. This may reflect obstruction related to the process within
the head of the pancreas. An intrinsic stricture or inflammatory
process, however, could appear similarly. Correlation with liver
enzymes is recommended. Dedicated sonography may also be helpful to
exclude a primary inflammatory process of the gallbladder.

Indeterminate 14 mm cystic lesion within the upper pole the right
kidney. This could be simultaneously assessed with MRI examination
of the pancreas and liver.

Aortic Atherosclerosis ([id number]-J72.2).

## 2021-12-04 IMAGING — US US ABDOMEN LIMITED
1 series · 14 of 25 positions shown · non-contrast
Comparison: CT, 11/27/2020.

CLINICAL DATA: Abnormal CT.  Follow-up exam.

EXAM:
ULTRASOUND ABDOMEN LIMITED RIGHT UPPER QUADRANT

[Series 1: us abdomen limited ruq (liver/gb) · 14 of 74 slices shown]
[im 1/74]
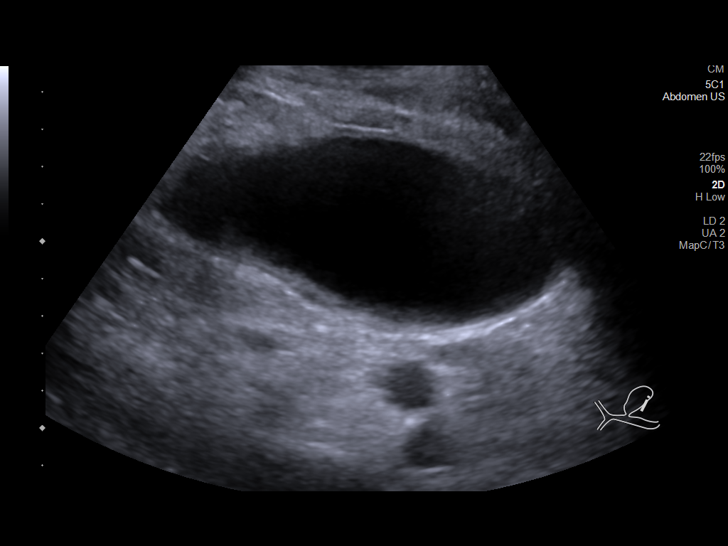
[im 7/74]
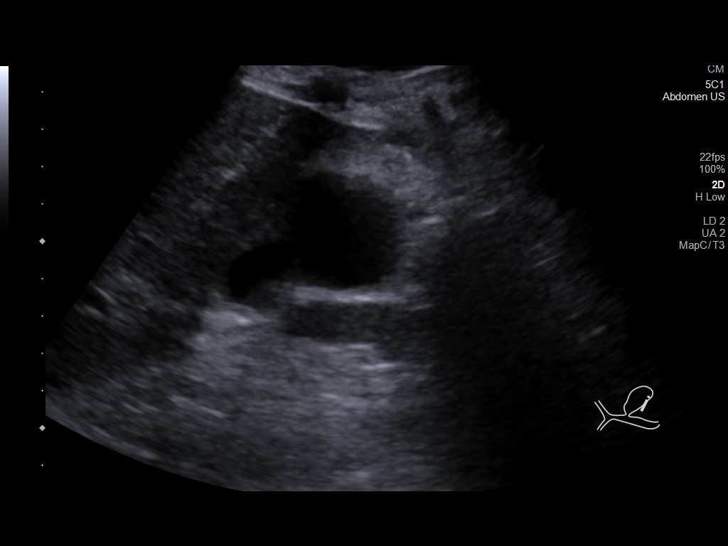
[im 13/74]
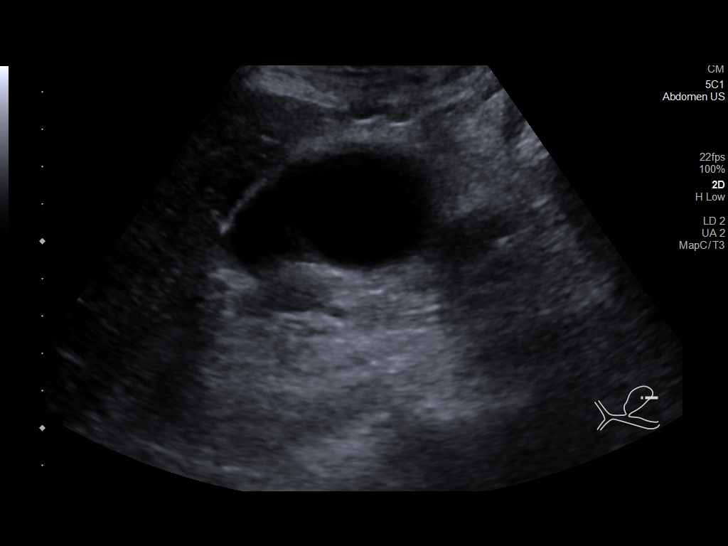
[im 19/74]
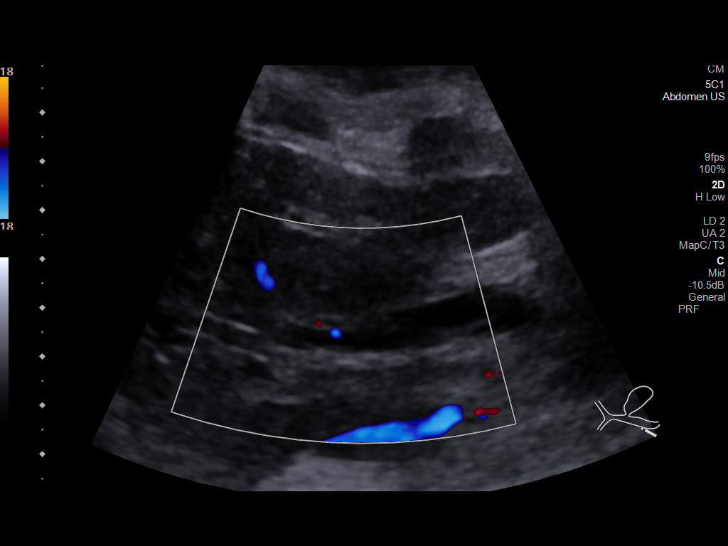
[im 25/74]
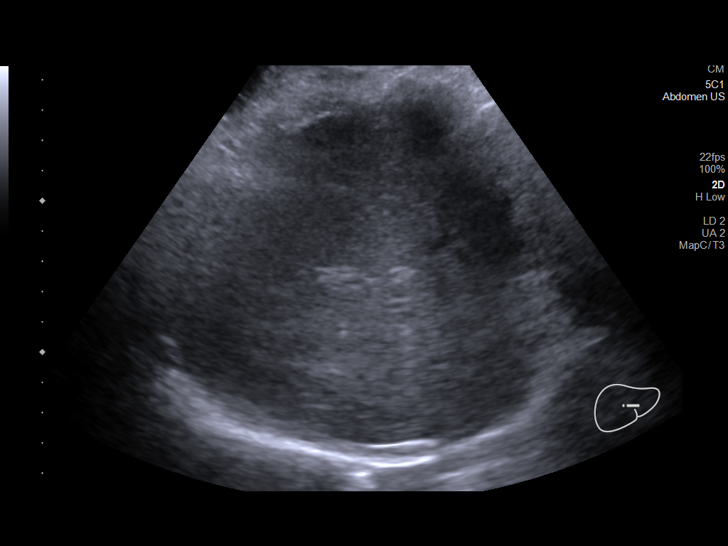
[im 28/74]
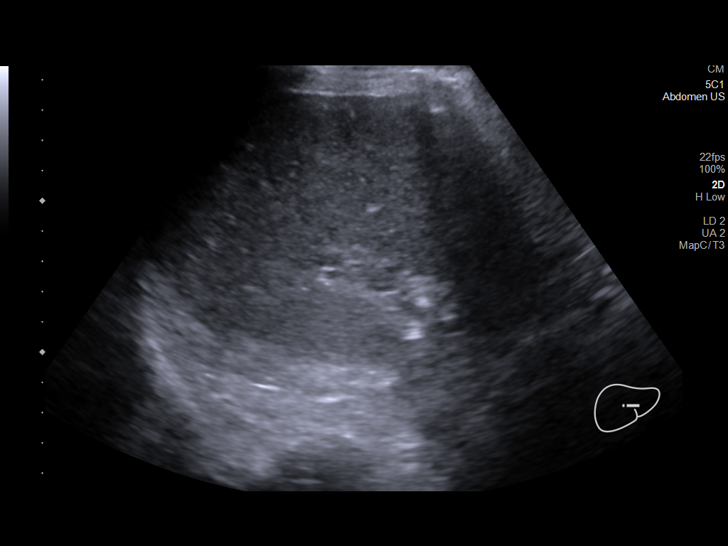
[im 34/74]
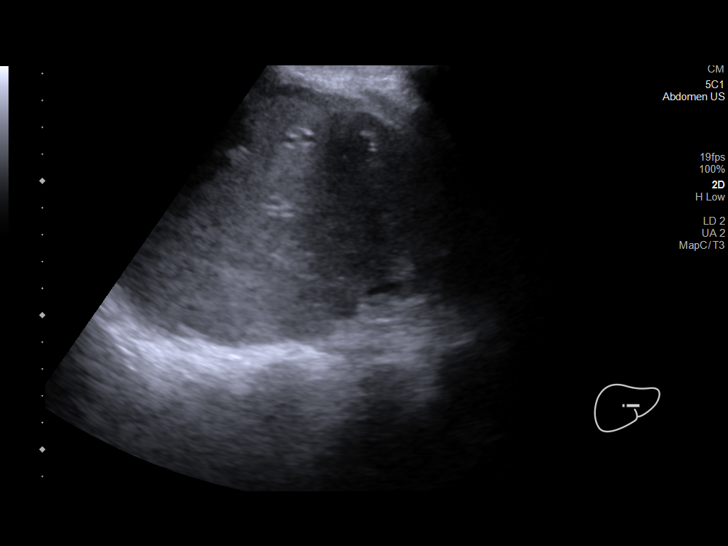
[im 40/74]
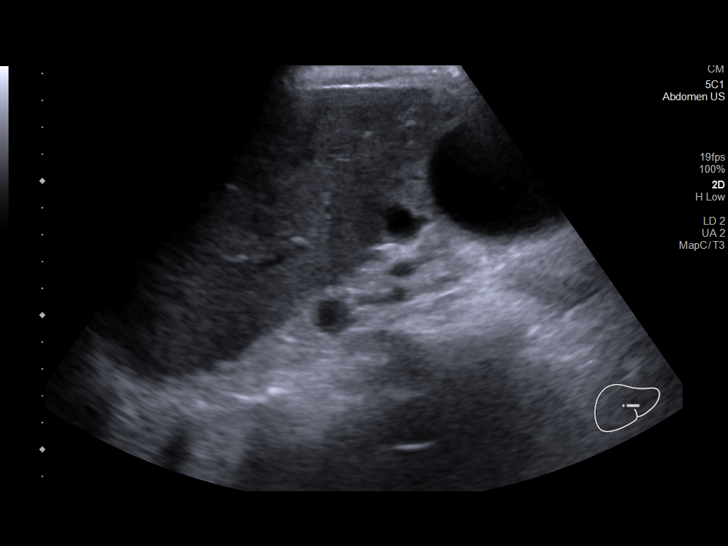
[im 46/74]
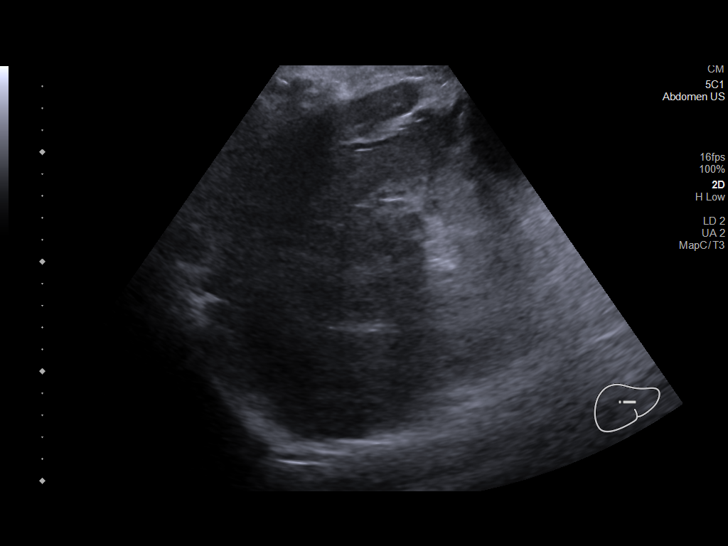
[im 49/74]
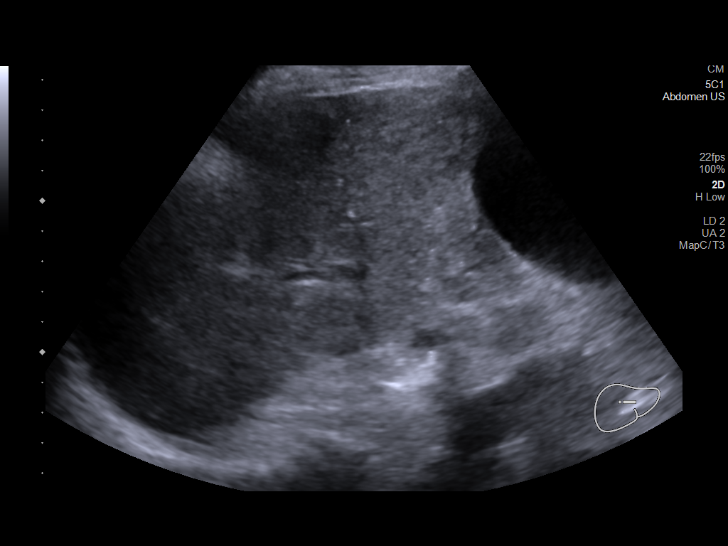
[im 55/74]
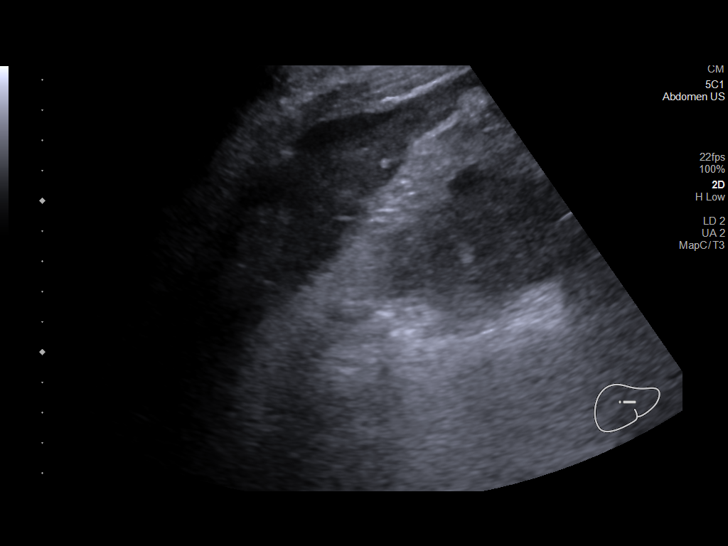
[im 61/74]
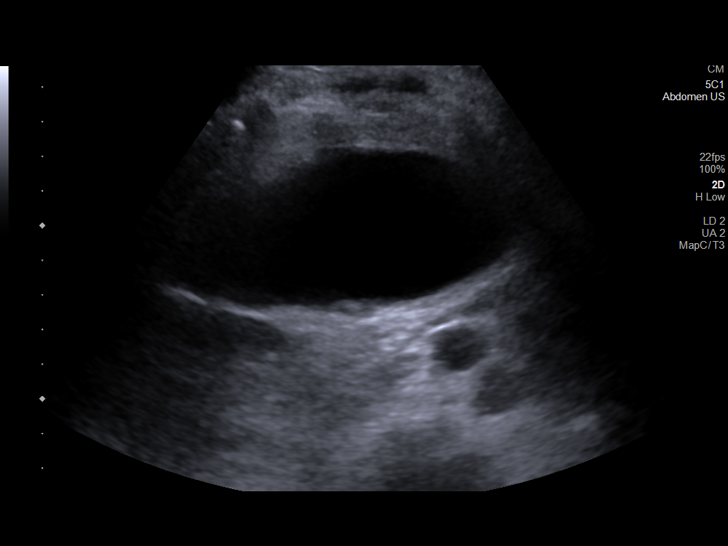
[im 67/74]
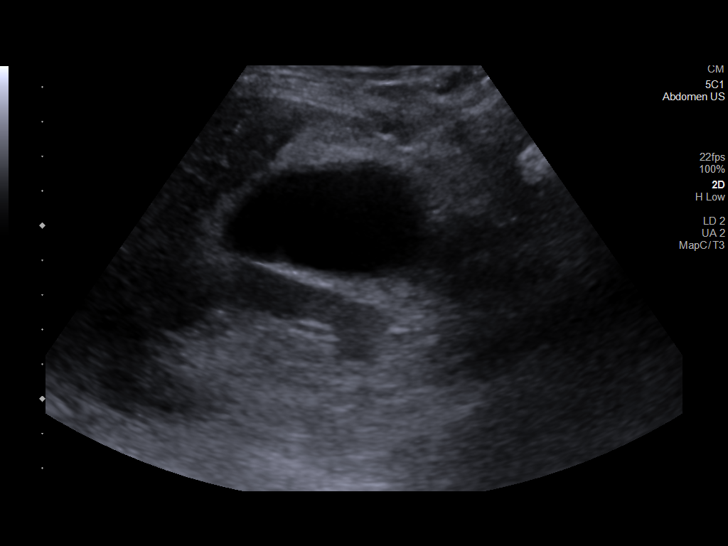
[im 74/74]
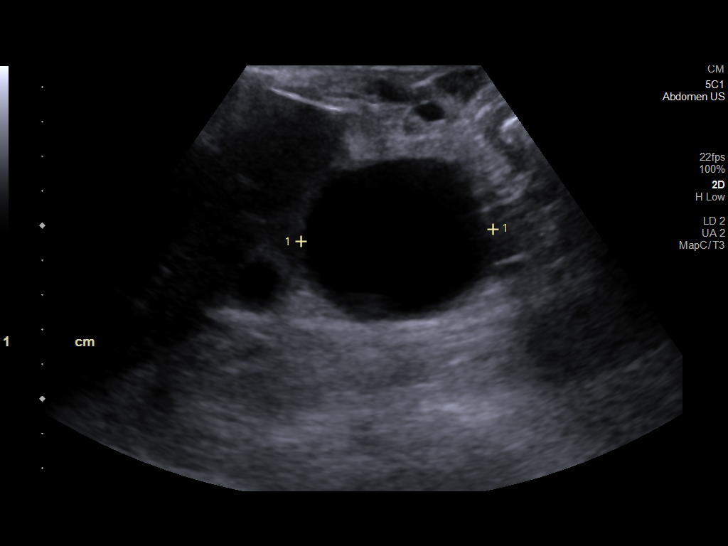

[14 of 25 positions shown; findings below may reference images not displayed]

FINDINGS: Gallbladder:

Gallbladder is distended. No shadowing stones. No gallbladder wall
thickening or pericholecystic fluid.

Common bile duct:

Diameter: Dilated to 1.1 cm, mid to distal portion. No visualized
stone.

Liver:

Heterogeneous echogenicity. Normal size. No mass or focal lesion.
Portal vein is patent on color Doppler imaging with normal direction
of blood flow towards the liver.

Other: None.
IMPRESSION: 1. Dilated mid to distal common bile duct without evidence of a duct
stone. CT findings suspicious for a pancreatic head mass. This would
be best assessed with pancreatic MRI without and with contrast,
alternatively pancreatic protocol CT with contrast.
2. Heterogeneous echogenicity of the liver without a defined mass.
3. Distended gallbladder, but no stones or evidence of acute
cholecystitis.

## 2021-12-06 IMAGING — XA DG ERCP WO/W SPHINCTEROTOMY
1 series · 15 of 20 positions shown · non-contrast
Comparison: MRCP 11/29/2020

CLINICAL DATA: Pancreatic mass with suspected malignant obstructed
jaundice

EXAM:
ERCP
TECHNIQUE: Multiple spot images obtained with the fluoroscopic device and
submitted for interpretation post-procedure.
FLUOROSCOPY TIME:  Fluoroscopy Time:  1 minutes 18 seconds
Number of Acquired Spot Images: 0

[Series 1: dg endo · 0.20mm/px · 7 acquisitions, 15 frames shown]
[im 1/7]
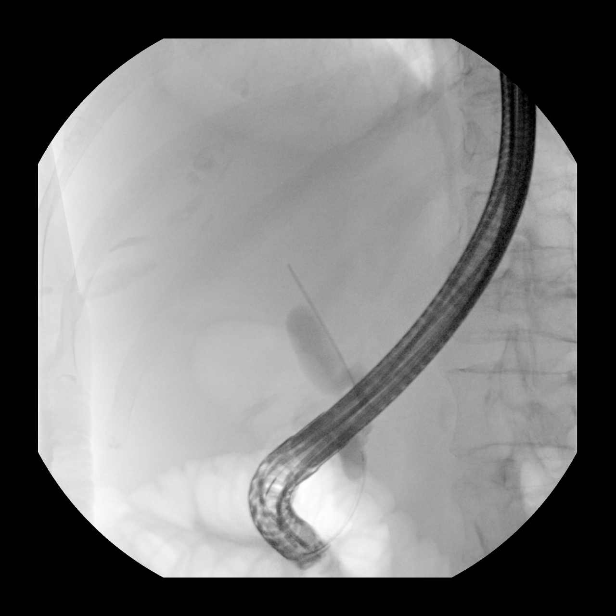
[im 1/7]
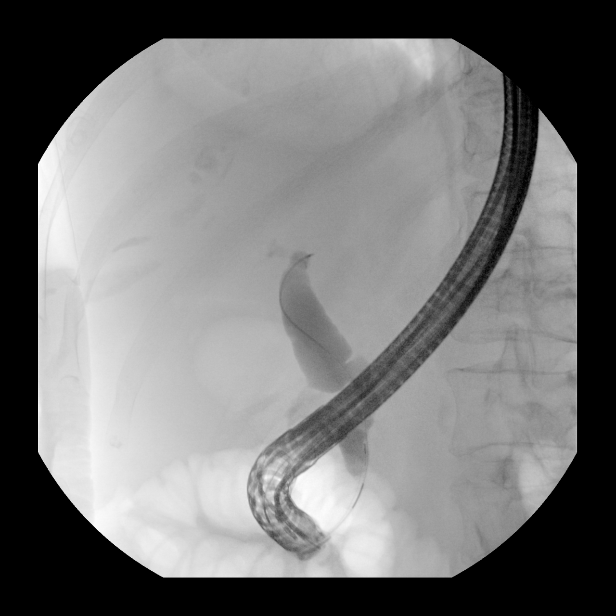
[im 1/7]
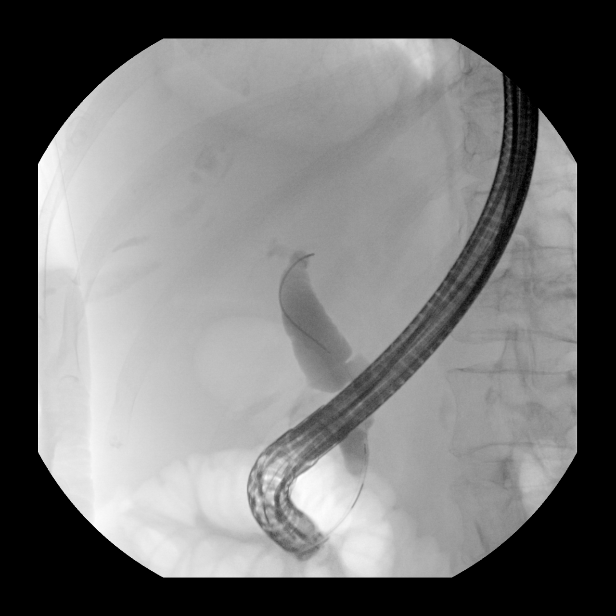
[im 2/7]
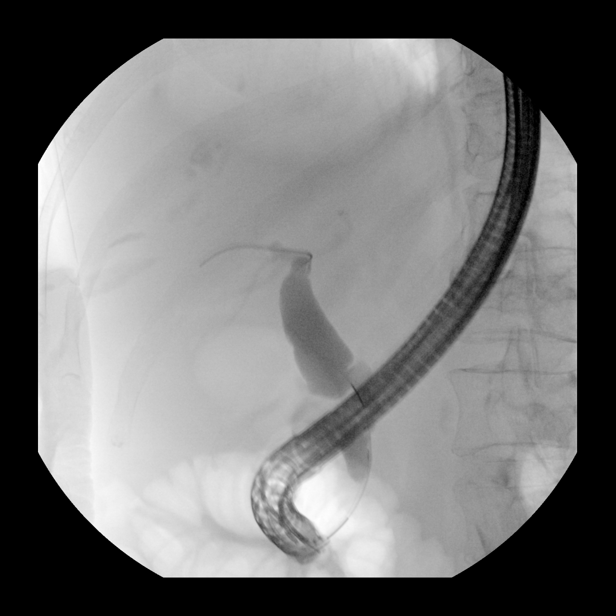
[im 2/7]
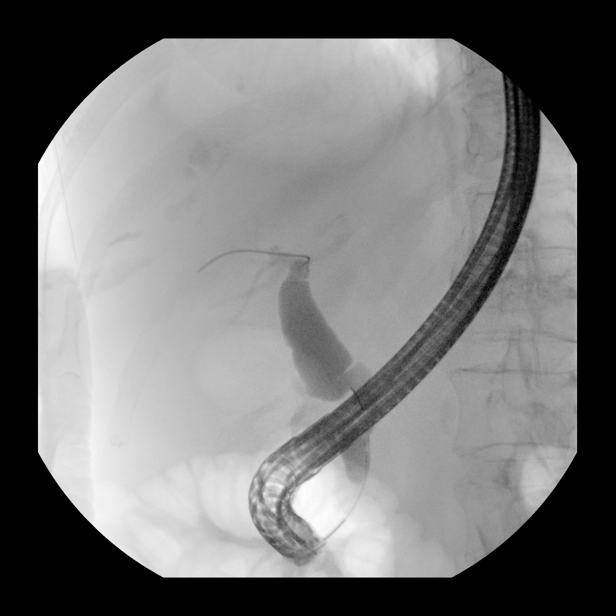
[im 2/7]
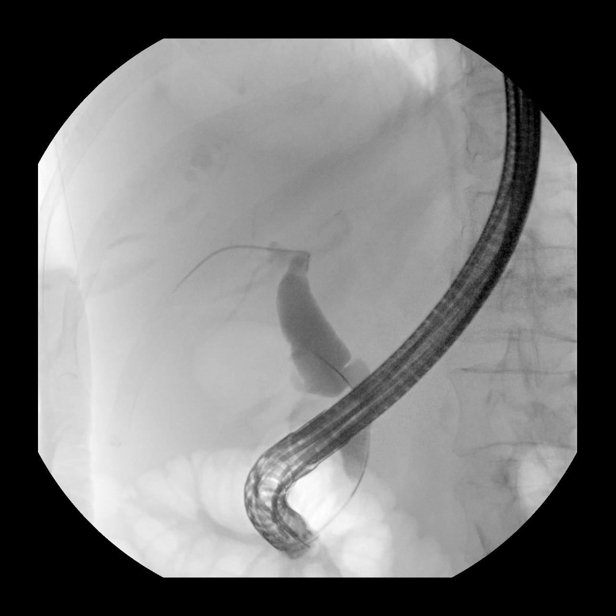
[im 3/7]
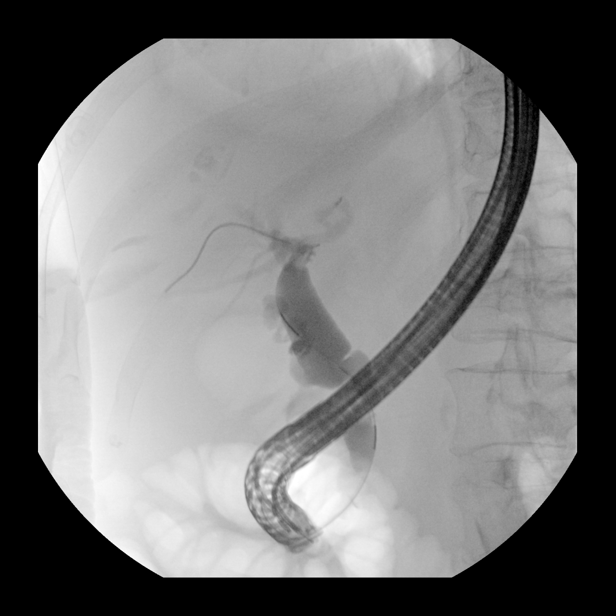
[im 3/7]
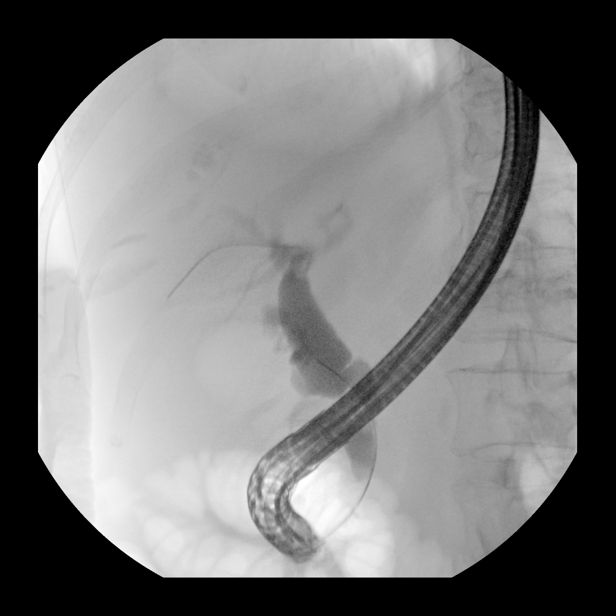
[im 4/7]
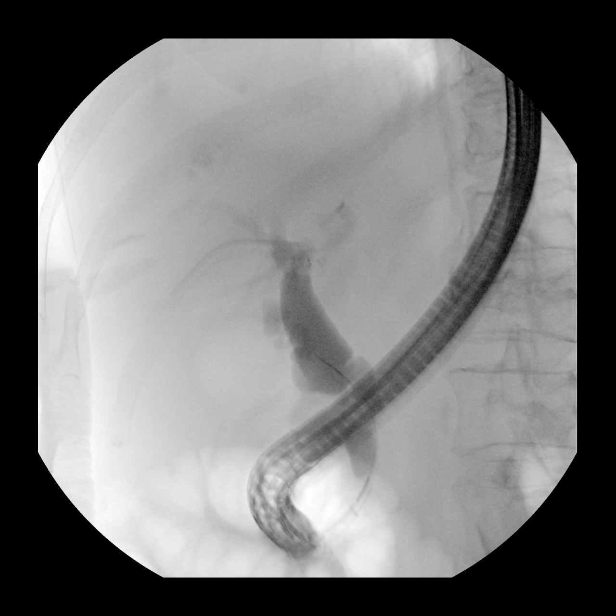
[im 4/7]
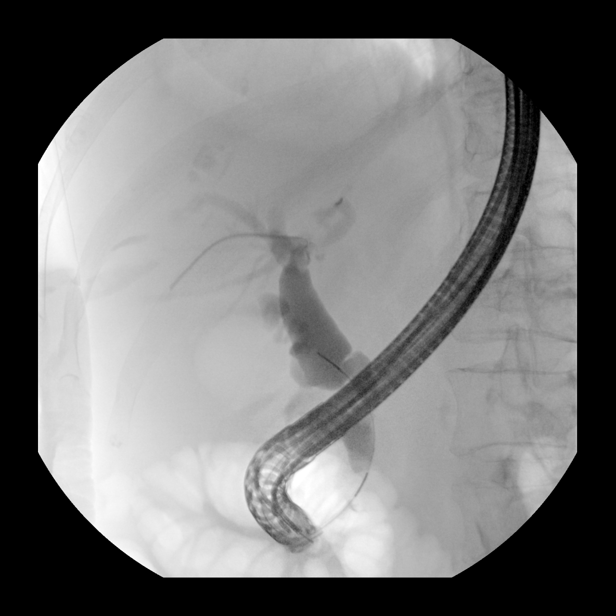
[im 5/7]
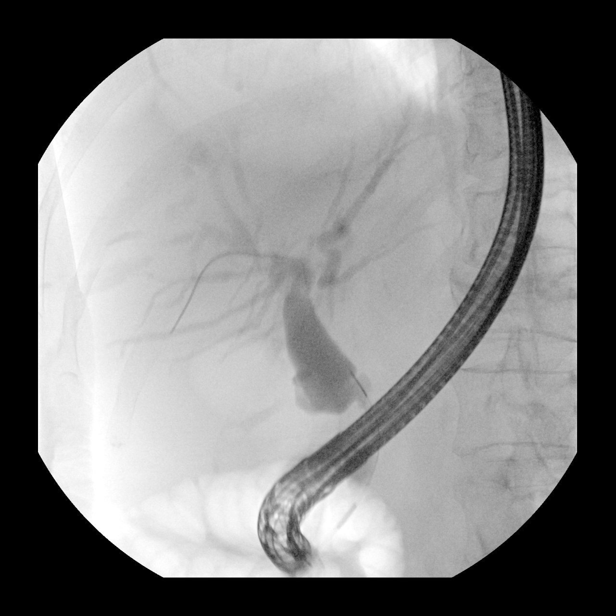
[im 6/7]
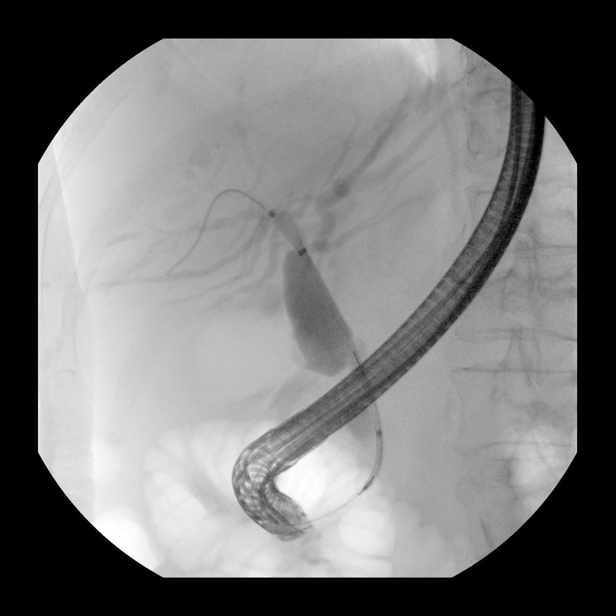
[im 6/7]
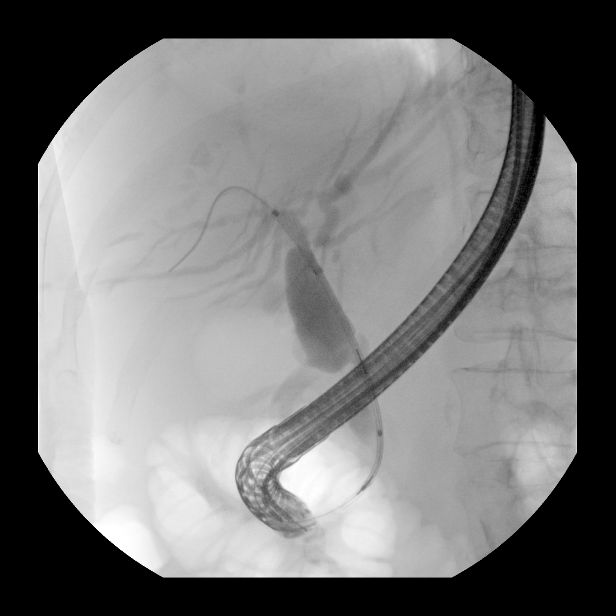
[im 6/7]
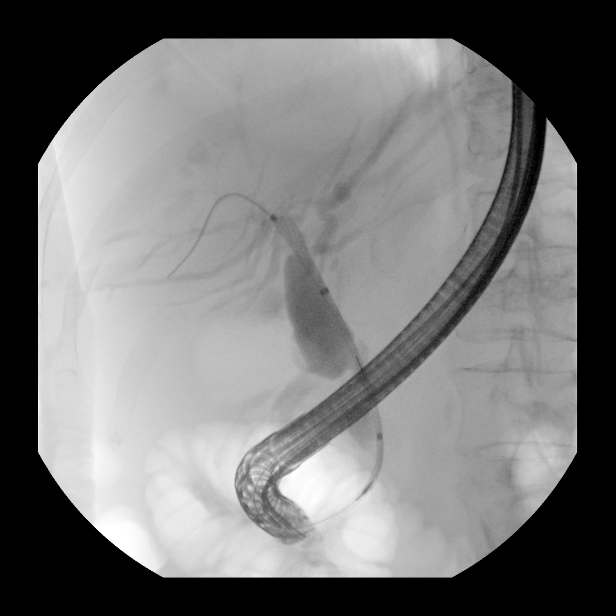
[im 7/7]
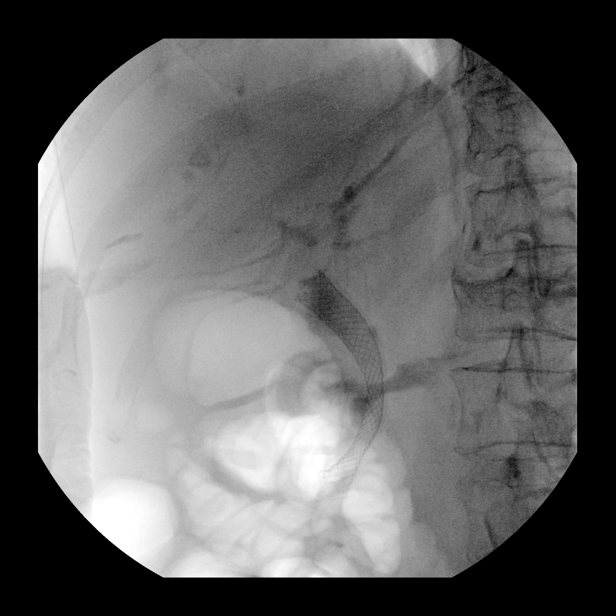

[15 of 20 positions shown; findings below may reference images not displayed]

FINDINGS: A total of 7 intraoperative saved images are submitted for review.
The images demonstrate a flexible duodenal scope in the descending
duodenum with wire cannulation of the intrahepatic ducts.
Cholangiography demonstrates marked dilation of the extrahepatic
biliary tree with focal high-grade stenosis versus occlusion of the
distal most common bile duct. Final image demonstrates placement of
a self expanding metallic biliary stent.
IMPRESSION: 1. High-grade stenosis versus occlusion of the distal most common
bile duct.
2. ERCP with placement of a self expanding metallic biliary stent.

These images were submitted for radiologic interpretation only.
Please see the procedural report for the amount of contrast and the
fluoroscopy time utilized.
